# Patient Record
Sex: Female | Born: 1960 | Race: Black or African American | Hispanic: No | Marital: Single | Smoking: Current every day smoker
Health system: Southern US, Community
[De-identification: ages and names within clinical notes are randomized; demographics above are authoritative.]

## PROBLEM LIST (undated history)

## (undated) ENCOUNTER — Emergency Department (HOSPITAL_BASED_OUTPATIENT_CLINIC_OR_DEPARTMENT_OTHER)

## (undated) DIAGNOSIS — D571 Sickle-cell disease without crisis: Secondary | ICD-10-CM

## (undated) DIAGNOSIS — J449 Chronic obstructive pulmonary disease, unspecified: Secondary | ICD-10-CM

## (undated) DIAGNOSIS — I6529 Occlusion and stenosis of unspecified carotid artery: Secondary | ICD-10-CM

## (undated) DIAGNOSIS — E785 Hyperlipidemia, unspecified: Secondary | ICD-10-CM

## (undated) DIAGNOSIS — I10 Essential (primary) hypertension: Secondary | ICD-10-CM

## (undated) DIAGNOSIS — D573 Sickle-cell trait: Secondary | ICD-10-CM

## (undated) DIAGNOSIS — J302 Other seasonal allergic rhinitis: Secondary | ICD-10-CM

## (undated) DIAGNOSIS — T7840XA Allergy, unspecified, initial encounter: Secondary | ICD-10-CM

## (undated) DIAGNOSIS — I639 Cerebral infarction, unspecified: Secondary | ICD-10-CM

## (undated) DIAGNOSIS — M1990 Unspecified osteoarthritis, unspecified site: Secondary | ICD-10-CM

## (undated) HISTORY — DX: Unspecified osteoarthritis, unspecified site: M19.90

## (undated) HISTORY — DX: Allergy, unspecified, initial encounter: T78.40XA

## (undated) HISTORY — DX: Occlusion and stenosis of unspecified carotid artery: I65.29

## (undated) HISTORY — PX: ABDOMINAL HYSTERECTOMY: SHX81

## (undated) HISTORY — DX: Hyperlipidemia, unspecified: E78.5

## (undated) HISTORY — DX: Cerebral infarction, unspecified: I63.9

## (undated) HISTORY — DX: Sickle-cell disease without crisis: D57.1

## (undated) HISTORY — DX: Other seasonal allergic rhinitis: J30.2

## (undated) HISTORY — DX: Sickle-cell trait: D57.3

## (undated) HISTORY — DX: Essential (primary) hypertension: I10

---

## 1997-08-17 ENCOUNTER — Encounter: Admission: RE | Admit: 1997-08-17 | Discharge: 1997-08-17 | Payer: Self-pay | Admitting: Obstetrics

## 1998-02-01 ENCOUNTER — Encounter: Admission: RE | Admit: 1998-02-01 | Discharge: 1998-02-01 | Payer: Self-pay | Admitting: Obstetrics

## 1998-10-11 ENCOUNTER — Encounter: Admission: RE | Admit: 1998-10-11 | Discharge: 1998-10-11 | Payer: Self-pay | Admitting: Obstetrics

## 1998-12-19 ENCOUNTER — Emergency Department (HOSPITAL_COMMUNITY): Admission: EM | Admit: 1998-12-19 | Discharge: 1998-12-19 | Payer: Self-pay | Admitting: Emergency Medicine

## 1999-05-02 ENCOUNTER — Encounter: Admission: RE | Admit: 1999-05-02 | Discharge: 1999-05-02 | Payer: Self-pay | Admitting: Obstetrics

## 1999-05-19 ENCOUNTER — Inpatient Hospital Stay (HOSPITAL_COMMUNITY): Admission: AD | Admit: 1999-05-19 | Discharge: 1999-05-19 | Payer: Self-pay | Admitting: Obstetrics & Gynecology

## 1999-05-23 ENCOUNTER — Encounter: Admission: RE | Admit: 1999-05-23 | Discharge: 1999-05-23 | Payer: Self-pay | Admitting: Obstetrics

## 1999-05-30 ENCOUNTER — Ambulatory Visit (HOSPITAL_COMMUNITY): Admission: RE | Admit: 1999-05-30 | Discharge: 1999-05-30 | Payer: Self-pay | Admitting: Obstetrics & Gynecology

## 2000-01-01 ENCOUNTER — Emergency Department (HOSPITAL_COMMUNITY): Admission: EM | Admit: 2000-01-01 | Discharge: 2000-01-01 | Payer: Self-pay | Admitting: Emergency Medicine

## 2000-01-03 ENCOUNTER — Emergency Department (HOSPITAL_COMMUNITY): Admission: EM | Admit: 2000-01-03 | Discharge: 2000-01-03 | Payer: Self-pay | Admitting: Emergency Medicine

## 2000-06-30 ENCOUNTER — Encounter: Admission: RE | Admit: 2000-06-30 | Discharge: 2000-08-10 | Payer: Self-pay | Admitting: Family Medicine

## 2000-10-27 ENCOUNTER — Emergency Department (HOSPITAL_COMMUNITY): Admission: EM | Admit: 2000-10-27 | Discharge: 2000-10-27 | Payer: Self-pay | Admitting: Emergency Medicine

## 2000-10-27 ENCOUNTER — Encounter: Payer: Self-pay | Admitting: Emergency Medicine

## 2001-08-12 ENCOUNTER — Ambulatory Visit (HOSPITAL_COMMUNITY): Admission: RE | Admit: 2001-08-12 | Discharge: 2001-08-12 | Payer: Self-pay | Admitting: *Deleted

## 2001-08-12 ENCOUNTER — Encounter: Payer: Self-pay | Admitting: Obstetrics

## 2002-05-23 ENCOUNTER — Emergency Department (HOSPITAL_COMMUNITY): Admission: EM | Admit: 2002-05-23 | Discharge: 2002-05-23 | Payer: Self-pay | Admitting: Emergency Medicine

## 2002-05-23 ENCOUNTER — Encounter: Payer: Self-pay | Admitting: Emergency Medicine

## 2002-11-30 ENCOUNTER — Encounter: Admission: RE | Admit: 2002-11-30 | Discharge: 2002-11-30 | Payer: Self-pay | Admitting: Internal Medicine

## 2002-12-06 ENCOUNTER — Other Ambulatory Visit: Admission: RE | Admit: 2002-12-06 | Discharge: 2002-12-06 | Payer: Self-pay | Admitting: Obstetrics and Gynecology

## 2002-12-06 ENCOUNTER — Encounter (INDEPENDENT_AMBULATORY_CARE_PROVIDER_SITE_OTHER): Payer: Self-pay

## 2002-12-06 ENCOUNTER — Encounter: Admission: RE | Admit: 2002-12-06 | Discharge: 2002-12-06 | Payer: Self-pay | Admitting: Obstetrics and Gynecology

## 2002-12-10 ENCOUNTER — Ambulatory Visit (HOSPITAL_COMMUNITY): Admission: RE | Admit: 2002-12-10 | Discharge: 2002-12-10 | Payer: Self-pay | Admitting: Internal Medicine

## 2002-12-10 ENCOUNTER — Encounter: Payer: Self-pay | Admitting: Internal Medicine

## 2002-12-13 ENCOUNTER — Encounter: Payer: Self-pay | Admitting: Internal Medicine

## 2002-12-13 ENCOUNTER — Ambulatory Visit (HOSPITAL_COMMUNITY): Admission: RE | Admit: 2002-12-13 | Discharge: 2002-12-13 | Payer: Self-pay | Admitting: Obstetrics and Gynecology

## 2003-01-04 ENCOUNTER — Encounter: Admission: RE | Admit: 2003-01-04 | Discharge: 2003-01-04 | Payer: Self-pay | Admitting: Internal Medicine

## 2003-02-01 ENCOUNTER — Encounter: Payer: Self-pay | Admitting: Emergency Medicine

## 2003-02-01 ENCOUNTER — Inpatient Hospital Stay (HOSPITAL_COMMUNITY): Admission: EM | Admit: 2003-02-01 | Discharge: 2003-02-02 | Payer: Self-pay | Admitting: Internal Medicine

## 2003-08-02 ENCOUNTER — Emergency Department (HOSPITAL_COMMUNITY): Admission: EM | Admit: 2003-08-02 | Discharge: 2003-08-02 | Payer: Self-pay | Admitting: Emergency Medicine

## 2005-01-06 ENCOUNTER — Emergency Department (HOSPITAL_COMMUNITY): Admission: EM | Admit: 2005-01-06 | Discharge: 2005-01-06 | Payer: Self-pay | Admitting: *Deleted

## 2005-10-02 ENCOUNTER — Emergency Department (HOSPITAL_COMMUNITY): Admission: EM | Admit: 2005-10-02 | Discharge: 2005-10-03 | Payer: Self-pay | Admitting: Emergency Medicine

## 2006-01-07 ENCOUNTER — Ambulatory Visit: Payer: Self-pay | Admitting: Hospitalist

## 2006-02-18 ENCOUNTER — Ambulatory Visit (HOSPITAL_COMMUNITY): Admission: RE | Admit: 2006-02-18 | Discharge: 2006-02-18 | Payer: Self-pay | Admitting: Obstetrics and Gynecology

## 2006-05-20 ENCOUNTER — Emergency Department (HOSPITAL_COMMUNITY): Admission: EM | Admit: 2006-05-20 | Discharge: 2006-05-20 | Payer: Self-pay | Admitting: *Deleted

## 2006-07-09 ENCOUNTER — Emergency Department (HOSPITAL_COMMUNITY): Admission: EM | Admit: 2006-07-09 | Discharge: 2006-07-10 | Payer: Self-pay | Admitting: Emergency Medicine

## 2006-08-06 ENCOUNTER — Ambulatory Visit: Payer: Self-pay | Admitting: Internal Medicine

## 2006-08-06 ENCOUNTER — Encounter (INDEPENDENT_AMBULATORY_CARE_PROVIDER_SITE_OTHER): Payer: Self-pay | Admitting: Unknown Physician Specialty

## 2006-08-06 DIAGNOSIS — F329 Major depressive disorder, single episode, unspecified: Secondary | ICD-10-CM | POA: Insufficient documentation

## 2006-08-06 DIAGNOSIS — J449 Chronic obstructive pulmonary disease, unspecified: Secondary | ICD-10-CM | POA: Insufficient documentation

## 2006-10-02 ENCOUNTER — Emergency Department (HOSPITAL_COMMUNITY): Admission: EM | Admit: 2006-10-02 | Discharge: 2006-10-02 | Payer: Self-pay | Admitting: Emergency Medicine

## 2006-11-29 ENCOUNTER — Emergency Department (HOSPITAL_COMMUNITY): Admission: EM | Admit: 2006-11-29 | Discharge: 2006-11-29 | Payer: Self-pay | Admitting: Family Medicine

## 2006-12-26 ENCOUNTER — Emergency Department (HOSPITAL_COMMUNITY): Admission: EM | Admit: 2006-12-26 | Discharge: 2006-12-26 | Payer: Self-pay | Admitting: Emergency Medicine

## 2007-03-26 ENCOUNTER — Emergency Department (HOSPITAL_COMMUNITY): Admission: EM | Admit: 2007-03-26 | Discharge: 2007-03-27 | Payer: Self-pay | Admitting: Emergency Medicine

## 2007-04-22 ENCOUNTER — Encounter: Payer: Self-pay | Admitting: Internal Medicine

## 2007-04-22 ENCOUNTER — Ambulatory Visit: Payer: Self-pay | Admitting: Internal Medicine

## 2007-04-22 DIAGNOSIS — F17200 Nicotine dependence, unspecified, uncomplicated: Secondary | ICD-10-CM

## 2007-04-22 DIAGNOSIS — IMO0002 Reserved for concepts with insufficient information to code with codable children: Secondary | ICD-10-CM | POA: Insufficient documentation

## 2007-05-02 DIAGNOSIS — E785 Hyperlipidemia, unspecified: Secondary | ICD-10-CM | POA: Insufficient documentation

## 2007-05-02 LAB — CONVERTED CEMR LAB
BUN: 13 mg/dL (ref 6–23)
CO2: 23 meq/L (ref 19–32)
Chloride: 106 meq/L (ref 96–112)
Glucose, Bld: 93 mg/dL (ref 70–99)
LDL Cholesterol: 178 mg/dL — ABNORMAL HIGH (ref 0–99)
Potassium: 4.3 meq/L (ref 3.5–5.3)
VLDL: 42 mg/dL — ABNORMAL HIGH (ref 0–40)

## 2007-05-04 ENCOUNTER — Encounter: Payer: Self-pay | Admitting: Internal Medicine

## 2007-05-04 ENCOUNTER — Ambulatory Visit (HOSPITAL_COMMUNITY): Admission: RE | Admit: 2007-05-04 | Discharge: 2007-05-04 | Payer: Self-pay | Admitting: Internal Medicine

## 2007-06-03 ENCOUNTER — Ambulatory Visit: Payer: Self-pay | Admitting: Infectious Disease

## 2007-06-24 ENCOUNTER — Emergency Department (HOSPITAL_COMMUNITY): Admission: EM | Admit: 2007-06-24 | Discharge: 2007-06-24 | Payer: Self-pay | Admitting: Family Medicine

## 2007-08-06 ENCOUNTER — Ambulatory Visit: Payer: Self-pay | Admitting: *Deleted

## 2007-08-06 DIAGNOSIS — M6530 Trigger finger, unspecified finger: Secondary | ICD-10-CM | POA: Insufficient documentation

## 2007-08-06 DIAGNOSIS — M25559 Pain in unspecified hip: Secondary | ICD-10-CM | POA: Insufficient documentation

## 2010-01-14 ENCOUNTER — Ambulatory Visit: Payer: Self-pay | Admitting: Internal Medicine

## 2010-01-14 DIAGNOSIS — N898 Other specified noninflammatory disorders of vagina: Secondary | ICD-10-CM | POA: Insufficient documentation

## 2010-01-21 DIAGNOSIS — E871 Hypo-osmolality and hyponatremia: Secondary | ICD-10-CM | POA: Insufficient documentation

## 2010-01-21 LAB — CONVERTED CEMR LAB
ALT: 10 units/L (ref 0–35)
AST: 13 units/L (ref 0–37)
Albumin: 4.3 g/dL (ref 3.5–5.2)
Calcium: 9.5 mg/dL (ref 8.4–10.5)
Chloride: 93 meq/L — ABNORMAL LOW (ref 96–112)
HDL: 42 mg/dL (ref >39)
MCHC: 33.9 g/dL (ref 30.0–36.0)
Platelets: 305 10*3/uL (ref 150–400)
Potassium: 3.7 meq/L (ref 3.5–5.3)
RDW: 14.4 % (ref 11.5–15.5)
Sodium: 127 meq/L — ABNORMAL LOW (ref 135–145)
Triglycerides: 159 mg/dL — ABNORMAL HIGH (ref <150)

## 2010-02-20 ENCOUNTER — Ambulatory Visit (HOSPITAL_COMMUNITY)
Admission: RE | Admit: 2010-02-20 | Discharge: 2010-02-20 | Payer: Self-pay | Source: Home / Self Care | Attending: Internal Medicine | Admitting: Internal Medicine

## 2010-03-02 ENCOUNTER — Telehealth: Payer: Self-pay | Admitting: Internal Medicine

## 2010-03-04 ENCOUNTER — Ambulatory Visit
Admission: RE | Admit: 2010-03-04 | Discharge: 2010-03-04 | Payer: Self-pay | Source: Home / Self Care | Attending: Internal Medicine | Admitting: Internal Medicine

## 2010-03-04 LAB — CONVERTED CEMR LAB
CO2: 23 meq/L (ref 19–32)
Calcium: 9.6 mg/dL (ref 8.4–10.5)
Chloride: 108 meq/L (ref 96–112)
Creatinine, Ser: 0.73 mg/dL (ref 0.40–1.20)
Glucose, Bld: 77 mg/dL (ref 70–99)
Sodium: 140 meq/L (ref 135–145)
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
## 2010-03-26 NOTE — Letter (Signed)
Summary: MAMMOGRAM SCREENING  MAMMOGRAM SCREENING   Imported By: Margie Billet 05/22/2009 10:58:00  _____________________________________________________________________  External Attachment:    Type:   Image     Comment:   External Document  Appended Document: MAMMOGRAM SCREENING    Clinical Lists Changes  Observations: Added new observation of MAMMOGRAM: No specific mammographic evidence of malignancy.  Assessment: BIRADS 1.  (05/13/2002 17:38)       Mammogram  Procedure date:  05/13/2002  Findings:      No specific mammographic evidence of malignancy.  Assessment: BIRADS 1.    Mammogram  Procedure date:  05/13/2002  Findings:      No specific mammographic evidence of malignancy.  Assessment: BIRADS 1.

## 2010-03-28 NOTE — Progress Notes (Signed)
  Phone Note Outgoing Call   Summary of Call: Call her about her normal mammogram. Also asked her to go to [facility] for BMET because she has hyponatremia in 12/2009. She said she will go to lab next Monday and no any c/o. She also run out of her statin and wants to refill. Initial call taken by: Jackson Latino MD,  March 02, 2010 6:35 PM    Prescriptions: ZOCOR 20 MG  TABS (SIMVASTATIN) Take 1 tablet by mouth once a day  #30 x 11   Entered and Authorized by:   Jackson Latino MD   Signed by:   Jackson Latino MD on 03/02/2010   Method used:   Electronically to        [facility] (retail)       [address]       [place], [place]  [address]       Ph: [phone]       Fax: [phone]   RxID:   [id number]

## 2010-03-28 NOTE — Miscellaneous (Signed)
Summary: Orders Update  Clinical Lists Changes  Orders: Added new Test order of T-Basic Metabolic Panel [phone]) - Signed     Process Orders Check Orders Results:     [facility]: ABN not required for this insurance Order queued for requisitioning for [facility]: March 04, 2010 11:30 AM Tests Sent for requisitioning (March 04, 2010 10:13 PM):     03/04/2010: [facility] -- T-Basic Metabolic Panel [phone] (signed)

## 2010-03-29 ENCOUNTER — Telehealth: Payer: Self-pay | Admitting: *Deleted

## 2010-03-29 NOTE — Telephone Encounter (Signed)
Pt calls and states she thinks she is having a reaction to her cholesterol medicine zocor, she states for 2 days that she has had muscle aches and a fine rash over her body. She is advised to go to urg care for eval asap. She states she is stopping the med and she will go to [place] [facility].

## 2010-04-01 NOTE — Telephone Encounter (Signed)
Hi [name],   I agreed with the plan for her to go to urgent care and stop zocor. Please make her an appointment sooner in [facility] for further evaluation.   Thanks

## 2010-04-26 ENCOUNTER — Encounter: Payer: Self-pay | Admitting: Internal Medicine

## 2010-05-02 ENCOUNTER — Ambulatory Visit (INDEPENDENT_AMBULATORY_CARE_PROVIDER_SITE_OTHER): Payer: Self-pay

## 2010-05-02 ENCOUNTER — Inpatient Hospital Stay (INDEPENDENT_AMBULATORY_CARE_PROVIDER_SITE_OTHER)
Admission: RE | Admit: 2010-05-02 | Discharge: 2010-05-02 | Disposition: A | Discharge status: Home / Self Care | Payer: Self-pay | Source: Ambulatory Visit | Attending: Family Medicine | Admitting: Family Medicine

## 2010-05-02 DIAGNOSIS — IMO0002 Reserved for concepts with insufficient information to code with codable children: Secondary | ICD-10-CM

## 2010-05-08 ENCOUNTER — Ambulatory Visit (INDEPENDENT_AMBULATORY_CARE_PROVIDER_SITE_OTHER): Payer: Self-pay | Admitting: Internal Medicine

## 2010-05-08 ENCOUNTER — Encounter: Payer: Self-pay | Admitting: Internal Medicine

## 2010-05-08 VITALS — BP 127/80 | HR 65 | Temp 97.3°F | Ht 65.0 in | Wt 183.6 lb

## 2010-05-08 DIAGNOSIS — E785 Hyperlipidemia, unspecified: Secondary | ICD-10-CM

## 2010-05-08 DIAGNOSIS — J449 Chronic obstructive pulmonary disease, unspecified: Secondary | ICD-10-CM

## 2010-05-08 DIAGNOSIS — Z0000 Encounter for general adult medical examination without abnormal findings: Secondary | ICD-10-CM | POA: Insufficient documentation

## 2010-05-08 MED ORDER — ASPIRIN EC 81 MG PO TBEC: 81.0000 mg | DELAYED_RELEASE_TABLET | Freq: Every day | ORAL | Status: DC

## 2010-05-08 MED ORDER — PRAVASTATIN SODIUM 20 MG PO TABS: 20.0000 mg | ORAL_TABLET | Freq: Every evening | ORAL | Status: DC

## 2010-05-08 NOTE — Progress Notes (Signed)
  Subjective:    Patient ID: [name], female    DOB: 03/09/1960, 50 y.o.   MRN: [id number]  HPI  Pt is a 50 yo AAF with PMH of HLD and COPD who came here for regular f/u. She has HLD with LDL 221 in 12/2009 and started zocor and she developed rashes in her four extremities after taking 4 weeks, also had muscle tightness, so she stopped it and the rash resolved in 2 weeks. Now she has no symptoms, including weakness, muscle pain or abdominal pain. She also wants to get screening colonoscopy and will have Pap smear next month at [facility] hospital.    Review of Systems Per HPI.  Current Outpatient Prescriptions  Medication Sig Dispense Refill  . albuterol (VENTOLIN HFA) 108 (90 BASE) MCG/ACT inhaler Inhale into the lungs every 4 (four) hours as needed.        . simvastatin (ZOCOR) 20 MG tablet Take 20 mg by mouth daily.          Allergies Metronidazole  No past medical history on file.  Past Surgical History  Procedure Date  . Abdominal hysterectomy        Objective:   Physical Exam General: Vital signs reviewed and noted. Well-developed,well-nourished,in no acute distress; alert,appropriate and cooperative throughout examination. Head: normocephalic, atraumatic. Neck: No deformities, masses, or tenderness noted. Lungs: Normal respiratory effort. Clear to auscultation BL without crackles or wheezes.  Heart: RRR. S1 and S2 normal without gallop, murmur, or rubs.  Abdomen: BS normoactive. Soft, Nondistended, non-tender.  No masses or organomegaly. Extremities: No pretibial edema.         Assessment & Plan:

## 2010-05-08 NOTE — Assessment & Plan Note (Addendum)
Her Pap smear is due and will do it in [facility] hospital soon. Will will have GI referral for screening colonoscopy. Start ASA for prevention.

## 2010-05-08 NOTE — Assessment & Plan Note (Signed)
She has side effects to zocor and will try low dose pravastatin and also advised her to exercise and weight loss as well as healthy diet. She is interested to do it. Will recheck FLP in 3-4 months if tolerates pravastatin.

## 2010-05-08 NOTE — Assessment & Plan Note (Signed)
Has no SOB and just use it as needed.

## 2010-05-08 NOTE — Patient Instructions (Signed)
Please take all your medications as instructed in your instructions.   Please call the Clinic for appointment if you have rashes or muscle pain to pravastatin.

## 2010-06-05 ENCOUNTER — Encounter: Payer: Self-pay | Admitting: Obstetrics and Gynecology

## 2010-06-05 DIAGNOSIS — Z01419 Encounter for gynecological examination (general) (routine) without abnormal findings: Secondary | ICD-10-CM

## 2010-06-05 DIAGNOSIS — IMO0002 Reserved for concepts with insufficient information to code with codable children: Secondary | ICD-10-CM

## 2010-06-06 NOTE — Progress Notes (Signed)
[name], [name] NO.:  [id number]  MEDICAL RECORD NO.:  [id number]           PATIENT TYPE:  A  LOCATION:  [facility]                   FACILITY:  [facility]  PHYSICIAN:  Trystian Crisanto Christy Gentles, CNM       DATE OF BIRTH:  1960-12-17  DATE OF SERVICE:  06/05/2010                                 CLINIC NOTE  REASON FOR VISIT:  Annual GYN exam.  HISTORY:  This is the initial visit at our clinic for 50 year old, G4, P1-0-3-1, who is post hysterectomy done in 1998 for fibroids and has a chief concern today of dyspareunia with some scant bright red blood after intercourse with condoms used.  She has not been sexually active for the matter of years until she has a recent new partner and has had a few instances of bleeding after intercourse.  She never needs to wear a pad or has persistent bleeding.  She has no pain or itching.  No abnormal or malodorous discharge.  She does say that she has vaginal dryness and has been using [place] with a condoms for that reason.  ALLERGIES:  FLAGYL.  CURRENT MEDICATIONS:  None except occasional Aleve and she does take a fish oil capsule every day.  She has calcium, but does not take it.  HEALTH CARE MAINTENANCE:  Tetanus and immunizations up-to-date. Mammogram was normal February 2012.  She has not had a colonoscopy yet, but her PMD which is [facility] has recommended one and will be scheduling once.  She is not getting regular dental care, but does not have concerns and does wash and brush teeth daily. She had a hysterectomy and left oophorectomy in 1998.  She states that was for fibroids and there is no cancer.  She had one term SVD and 3 SABs.  She denies any history of STIs and is unaware of having ever had ovarian cysts.  Her PMH is significant for hypercholesterolemia and she denies any history of hypertension.  SURGERY:  Hysterectomy as above.  SOCIAL HISTORY:  She has been unemployed for 2 years and is  stressed about that.  She lives alone.  She is a smoker and has 15-pack years and is somewhat motivated to quit.  She denies alcohol, any drug use, any history of IV drug use or other drugs and no history of abuse.  She has had one partner in the last year.  FAMILY HISTORY:  Positive for mother with diabetes, coronary vascular disease and hypertension as well as blood clots in legs.  REVIEW OF SYSTEMS:  Positive for some hot flashes, some palpitations, muscle and joint pain and as described above the dyspareunia.  PHYSICAL EXAMINATION:  VITAL SIGNS:  Temp 99, pulse 74, BP 137/93, recheck 148/82, weight 180, height 5 feet 5 inches. GENERAL:  WN, WD, pleasant, talkative AAF and NAD. HEAD:  Normocephalic.  Good dentition. NECK:  No thyromegaly.  Coronary RRR without murmur. LUNGS:  CTA bilateral. ABDOMEN:  Protuberant, soft, nontender and no masses.  Liver edge seems to be palpable at costal margins. PELVIC:  Normal external genitalia.  No lesions.  No bleeding noted. Mucosa does appear mildly atrophic  with pale pink coloration and very smooth without rugation's.  No lesions seen.  There is a minimal amount of inflammation at the cuff, but no granulation or lesion is noted. There is no bleeding with the speculum exam.  There is scant amount of physiologic white discharge and wet prep is sent.  Bimanual, no palpable lesions.  Unable to palpate right ovary due to body habitus, but there is no obvious masses or tenderness on exam.  IMPRESSION AND PLAN: 1. Obesity.  I discussed weight loss and diet and exercise. 2. Smoker.  I discussed smoking cessation and she is being referred to     [employer] at [facility] on smoking cessation     which includes nutrition and says she is interested in attending.     She is advised to start taking her calcium and she will be     following up with her primary physician about colonoscopy and other     routine health care maintenance  such as her cholesterol.          ______________________________ Caren Griffins, CNM    DP/MEDQ  D:  06/05/2010  T:  06/06/2010  Job:  [id number]

## 2010-07-12 NOTE — Group Therapy Note (Signed)
   [name], [name] NO.:  [id number]   MEDICAL RECORD NO.:  [id number]                   PATIENT TYPE:  OUT   LOCATION:  [facility]                           FACILITY:  [facility]   PHYSICIAN:  Argentina Donovan, MD                     DATE OF BIRTH:  04/02/1960   DATE OF SERVICE:  12/06/2002                                    CLINIC NOTE   HISTORY OF PRESENT ILLNESS:  The patient is a 50 year old black female  gravida 3, para 1-0-2-1 who had a hysterectomy and left salpingo-  oophorectomy in 1998 and has not had an examination since that time.  She is  in good health.  Is on no medication.  Has a normal blood pressure 106/68  and has no complaints.   PHYSICAL EXAMINATION:  BREASTS:  Symmetrical.  No masses.  NECK:  Thyroid is symmetrical with no masses.  PELVIC:  External genitalia is normal.  BUS within normal limits.  The  vagina is clean and well rugated.  The cervix is absent.  Pap smear is  taken.  Bimanual pelvic:  The ovary could not be palpated because of habitus  of patient.  SKIN:  The patient also had an irritating skin tag on the right lateral  chest wall which was injected underneath with a 1% Xylocaine and removed  with small bleeding point coagulated with silver nitrate and the tag sent  for pathologic diagnosis.   ASSESSMENT/PLAN:  The patient has been counseled to get a mammogram which is  scheduled in one week.  She does not need a Pap smear any longer as she did  not have hysterectomy because of cancer and she has been encouraged to get  her annual examinations to check on the one remaining ovary.                                               Argentina Donovan, MD    PR/MEDQ  D:  12/06/2002  T:  12/06/2002  Job:  [id number]

## 2010-07-12 NOTE — Discharge Summary (Signed)
[name], [name] NO.:  [id number]   MEDICAL RECORD NO.:  [id number]                   PATIENT TYPE:  INP   LOCATION:  5705                                 FACILITY:  [facility]   PHYSICIAN:  Blanch Media, M.D.             DATE OF BIRTH:  Feb 11, 1961   DATE OF ADMISSION:  02/01/2003  DATE OF DISCHARGE:  02/02/2003                                 DISCHARGE SUMMARY   PRIMARY CARE PHYSICIAN:  Laurell Roof, M.D., the [facility].   DISCHARGE DIAGNOSIS:  Acute pyelonephritis.   DISCHARGE MEDICATIONS:  Cipro 500 mg one tablet p.o. b.i.d. x 7 days.   CONDITION AT DISCHARGE:  Stable.   BRIEF ADMITTING HISTORY:  This is a 50 year old African American female with  no significant past medical history, who reports sharp left lower quadrant  and left back pain starting suddenly at 4 a.m. on the day of admission,  waking her up from sleep.  She was transported to the [facility] and had a CT scan of the abdomen done there.  Because her primary  care physician is at [facility], she was transferred to [facility] and  admitted to the floor.  The patient had been afebrile, but reported emesis  in the morning.  No prior nausea or vomiting.  No fevers or chills.  She  denied dysuria, but reported pressure on her bladder for three days.  No  hematuria.  No melena.  No hematochezia.  She reported unprotected  intercourse four nights before admission.   ALLERGIES:  1. FLAGYL.  2. EGGS.   PAST MEDICAL HISTORY:  1. Low back pain.  2. Reflux.  3. Constipation.  4. History of multiple UTIs as a child.  5. Positive for sickle cell trait.  6. Status post hysterectomy in 1998.   MEDICATIONS:  Aleve p.r.n.   SOCIAL HISTORY:  Current smoker of half of a pack per day for 28 years.  Occasional alcohol use.  No substance abuse.  The patient is single and  works as a Scientist, physiological at [employer].   FAMILY HISTORY:  Her mother had  diabetes, hypertension, and a history of  clots.  Her father died in an MCV at a young age.  She has two healthy  sisters and a 66 year old daughter who is also positive for sickle cell  trait.   REVIEW OF SYSTEMS:  As per HPI.   PHYSICAL EXAMINATION:  VITAL SIGNS:  On admission, temperature 97.5 degrees,  pulse 65, blood pressure 91/37, respirations 20, O2 saturation 92% on room  air.  GENERAL APPEARANCE:  No apparent distress.  HEENT:  The moist mucous membranes are moist.  The throat was clear.  PULMONARY:  The lungs are clear to auscultation bilaterally.  Good  respiratory effort.  HEART:  Regular rate and rhythm.  No murmurs, rubs, or gallops.  Pulses were  2+ and equal in the  upper and lower extremities.  ABDOMEN:  The abdomen was soft.  Decreased bowel sounds.  Mildly tender to  palpation in the left lower quadrant.  No flank pain.  No masses.  No  hepatosplenomegaly.  Mild suprapubic pain.  EXTREMITIES:  No cyanosis, clubbing, or edema.  Nontender.  GENITOURINARY:  The patient had mild discharge and mild left adnexal  tenderness.  SKIN:  Clear.  BACK:  The patient had left-sided lower back pain.  No lymphadenopathy.   ADMISSION LABORATORY DATA:  Sodium 140, potassium 3.7, chloride 110,  bicarbonate 25, BUN 7, creatinine 0.8, glucose 112.  White blood count 17.3,  hemoglobin 13.5, platelets 326.  ANC was 15.9 and MCV 86.  The UA was  cloudy, positive for blood, positive for nitrites, positive for leukocytes,  and positive for protein.  There were 21-50 white blood cells, 21-50 red  blood cells, many bacteria, and clumped white blood cells.  CT of the  abdomen showed no signs of diverticulitis, multiple diverticula, no renal  calculi, and no signs of hydronephrosis.   HOSPITAL COURSE:  The patient was admitted with suspected pyelonephritis and  started on p.o. ciprofloxacin after the patient's nausea and vomiting were  controlled with some Phenergan.  The patient did well.   She remained  afebrile overnight and was discharged in the morning with followup with her  primary care doctor.   DISCHARGE LABORATORY DATA:  Sodium 136, potassium 3.3, chloride 109,  bicarbonate 25, BUN 7, creatinine 0.8, glucose 98, calcium 8.4.  White blood  cell count 8.2, hemoglobin 11.7, hematocrit 34.8, platelet count 257.  Chlamydia probe was negative.  HIV antibody was nonreactive.      Lonia Blood, M.D.                         Blanch Media, M.D.    LG/MEDQ  D:  02/03/2003  T:  02/04/2003  Job:  [id number]

## 2010-07-16 ENCOUNTER — Ambulatory Visit (AMBULATORY_SURGERY_CENTER): Payer: Self-pay | Admitting: *Deleted

## 2010-07-16 VITALS — Ht 65.0 in | Wt 179.0 lb

## 2010-07-16 DIAGNOSIS — Z1211 Encounter for screening for malignant neoplasm of colon: Secondary | ICD-10-CM

## 2010-07-16 MED ORDER — PEG-KCL-NACL-NASULF-NA ASC-C 100 G PO SOLR: ORAL | Status: DC

## 2010-07-31 ENCOUNTER — Encounter: Payer: Self-pay | Admitting: Gastroenterology

## 2010-07-31 ENCOUNTER — Ambulatory Visit (AMBULATORY_SURGERY_CENTER): Payer: Self-pay | Admitting: Gastroenterology

## 2010-07-31 VITALS — BP 114/72 | HR 70 | Temp 97.9°F | Resp 16 | Ht 66.0 in | Wt 179.0 lb

## 2010-07-31 DIAGNOSIS — D126 Benign neoplasm of colon, unspecified: Secondary | ICD-10-CM

## 2010-07-31 DIAGNOSIS — Z1211 Encounter for screening for malignant neoplasm of colon: Secondary | ICD-10-CM

## 2010-07-31 DIAGNOSIS — K5730 Diverticulosis of large intestine without perforation or abscess without bleeding: Secondary | ICD-10-CM

## 2010-07-31 MED ORDER — SODIUM CHLORIDE 0.9 % IV SOLN: 500.0000 mL | INTRAVENOUS | Status: DC

## 2010-07-31 NOTE — Patient Instructions (Signed)
DISCHARGE INSTRUCTIONS REVIEWED WITH CAREPARTNER. SEE BLUE & GREEN SHEETS. INFORMATION ON POLYPS ,DIVERTICULOSIS, AND HIGH FIBER DIET GIVEN.

## 2010-08-01 ENCOUNTER — Telehealth: Payer: Self-pay | Admitting: *Deleted

## 2010-08-01 NOTE — Telephone Encounter (Signed)

## 2010-08-21 ENCOUNTER — Ambulatory Visit (INDEPENDENT_AMBULATORY_CARE_PROVIDER_SITE_OTHER): Payer: Self-pay | Admitting: Internal Medicine

## 2010-08-21 ENCOUNTER — Other Ambulatory Visit: Payer: Self-pay | Admitting: Internal Medicine

## 2010-08-21 ENCOUNTER — Ambulatory Visit (HOSPITAL_COMMUNITY)
Admission: RE | Admit: 2010-08-21 | Discharge: 2010-08-21 | Disposition: A | Discharge status: Home / Self Care | Payer: Self-pay | Source: Ambulatory Visit | Attending: Internal Medicine | Admitting: Internal Medicine

## 2010-08-21 ENCOUNTER — Encounter: Payer: Self-pay | Admitting: Internal Medicine

## 2010-08-21 DIAGNOSIS — Z0000 Encounter for general adult medical examination without abnormal findings: Secondary | ICD-10-CM

## 2010-08-21 DIAGNOSIS — E871 Hypo-osmolality and hyponatremia: Secondary | ICD-10-CM

## 2010-08-21 DIAGNOSIS — M25551 Pain in right hip: Secondary | ICD-10-CM

## 2010-08-21 DIAGNOSIS — M25559 Pain in unspecified hip: Secondary | ICD-10-CM

## 2010-08-21 DIAGNOSIS — E785 Hyperlipidemia, unspecified: Secondary | ICD-10-CM

## 2010-08-21 LAB — BASIC METABOLIC PANEL
Chloride: 106 mEq/L (ref 96–112)
Creat: 0.73 mg/dL (ref 0.50–1.10)
Potassium: 4.3 mEq/L (ref 3.5–5.3)

## 2010-08-21 LAB — LIPID PANEL
HDL: 39 mg/dL — ABNORMAL LOW (ref >39)
LDL Cholesterol: 216 mg/dL — ABNORMAL HIGH (ref 0–99)
Triglycerides: 209 mg/dL — ABNORMAL HIGH (ref <150)
VLDL: 42 mg/dL — ABNORMAL HIGH (ref 0–40)

## 2010-08-21 NOTE — Assessment & Plan Note (Signed)
Patient has history of hyperlipidemia. Last lipid panel in 12/2009. Patient was started on Zocor but she experienced significant allergy to that. She was asked to start pravastatin which did not. She is currently taking fish-oil tablet. She noted she would like to try with exercise. She is doing 2-3 times a week 30 minutes session of exercise. Will check lipid panel today for possibly change in management

## 2010-08-21 NOTE — Assessment & Plan Note (Signed)
Unclear etiology at this point. Patient has been evaluated in the past for bilateral hip pain. At that time normal lab work or imaging studies were performed. Conservative therapy was initiated. I will obtain today a right hip x-ray and refer the patient to sports medicine for further evaluation and management. No indication for pain medication at this point and patient is also reluctant to use long-term pain medication.

## 2010-08-21 NOTE — Assessment & Plan Note (Signed)
#  1 Patient's last mammogram was in 2010. This showed stable benign left breast nodule. No evidence of malignancy. Recommendation was to a mammogram in one year. We'll repeat mammogram today. #2 Pap smear: Patient had hysterectomy done. Nevertheless she was asked Pap smear done at the last office visit. She reports that she has been seen at the [facility]. She may have Pap smear. But the red ear and the chart after the patient left she did not receive a Pap smear. At the next office visit this should be reevaluated.

## 2010-08-21 NOTE — Progress Notes (Signed)
  Subjective:    Patient ID: [name], female    DOB: 05-29-1960, 50 y.o.   MRN: [id number]  HPI This is a 50 year old female with past medical history significant for hyperlipidemia, COPD presented to the clinic with right hip pain. The right hip pain started off couple of months ago it is at this point worsening. If it is an aching pain. In the morning she feels stiffness. Whenever she is sitting or standing for long time the pain is worse. After walking and doing exercise the pain improves but does not disappear. Occasionally she has to stop doing her exercise due to the pain. She reports that she had hip pain in the past never experienced it this bad. She denies any fevers or chills, trauma, insect bites. Denies any weakness, numbness or tingling. She noted that she would like to be checked for diabetes since she has strong history of diabetes in her family.   Review of Systems  Constitutional: Negative for fever, chills and fatigue.  Respiratory: Negative for chest tightness, shortness of breath and wheezing.   Cardiovascular: Negative for chest pain and palpitations.  Gastrointestinal: Negative for abdominal pain and abdominal distention.  Genitourinary: Negative for difficulty urinating.  Musculoskeletal: Positive for back pain, arthralgias and gait problem.  Neurological: Negative for dizziness, weakness and numbness.       Objective:   Physical Exam  Constitutional: She is oriented to person, place, and time. She appears well-developed.  HENT:  Head: Normocephalic.  Neck: Neck supple.  Cardiovascular: Normal rate and regular rhythm.   Pulmonary/Chest: Effort normal and breath sounds normal. No respiratory distress.  Abdominal: Soft. Bowel sounds are normal. She exhibits no distension.  Musculoskeletal:       Right hip: She exhibits decreased range of motion, tenderness, bony tenderness and swelling.       Legs: Neurological: She is alert and oriented to person, place,  and time. She has normal strength and normal reflexes. No cranial nerve deficit or sensory deficit. Gait normal.  Skin: No rash noted.          Assessment & Plan:

## 2010-08-22 ENCOUNTER — Telehealth: Payer: Self-pay | Admitting: Internal Medicine

## 2010-08-22 DIAGNOSIS — E785 Hyperlipidemia, unspecified: Secondary | ICD-10-CM

## 2010-08-22 MED ORDER — PRAVASTATIN SODIUM 20 MG PO TABS: 20.0000 mg | ORAL_TABLET | Freq: Every day | ORAL | Status: DC

## 2010-08-22 NOTE — Telephone Encounter (Signed)
Elevated Lipid noted. Will start pravastatin. Evaluate at the next office visit LFT and possible side effect since patient is allergic to Zocor.

## 2010-08-22 NOTE — Assessment & Plan Note (Signed)
Elevated Lipid noted. Will start pravastatin. Evaluate at the next office visit LFT and possible side effect since patient is allergic to Zocor.   Lab Results  Component Value Date   CHOL 297* 08/21/2010   CHOL 295* 01/14/2010   CHOL 254* 04/22/2007   Lab Results  Component Value Date   HDL 39* 08/21/2010   HDL 42 01/14/2010   HDL 34* 04/22/2007   Lab Results  Component Value Date   LDLCALC 216* 08/21/2010   LDLCALC 221* 01/14/2010   LDLCALC 178* 04/22/2007   Lab Results  Component Value Date   TRIG 209* 08/21/2010   TRIG 159* 01/14/2010   TRIG 210* 04/22/2007   Lab Results  Component Value Date   CHOLHDL 7.6 08/21/2010   CHOLHDL 7.0 Ratio 01/14/2010   CHOLHDL 7.5 Ratio 04/22/2007   No results found for this basename: LDLDIRECT

## 2010-09-03 ENCOUNTER — Ambulatory Visit: Payer: Self-pay | Admitting: Family Medicine

## 2010-09-04 ENCOUNTER — Telehealth: Payer: Self-pay | Admitting: *Deleted

## 2010-09-04 NOTE — Telephone Encounter (Signed)
Pt informed and will restart med.   Appointment made for next week in case sore throat is not resolved.  Pt will cancel if better.

## 2010-09-04 NOTE — Telephone Encounter (Signed)
LDL was 216 last month. She most definitely needs to resume the statin bc I think the benefits out weigh the possible risk. We have seen a lot of viral URI's recently so there is a "bug" going around. I suspect she got a virus rather than a side effect to statin.

## 2010-09-04 NOTE — Telephone Encounter (Signed)
Pt called stating she stared taking pravastatin  Last Monday 7/2, on Tuesday she developed a sore throat and flu like symptoms. She stopped pravastatin for a few days but still having a sore throat. Flu like symptom have gone away but she still has the sore throat.   She has been taking OTC pain med every day so is not sure if She would have a fever.  Should she stop pravastatin, she has taken Zocor in past but had side effects - 05/08/10. Please advise.

## 2010-09-06 ENCOUNTER — Encounter: Payer: Self-pay | Admitting: Family Medicine

## 2010-09-06 ENCOUNTER — Ambulatory Visit (INDEPENDENT_AMBULATORY_CARE_PROVIDER_SITE_OTHER): Payer: Self-pay | Admitting: Family Medicine

## 2010-09-06 VITALS — BP 130/84 | Ht 65.0 in | Wt 179.0 lb

## 2010-09-06 DIAGNOSIS — M25559 Pain in unspecified hip: Secondary | ICD-10-CM

## 2010-09-06 NOTE — Progress Notes (Signed)
  Subjective:    Patient ID: [name], female    DOB: 31-Oct-1960, 50 y.o.   MRN: [id number]  HPI  Right hip pain off and on for several years that has gotten significantly worse in the last 2 months. She has done a lot of extra walking in the last 2 months for exercise program. She is a Physicist, medical ill for college.  Pain is in the lateral hip and seems to radiate down the leg to right below her knee. It is worse after a lot of walking; after sitting for a while it he  is fairly stiff. Pain seems her from falling asleep easily at night. It is somewhat better in the morning.  PERTINENT  PMH / PSH: No prior hip injury or hip surgery. She has had some hip x-rays.  Review of Systems Denies fever or unusual weight change.    Objective:   Physical Exam    GENERAL: Well-developed female no acute distress HIP: Some decrease in internal rotation bilaterally with minimal pain at the far range of motion. External rotation is full. Hip flexor strength bilaterally is normal. Distally she is neurovascularly intact. She has normal muscle bulk and tone in the quadriceps. Negative straight leg raise bilaterally. Negative Trendelenburg. Some tenderness to palpation over the right greater trochanteric bursa and this reproduces some of her pain.  X-rays: Full views of the right hip plus pelvis x-ray were reviewed. She has some early arthritic changes with some osteophytes and sclerosis. There may be the beginning of formation of some acetabular cysts. The hip joint space appears to be maintained.  INJECTION: Patient was given informed consent, signed copy in the chart. Appropriate time out was taken. Area prepped and draped in usual sterile fashion. 1cc of kenalog plus  4 cc of lidocaine was injected into the right greater trochanteric bursa using a(n) lateral approach. The patient tolerated the procedure well. There were no complications. Post procedure instructions were given.     Assessment &  Plan:  Hip pain. I think this is multifactorial. She has some early arthritic change with some loss of internal range of motion. I suspect this is been going on for long while he think the more recent pain is related to greater trochanteric bursitis which we injected today. I will put her on a hip strengthening program and see her back in 3 weeks.

## 2010-09-08 ENCOUNTER — Emergency Department (HOSPITAL_COMMUNITY): Payer: Self-pay

## 2010-09-08 ENCOUNTER — Encounter: Payer: Self-pay | Admitting: Internal Medicine

## 2010-09-08 ENCOUNTER — Inpatient Hospital Stay (HOSPITAL_COMMUNITY)
Admission: EM | Admit: 2010-09-08 | Discharge: 2010-09-12 | DRG: 066 | Disposition: A | Discharge status: Home / Self Care | Payer: Self-pay | Attending: Neurology | Admitting: Neurology

## 2010-09-08 DIAGNOSIS — Y92009 Unspecified place in unspecified non-institutional (private) residence as the place of occurrence of the external cause: Secondary | ICD-10-CM

## 2010-09-08 DIAGNOSIS — I609 Nontraumatic subarachnoid hemorrhage, unspecified: Principal | ICD-10-CM | POA: Diagnosis present

## 2010-09-08 DIAGNOSIS — E876 Hypokalemia: Secondary | ICD-10-CM | POA: Diagnosis present

## 2010-09-08 DIAGNOSIS — K5900 Constipation, unspecified: Secondary | ICD-10-CM | POA: Diagnosis present

## 2010-09-08 DIAGNOSIS — J4489 Other specified chronic obstructive pulmonary disease: Secondary | ICD-10-CM | POA: Diagnosis present

## 2010-09-08 DIAGNOSIS — Z833 Family history of diabetes mellitus: Secondary | ICD-10-CM

## 2010-09-08 DIAGNOSIS — Z8249 Family history of ischemic heart disease and other diseases of the circulatory system: Secondary | ICD-10-CM

## 2010-09-08 DIAGNOSIS — I700 Atherosclerosis of aorta: Secondary | ICD-10-CM | POA: Diagnosis present

## 2010-09-08 DIAGNOSIS — D72829 Elevated white blood cell count, unspecified: Secondary | ICD-10-CM | POA: Diagnosis present

## 2010-09-08 DIAGNOSIS — T380X5A Adverse effect of glucocorticoids and synthetic analogues, initial encounter: Secondary | ICD-10-CM | POA: Diagnosis present

## 2010-09-08 DIAGNOSIS — R109 Unspecified abdominal pain: Secondary | ICD-10-CM | POA: Diagnosis present

## 2010-09-08 DIAGNOSIS — D573 Sickle-cell trait: Secondary | ICD-10-CM | POA: Diagnosis present

## 2010-09-08 DIAGNOSIS — I10 Essential (primary) hypertension: Secondary | ICD-10-CM | POA: Diagnosis present

## 2010-09-08 DIAGNOSIS — J449 Chronic obstructive pulmonary disease, unspecified: Secondary | ICD-10-CM | POA: Diagnosis present

## 2010-09-08 DIAGNOSIS — F17200 Nicotine dependence, unspecified, uncomplicated: Secondary | ICD-10-CM | POA: Diagnosis present

## 2010-09-08 DIAGNOSIS — I498 Other specified cardiac arrhythmias: Secondary | ICD-10-CM | POA: Diagnosis present

## 2010-09-08 DIAGNOSIS — E7800 Pure hypercholesterolemia, unspecified: Secondary | ICD-10-CM | POA: Diagnosis present

## 2010-09-08 DIAGNOSIS — K219 Gastro-esophageal reflux disease without esophagitis: Secondary | ICD-10-CM | POA: Diagnosis present

## 2010-09-08 DIAGNOSIS — F1210 Cannabis abuse, uncomplicated: Secondary | ICD-10-CM | POA: Diagnosis present

## 2010-09-08 DIAGNOSIS — E669 Obesity, unspecified: Secondary | ICD-10-CM | POA: Diagnosis present

## 2010-09-08 LAB — URINALYSIS, ROUTINE W REFLEX MICROSCOPIC
Glucose, UA: NEGATIVE mg/dL
Ketones, ur: NEGATIVE mg/dL
Protein, ur: 100 mg/dL — AB

## 2010-09-08 LAB — CARDIAC PANEL(CRET KIN+CKTOT+MB+TROPI): Troponin I: <0.3 ng/mL (ref <0.30)

## 2010-09-08 LAB — DIFFERENTIAL
Eosinophils Relative: 0 % (ref 0–5)
Lymphs Abs: 4.7 10*3/uL — ABNORMAL HIGH (ref 0.7–4.0)
Monocytes Relative: 4 % (ref 3–12)
Neutro Abs: 15.1 10*3/uL — ABNORMAL HIGH (ref 1.7–7.7)

## 2010-09-08 LAB — COMPREHENSIVE METABOLIC PANEL
ALT: 25 U/L (ref 0–35)
AST: 22 U/L (ref 0–37)
CO2: 21 mEq/L (ref 19–32)
Chloride: 104 mEq/L (ref 96–112)
GFR calc non Af Amer: >60 mL/min (ref >60)
Sodium: 141 mEq/L (ref 135–145)
Total Bilirubin: 0.2 mg/dL — ABNORMAL LOW (ref 0.3–1.2)

## 2010-09-08 LAB — POCT I-STAT, CHEM 8
Chloride: 109 mEq/L (ref 96–112)
Creatinine, Ser: 0.6 mg/dL (ref 0.50–1.10)
Glucose, Bld: 146 mg/dL — ABNORMAL HIGH (ref 70–99)
Hemoglobin: 16.7 g/dL — ABNORMAL HIGH (ref 12.0–15.0)
Potassium: 3.3 mEq/L — ABNORMAL LOW (ref 3.5–5.1)

## 2010-09-08 LAB — URINE MICROSCOPIC-ADD ON

## 2010-09-08 LAB — RAPID URINE DRUG SCREEN, HOSP PERFORMED: Benzodiazepines: NOT DETECTED

## 2010-09-08 LAB — TYPE AND SCREEN
ABO/RH(D): O POS
Antibody Screen: NEGATIVE

## 2010-09-08 LAB — CBC
HCT: 43.6 % (ref 36.0–46.0)
Hemoglobin: 15.9 g/dL — ABNORMAL HIGH (ref 12.0–15.0)
MCH: 30.6 pg (ref 26.0–34.0)
MCHC: 36.5 g/dL — ABNORMAL HIGH (ref 30.0–36.0)

## 2010-09-08 LAB — PROTIME-INR: Prothrombin Time: 12.7 seconds (ref 11.6–15.2)

## 2010-09-08 LAB — LACTIC ACID, PLASMA: Lactic Acid, Venous: 4.3 mmol/L — ABNORMAL HIGH (ref 0.5–2.2)

## 2010-09-08 LAB — ABO/RH: ABO/RH(D): O POS

## 2010-09-08 LAB — ETHANOL: Alcohol, Ethyl (B): <11 mg/dL (ref 0–11)

## 2010-09-08 LAB — APTT: aPTT: 26 seconds (ref 24–37)

## 2010-09-08 MED ORDER — GADOBENATE DIMEGLUMINE 529 MG/ML IV SOLN
20.0000 mL | Freq: Once | INTRAVENOUS | Status: AC | PRN
  Administered 2010-09-08: 20 mL via INTRAVENOUS

## 2010-09-08 MED ORDER — IOHEXOL 350 MG/ML SOLN
100.0000 mL | Freq: Once | INTRAVENOUS | Status: AC | PRN
  Administered 2010-09-08: 100 mL via INTRAVENOUS

## 2010-09-08 NOTE — H&P (Signed)
Medicine Consult Note Date: 09/08/2010  Patient name:  [name]  Medical record number:  [id number] Date of birth:  [birth year]  Age: 50 y.o. Gender:  female PCP:    Lorretta Harp, MD, MD  Medical Service:   Internal Medicine Teaching Service   Attending physician:  Dr. Aundria Rud First Contact:   Dr. Leatrice Jewels Pager:  [phone]  Pager:Second Contact:       Dr. Denton Meek Pager: [phone].      After Hours:    First Contact   Pager: [phone]      Second Contact  Pager: [phone]   Chief Complaint: Headache and stomach pain "since early this morning."  History of Present Illness: Patient is a 50 y.o. female who presents to [facility] for evaluation of an acute onset of headache, nausea, vomiting and abdominal pain of 6 hours duration.  Patient developed symptoms while taking a walk with her family member when developed an acute right upper quadrant abdominal pain, nausea, vomitting, diaphoresis and flushed skin with a gradual onset of a HA> She denies any blurry vision, weakness, speech deficit; CP, SOB, wheezing or pleuritic pain. She denies diarrhea and reports  A chronic constipation with a use of OTC laxatives. She had last BM the day prior to admission on 09/07/10 which was normal in caliber and color and consistency. She denies any fever or chills or dizziness; no recent sick c ontacts or travel or eating out. Patient denies any similar symptoms in the past.  Current Outpatient Medications: Fish Oil one tablet PO daily Pravastatin 20 mg PO qhs     Allergies: Intolerance to ASA _" gives her upset stomach and cuases palpitation." Past Medical History: Past Medical History  Diagnosis Date  . Sicke cell trait COPD with no PFT's on file GERD HLD Hx of chronic hypokalemia Hx of hysterectomy for uterine leiomyomas History of UTI's in childhood      Past Surgical History: Past Surgical History  Procedure Date  . Abdominal hysterectomy     Family History: Family History  Problem Relation Age  of Onset  . Hyperlipidemia Mother   . Hypertension Mother   . Diabetes Mother     Social History: History   Social History  . Marital Status: Single    Spouse Name: N/A    Number of Children: N/A  . Years of Education: N/A   Occupational History  . Not on file.   Social History Main Topics  . Smoking status: Current Everyday Smoker -- 0.5 packs/dayx 28 years  . Smokeless tobacco: Never Used  . Alcohol Use: denies  . Drug Use: denies  . Sexually Active: Not on file    Social History Narrative   Financial assistance approved for 100% discount at [facility] and has [facility] card per [place]1/19/2012    Review of Systems: Pertinent items are noted in HPI.  Vital Signs: T: 96.1 rectal P: 72 BP: 209/100 164/84 RR: 18 O2 sat: 97% RA   Physical Exam: General: Vital signs reviewed and noted. Well-developed, well-nourished, in no acute distress; alert, appropriate and cooperative throughout examination.  Head: Normocephalic, atraumatic.  Eyes: PERRL, EOMI, No signs of anemia or jaundince.  Nose: Mucous membranes moist, not inflammed, nonerythematous.  Throat: Oropharynx nonerythematous, no exudate appreciated.   Neck: No deformities, masses, or tenderness noted.Supple, No carotid Bruits, no JVD.  Lungs:  Normal respiratory effort. Clear to auscultation BL without crackles or wheezes.  Heart: RRR. S1 and S2 normal without gallop, murmur, or rubs.  Abdomen:  BS normoactive. Soft, Nondistended,slight RUQ TTP; Muprhy sign engative.  No masses or organomegaly.  Extremities: No pretibial edema.  Neurologic: A&O X3, CN II - XII are grossly intact. Motor strength is 5/5 in the all 4 extremities, Sensations intact to light touch, Cerebellar signs negative.  Skin: No visible rashes, scars.   Lab results: CBC:    Component Value Date/Time   WBC 20.6* 09/08/2010 1025   HGB 16.7* 09/08/2010 1048   HCT 49.0* 09/08/2010 1048   PLT 400 09/08/2010 1025   MCV 84.0 09/08/2010 1025   NEUTROABS  15.1* 09/08/2010 1025   LYMPHSABS 4.7* 09/08/2010 1025   MONOABS 0.8 09/08/2010 1025   EOSABS 0.0 09/08/2010 1025   BASOSABS 0.0 09/08/2010 1025      Comprehensive Metabolic Panel:    Component Value Date/Time   NA 142 09/08/2010 1048   K 3.3* 09/08/2010 1048   CL 109 09/08/2010 1048   CO2 21 09/08/2010 1025   BUN 11 09/08/2010 1048   CREATININE 0.60 09/08/2010 1048   CREATININE 0.73 08/21/2010 0954   GLUCOSE 146* 09/08/2010 1048   CALCIUM 9.8 09/08/2010 1025   AST 22 09/08/2010 1025   ALT 25 09/08/2010 1025   ALKPHOS 102 09/08/2010 1025   BILITOT 0.2* 09/08/2010 1025   PROT 8.2 09/08/2010 1025   ALBUMIN 3.9 09/08/2010 1025     Lab Results  Component Value Date   CKTOTAL 53 09/08/2010   TROPONINI <0.30 09/08/2010   Lactic acid: 4.3 Lipase 20    Imaging results:   MR brain with and w/o CM:  IMPRESSION:   Small amount of increased signal within the sulci at the vertex on   the right, consistent with the presence of a small amount of   subarachnoid hemorrhage in this location shown by CT.  There is no   other sign of intracranial hemorrhage by MRI.  The underlying brain   appears normal.    Few old small vessel insults within the cerebellum.  No acute   Infarct  CT of abdomen and pelvis with CM: IMPRESSION:   Negative for abdominal aortic dissection or aneurysm.    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  Concern   for stenosis at the origin of the left common iliac artery.    Question a trace amount of pelvic fluid.  CTA of chest: IMPRESSION:   Atherosclerosis of the aorta but no aneurysm or dissection.  Focal   calcification noted at the origin of the left main coronary artery.   This was not evaluated in detail.    Pronounced pulmonary emphysematous changes without acute pulmonary   pathology evident.   Assessment & Plan:  1. Acute Headache with nausea and vomiting with CT and MR  of brain positive for small subarachnoid bleed over right parietal lobe. Unclear etiology given no history of head trauma or falls. Patient does states having severe episode of nausea and emesis with wretching  That could have precipitated the event. Patient is also hypertensive in ED but has no known diagnosis of HTN and/or treatment. Hypertensive crisis could certainly, be a factor precipitating a subarachnoid bleed. Patient is stable and being managed by a Neurology team. 2. Acute abdominal pain in a setting of nausea, emesis, elevated WBC and lactic acidosis. Differential diagnoses include ischemic colitis which was ruled out with a negative CT scan of abdomen and pelvis (specificity of 95%). Atypical presentation of ACS was also considered. However,  CE x1 negative; EKG without any ST wave elevations or T wave inversions. CTA neg for aortic dissection and PE. Patient's LFT's and lipase are unremarkable ->therefore gallstone disease, pancreatitis are unlikely. Patient is currently pain free after one dose of Dilaudid given in ED. 3. Leukocytosis. Likely steri-induced de margination given recent history of the left hip steroid injection by Dr. Lloyd Huger at [facility]. However, given acute abdominal pain and lactic acidosis; infectious causes must be excluded despite lack of fever. UA done in ED is negative. Therefore UTI is unlikely. Will order Blood cultures x 2; and repeat WBC to evaluate for a trend. 4. Hypokalemia, likely due to emesis-induced volume contraction. It was repleted in ED. 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 Will discuss with an attending Dr Aundria Rud whether the patient need vascular work up/consult. 6. UDS positive for THCA. Counseling on PSA initiated. 7. Hx of COPD in a setting of ongoing tobacco abuse. Patient is stable. Counseled on smoking cessation. 8. DVT PPX -  SCD's.      (PGY1):          ____________________________________    Date/ Time:     ____________________________________     Deatra Robinson M.D. (PGY2):  ____________________________________    Date/ Time:     ____________________________________     I have seen and examined the patient. I reviewed the resident/fellow note and agree with the findings and plan of care as documented. My additions and revisions are included.   Signature:  ____________________________________________     Internal Medicine Teaching Service Attending    Date:    ____________________________________________

## 2010-09-09 ENCOUNTER — Other Ambulatory Visit (HOSPITAL_COMMUNITY): Payer: Self-pay

## 2010-09-09 ENCOUNTER — Inpatient Hospital Stay (HOSPITAL_COMMUNITY): Payer: Self-pay

## 2010-09-09 ENCOUNTER — Ambulatory Visit: Payer: Self-pay | Admitting: Internal Medicine

## 2010-09-09 LAB — LACTIC ACID, PLASMA: Lactic Acid, Venous: 1.7 mmol/L (ref 0.5–2.2)

## 2010-09-09 LAB — BASIC METABOLIC PANEL
GFR calc Af Amer: >60 mL/min (ref >60)
GFR calc non Af Amer: >60 mL/min (ref >60)
Potassium: 4.3 mEq/L (ref 3.5–5.1)
Sodium: 138 mEq/L (ref 135–145)

## 2010-09-09 LAB — DIFFERENTIAL
Basophils Absolute: 0 10*3/uL (ref 0.0–0.1)
Basophils Relative: 0 % (ref 0–1)
Eosinophils Relative: 0 % (ref 0–5)
Monocytes Absolute: 0.9 10*3/uL (ref 0.1–1.0)
Neutro Abs: 14.7 10*3/uL — ABNORMAL HIGH (ref 1.7–7.7)

## 2010-09-09 LAB — CBC
MCHC: 35.9 g/dL (ref 30.0–36.0)
RDW: 14.1 % (ref 11.5–15.5)
WBC: 19.2 10*3/uL — ABNORMAL HIGH (ref 4.0–10.5)

## 2010-09-09 LAB — HIV ANTIBODY (ROUTINE TESTING W REFLEX): HIV: NONREACTIVE

## 2010-09-09 LAB — CARDIAC PANEL(CRET KIN+CKTOT+MB+TROPI)
Relative Index: INVALID (ref 0.0–2.5)
Total CK: 46 U/L (ref 7–177)
Troponin I: <0.3 ng/mL (ref <0.30)

## 2010-09-09 LAB — SEDIMENTATION RATE: Sed Rate: 3 mm/hr (ref 0–22)

## 2010-09-09 NOTE — H&P (Signed)
NAMEHANIYA, FERN NO.:  [id number]  MEDICAL RECORD NO.:  [id number]  LOCATION:  3105                         FACILITY:  [facility]  PHYSICIAN:  Levie Heritage, MD       DATE OF BIRTH:  October 24, 1960  DATE OF ADMISSION:  09/08/2010 DATE OF DISCHARGE:                             HISTORY & PHYSICAL   REASON FOR CONSULTATION:  Headache.  CHIEF COMPLAINT:  Severe headache.  HISTORY:  A 50 year old African American woman who woke up this morning and went out for walking when she felt having severe headache and nauseous as well.  She was brought to the emergency department with two complaints, one was headache and the other was severe abdominal pain. She denies any neurological deficit at all.  She has vomited once in the emergency, and the morphine took care of her headache, and now she is at ease for her headache.  The ER Team is still working on evaluation for the abdominal pain and increased WBC counts.  PAST MEDICAL HISTORY:  She is known to have hypercholesterolemia, but no other health issues.  ALLERGIES:  She is allergic to FLAGYL.  PAST SURGICAL HISTORY:  Hysterectomy.  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  Denies alcohol or any other illicit drug abuse.  FAMILY HISTORY:  Mother with diabetes, coronary artery disease, and hypertension and DVTs.  No other health issues in the family.  REVIEW OF CLINICAL DATA:  I have reviewed her CT head performed today that does show very tiny right parietal subarachnoid blood.  Her CT angio of the abdomen has been read as thrombus for the inferior abdominal aorta and concern for stenosis at the region of the left common iliac artery.  LABORATORY DATA:  CBC is 20,000.  Urinalysis unremarkable.  CK-MB is normal.  CMP, there is mild increase in glucose and low in potassium. Hemoglobin is high at 16.7.  REVIEW OF SYSTEMS:  Denies any chest pain,  denies any shortness of breath, denies any sputum production, denies any recent fevers, denies any recent rashes.  No issues with the burning urination.  She does complain of having abdominal pain, but denies any other factor suspicion of ileus.  Denies any visual changes, no problem with the motor deficit or sensory issues.  Rest of 10-organ review of system unremarkable.  PHYSICAL EXAMINATION:  GENERAL:  Comfortably lying down in bed. VITAL SIGNS:  Range between systolic 172 to 201 mmHg and diastolic is 92 mmHg, pulse 74 per minute regular. NEUROLOGIC:  Awake, oriented x3 in no acute distress.  Reactive pupils to light, moves eyes to all direction.  No field cut noted.  Symmetrical face sensation and strength testing, midline tongue without atrophy or fasciculation.  Palate elevates symmetrically bilaterally. MUSCULOSKELETAL:  Motor examination reveals strength 5/5 all over.  The sensory examination reveals light touch sensation intact all over.  IMPRESSION:  A 50 year old woman with severe headache and nauseous feeling with a dot-like hyperdensity on the CT head is concerning for hypertension-related tiny intracranial hemorrhage.  Her other issue is abdominal pain with the setting of increased WBCs and CT angio findings  of the abdominal pelvis, suggesting possible proximal left common iliac artery stenosis and possibly abdominal aortic ulcer.  PLAN: 1. I have discussed the patient detail with the ER team and I have     advised them to call for the appropriate medicine or surgical     consult for patient's increased WBC counts and CT angio findings. 2. For her intracranial hemorrhage, currently the main issue is that     to control the blood pressure and keep it between 140-160 mm     systolic ranges. 3. There is no heparin or antiplatelets products for this patient. 4. We will use the SCDs for the DVT prophylaxis. 5. Preferred use for hypertension will be nicardipine. 6. Please  get the MRI and MRA of the brain in order to evaluate for     any other possible etiologies of her hemorrhage, however, so far it     looks hyperdense region.    ______________________________ Levie Heritage, MD     WS/MEDQ  D:  09/08/2010  T:  09/08/2010  Job:  [id number]  Electronically Signed by Levie Heritage MD on 09/09/2010 11:10:48 AM

## 2010-09-10 ENCOUNTER — Encounter: Payer: Self-pay | Admitting: Internal Medicine

## 2010-09-10 ENCOUNTER — Inpatient Hospital Stay (HOSPITAL_COMMUNITY): Payer: Self-pay

## 2010-09-10 DIAGNOSIS — N19 Unspecified kidney failure: Secondary | ICD-10-CM

## 2010-09-10 LAB — DIFFERENTIAL
Basophils Absolute: 0 10*3/uL (ref 0.0–0.1)
Basophils Relative: 0 % (ref 0–1)
Eosinophils Absolute: 0 10*3/uL (ref 0.0–0.7)
Lymphocytes Relative: 20 % (ref 12–46)
Lymphs Abs: 3.8 10*3/uL (ref 0.7–4.0)
Monocytes Absolute: 1.1 10*3/uL — ABNORMAL HIGH (ref 0.1–1.0)
Monocytes Relative: 6 % (ref 3–12)
Neutro Abs: 11.5 10*3/uL — ABNORMAL HIGH (ref 1.7–7.7)
Neutrophils Relative %: 71 % (ref 43–77)
Neutrophils Relative %: 73 % (ref 43–77)

## 2010-09-10 LAB — HEMOGLOBIN A1C
Hgb A1c MFr Bld: 6 % — ABNORMAL HIGH (ref <5.7)
Mean Plasma Glucose: 126 mg/dL — ABNORMAL HIGH (ref <117)

## 2010-09-10 LAB — CBC
Hemoglobin: 15.8 g/dL — ABNORMAL HIGH (ref 12.0–15.0)
Hemoglobin: 16 g/dL — ABNORMAL HIGH (ref 12.0–15.0)
MCH: 30.3 pg (ref 26.0–34.0)
Platelets: 424 10*3/uL — ABNORMAL HIGH (ref 150–400)
RBC: 5.22 MIL/uL — ABNORMAL HIGH (ref 3.87–5.11)
RBC: 5.28 MIL/uL — ABNORMAL HIGH (ref 3.87–5.11)

## 2010-09-10 LAB — C4 COMPLEMENT: Complement C4, Body Fluid: 34 mg/dL (ref 10–40)

## 2010-09-10 LAB — C3 COMPLEMENT: C3 Complement: 170 mg/dL (ref 90–180)

## 2010-09-10 LAB — LIPID PANEL
Cholesterol: 293 mg/dL — ABNORMAL HIGH (ref 0–200)
VLDL: 33 mg/dL (ref 0–40)

## 2010-09-10 MED ORDER — IOHEXOL 300 MG/ML  SOLN
150.0000 mL | Freq: Once | INTRAMUSCULAR | Status: AC | PRN
  Administered 2010-09-10: 72 mL via INTRAVENOUS

## 2010-09-10 NOTE — Progress Notes (Signed)
[name] was admitted on 09/08/2010 with acute onset of RUQ abdominal pain, nausea, vomiting, and headache. She was found to have a subarachnoid hemorrhage of right frontoparietal lobe on CT and had SBP >200. Patient was started on nicardipine drip and BPs improved. The next day, the patient had another episode of headache and SBP 215. Patient was given labetalol 20 mg IV x 1 and nicardipine drip restarted. After discontinuing nicardipine 4 hours later, BPs remained 130-150 / 50-80. Teaching service was consulted for BP management. Pt was started on HCTZ 25 mg PO daily. Pt also has hyperlipidemia. Pt previously on pravastatin 20 mg po daily. Patient discharged on pravastatin 40 mg po daily. Pt has documented allergy to simvastatin (rash). If deemed appropriate, can get simvastatin from [facility] for $6. May benefit from Lipitor, would need to apply for Medication Assistance Program. 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

## 2010-09-11 LAB — COMPLEMENT, TOTAL: Compl, Total (CH50): >60 U/mL — ABNORMAL HIGH (ref 31–60)

## 2010-09-13 NOTE — Consult Note (Signed)
  [name], [name] NO.:  [id number]  MEDICAL RECORD NO.:  [id number]  LOCATION:  3105                         FACILITY:  [facility]  PHYSICIAN:  Donalee Citrin, M.D.        DATE OF BIRTH:  [birth year]  DATE OF CONSULTATION: DATE OF DISCHARGE:                                CONSULTATION   PREOPERATIVE DIAGNOSIS:  Headache, hypertension and sulcal subarachnoid hemorrhage right parietal.  HISTORY OF PRESENT ILLNESS:  The patient is a 50-year female who presents to the emergency department with acute onset abdominal pain and headache and noted to be very hypertensive.  The patient states immediately prior to this onset of headache, abdominal pain she was walking and a little bit of running, the family and the patient reported lot of a stress going on in her life taking care of her husband and her father also.  Currently, the patient is having significant amount of abdominal pain, she still has a little bit of headache.  Her blood pressures have been better controlled.  PAST MEDICAL HISTORY:  Fairly remarkable for COPD, hypercholesterolemia, hypokalemia, and diabetes.  PAST SURGICAL HISTORY:  Hysterectomy.  SOCIAL HISTORY:  She smokes about a pack a day cigarettes.  ALLERGIES:  She has allergies to FLAGYL and EGGS.  PHYSICAL EXAMINATION:  GENERAL:  She is a very pleasant awake and alert 50 year old female in no acute stress. HEENT:  Within normal limits. NEUROLOGIC:  Pupils are equal, reactive.  Extraocular movements intact. Cranial nerves otherwise intact.  Strength is 5/5 in her upper and lower extremities.  No evidence of pronator drift.  Her CT scan shows very small amount of right parietal subarachnoid hemorrhage.  I think this is far more consistent with a hypertensive spike.  I would recommend strict blood pressure management.  She is being seen and evaluated by neurologist who will help with this as well as continued workup as needed and as they feel  indicated.  I recommend the emergency department to continue working up for abdominal pain and I see no neurosurgical intervention needed here will be available as needed on consult.          ______________________________ Donalee Citrin, M.D.    GC/MEDQ  D:  09/08/2010  T:  09/08/2010  Job:  [id number]  Electronically Signed by Donalee Citrin M.D. on 09/13/2010 07:25:58 AM

## 2010-09-14 LAB — CULTURE, BLOOD (ROUTINE X 2): Culture  Setup Time: 201207152358

## 2010-09-20 ENCOUNTER — Ambulatory Visit: Payer: Self-pay | Admitting: Licensed Clinical Social Worker

## 2010-09-20 ENCOUNTER — Ambulatory Visit (INDEPENDENT_AMBULATORY_CARE_PROVIDER_SITE_OTHER): Payer: Self-pay | Admitting: Internal Medicine

## 2010-09-20 ENCOUNTER — Encounter: Payer: Self-pay | Admitting: Internal Medicine

## 2010-09-20 ENCOUNTER — Other Ambulatory Visit: Payer: Self-pay | Admitting: Licensed Clinical Social Worker

## 2010-09-20 VITALS — BP 120/76 | HR 77 | Temp 97.3°F | Ht 65.0 in | Wt 172.5 lb

## 2010-09-20 DIAGNOSIS — I6509 Occlusion and stenosis of unspecified vertebral artery: Secondary | ICD-10-CM | POA: Insufficient documentation

## 2010-09-20 DIAGNOSIS — Z720 Tobacco use: Secondary | ICD-10-CM

## 2010-09-20 DIAGNOSIS — I609 Nontraumatic subarachnoid hemorrhage, unspecified: Secondary | ICD-10-CM

## 2010-09-20 DIAGNOSIS — E785 Hyperlipidemia, unspecified: Secondary | ICD-10-CM

## 2010-09-20 DIAGNOSIS — I10 Essential (primary) hypertension: Secondary | ICD-10-CM

## 2010-09-20 NOTE — Progress Notes (Signed)
I saw patient and discussed her care with resident Dr. Niu.  I agree with the clinical findings and plans as outlined in his note. 

## 2010-09-20 NOTE — Assessment & Plan Note (Addendum)
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 She is free of symptoms. She would be benefited from antiplatelet agent if she did not have SAH. Currently, patient is on Pravachol for controlling her hyperlipidemia. She was encouraged to quit smoking and lives with a healthier life style. CMP was ordered today.

## 2010-09-20 NOTE — Patient Instructions (Signed)
1. You are doing great. Continue to take all your medications as prescribed. Continue exercising. Pls let us know if you have any questions or concerns. 2. I would like to see you again in one month. But, please come to hospital if you develop similar symptoms as last time.

## 2010-09-20 NOTE — Assessment & Plan Note (Signed)
Her HTN is well controlled. Today her Bp is 120/76 mmHg. Will continue her current regimen and follow-up. CMP was ordered.

## 2010-09-20 NOTE — Progress Notes (Signed)
  Subjective:    Patient ID: [name], female    DOB: May 10, 1960, 50 y.o.   MRN: [id number]  HPI  Patient is 50yo female with PMH of hyperlipidemia and tobacco abuse presents for a follow-up visit. Patient was recently hospitalized because of sudden onset severe headache and abdominal pain. 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 During her stay in hospital, she was treated mainly by controlling her blood pressure with Nicardipine and IV Labetalol. Today patient does not have any symptoms and feels normal.  Review of Systems  ROS: General: no fevers, chills, changes in weight, changes in appetite Skin: no rash HEENT: no blurry vision, hearing changes, sore throat Pulm: no dyspnea, coughing, wheezing CV: no chest pain, palpitations, shortness of breath Abd: no abdominal pain, nausea/vomiting, diarrhea/constipation GU: no dysuria, hematuria, polyuria Ext: no arthralgias, myalgias Neuro: no weakness, numbness, or tingling     Objective:   Physical Exam  General: alert, well-developed, and cooperative to examination.  Head: normocephalic and atraumatic.  Eyes: vision grossly intact, pupils equal, pupils round, pupils reactive to light, no injection and anicteric.  Mouth: pharynx pink and moist, no erythema, and no exudates.  Neck: supple, full ROM, no thyromegaly, no JVD, and no carotid bruits.  Lungs: normal respiratory effort, no accessory muscle use, normal breath sounds, no crackles, and no wheezes. Heart: normal rate, regular rhythm, no murmur, no gallop, and no rub.  Abdomen: soft, non-tender, normal bowel sounds, no distention, no  guarding, no rebound tenderness, no hepatomegaly, and no splenomegaly.  Msk: no joint swelling, no joint warmth, and no redness over joints.  Pulses: 2+ DP/PT pulses bilaterally Extremities: No cyanosis, clubbing, edema Neurologic: alert & oriented X3, cranial nerves II-XII intact, strength normal in all extremities, sensation intact to light touch, and gait normal.  Skin: turgor normal and no rashes.        Assessment & Plan:   No problem-specific assessment & plan notes found for this encounter.

## 2010-09-20 NOTE — Assessment & Plan Note (Signed)
Patient is on Pravachol. CMP was ordered to check her liver function. Will follow up.

## 2010-09-20 NOTE — Assessment & Plan Note (Addendum)
Patient is free of symptoms. Blood pressure is well controlled by HCTD.  Her Bp is 120/76 mmHg today. CMP is ordered. Patient is still smoking half patch a day of cigarette. I counseled her about the risk of smoking and the benefit of quitting smoking. She agreed to have a talk with Dorothe Pea for making a plan of quitting her smoking. Patient is encouraged to continue her exercise and stay away from fatty food. will follow up in one month.

## 2010-09-20 NOTE — Discharge Summary (Signed)
[name] NO.:  [id number]  MEDICAL RECORD NO.:  [id number]  LOCATION:  5525                         FACILITY:  [facility]  PHYSICIAN:  Pramod P. Pearlean Brownie, MD    DATE OF BIRTH:  [birth year]  DATE OF ADMISSION:  09/08/2010 DATE OF DISCHARGE:  09/12/2010                              DISCHARGE SUMMARY   PRIMARY CARE PHYSICIAN:  Listed as [place].  DIAGNOSES AT THE TIME OF DISCHARGE: 1. Small right frontal intraparenchymal hemorrhage, unknown etiology. 2. Hypercholesterolemia. 3. Hypertension. 4. Cigarette smoker. 5. Sickle cell trait. 6. Gastroesophageal reflux disease. 7. Chronic obstructive pulmonary disease with no PFTs on file. 8. History of chronic hypokalemia. 9. History of hysterectomy for uterine fibroids. 10.History of urinary tract infections in childhood. 11.Abdominal hysterectomy. 12.Leukocytosis, likely steroid-induced demargination given recent     history of right hip steroid injection. 13.Urine drug screen positive for THC. 14.Headache with nausea and vomiting, etiology unclear. 15.Abdominal pain, is resolved etiology unclear.  MEDICINES AT TIME OF DISCHARGE: 1. Hydrochlorothiazide 25 mg a day. 2. Fish oil 1000 mg a day. 3. Pravastatin 40 mg bedtime. 4. Tylenol 2 tablets q.4 hours p.r.n.  STUDIES PERFORMED: 1. CT of the brain on admission shows subarachnoid hemorrhage     overlying the right parietal lobe. 2. CT angio of the chest, atherosclerosis of the aorta, but no     aneurysm or dissection, focal calcification noted at origin of the     left main carotid artery. 3. CT angio of the abdomen is negative for AAA or aneurysms.     Atherosclerotic disease involving the infrarenal abdominal aorta     with neural thrombus and probably small penetrating ulcers.     Concern for stenosis at the origin of the left common iliac artery.     Question of trace amount of pelvic fluid.  Chronic colonic     diverticulosis.  No evidence for acute  colonic inflammation. 4. MRI of the brain shows small amount of increased signal within the     sulci at the vertex on the right, consistent with the presence of     small amount of subarachnoid hemorrhage in this location shown by     CT.  There is no other sign of intracranial hemorrhage by MRI.  No     other sign of intracranial hemorrhage by MRI.  The underlying brain     appears normal.  Two small old vessel insults within the     cerebellum.  No acute infarct. 5. CT of the brain at 24 hours shows unchanged appearance of known     small focus of subarachnoid hemorrhage within the right frontal     parietal lobe. 6. Cerebral angiogram shows no evidence of stenosis, dissections,     occlusions, or aneurysms noted.  No intracranial evidence of AV     shunting to suggest dural AV fistula or AV malformation.  Venous     outflow within normal limits.  50-60% stenosis just distal to the     origin of the left vertebral artery.  Mild FMD like changes in mid     1/3rd of both internal carotid arteries. 7. Renovascular duplex exam  shows abdominal aorta is ectatic in the     mid to distal area with atherosclerotic changes, but no evidence of     aneurysm.  Bilaterally, the renal veins are patent.  Celiac axis     and superior mesenteric arteries are patent with no evidence of     stenosis.  No evidence of renal artery stenosis bilaterally.     Bilaterally, the kidneys were within normal limits, for length no     evidence of other abnormality. 8. EKG shows sinus bradycardia with no significant change since last     tracing.  LABORATORY STUDIES:  CH50 greater than 60.  CBC with hemoglobin 16.0, hematocrit 43.7, white blood cells 16.1, platelets 424, hemoglobin A1c 6.0.  ANA negative.  Lipid profile with cholesterol 298, triglycerides 163, HDL 58, LDL 202, C4 34, C3 170.  Sed rate 3.  Blood culture negative.  Chemistry with glucose 109, otherwise normal.  HIV nonreactive.  Lactic acid 1.7.   MRSA screening negative.  Aspirin less than 2, alcohol less than 11.  Urine drug screen negative.  Urinalysis was 0-2 red blood cells, lipase 20, lactic acid 4.3, troponin less than 0.30.  Coagulation studies on arrival are normal.  HISTORY OF PRESENT ILLNESS:  [name] is a 50 year old right- handed African American female who woke the morning of admission and went out walking when she developed a severe headache and nausea.  She was brought to the emergency room with two complaints, one was headache and the other was severe abdominal pain.  She denies any focal neurologic deficits.  She has vomited once in the emergency room. Morphine resolved her headache.  The emergency physician worked on abdominal pain and increased white blood cell count.  CT of the brain showed a dot like hyperdensity related to tiny intracranial hemorrhage. The patient was not a tPA candidate secondary due to hemorrhage, was admitted with the Neuro ICU for further evaluation.  HOSPITAL COURSE:  The patient was admitted to the Neuro ICU to evaluate hemorrhage.  Right frontal hemorrhage which was small, remained stable. She had some mild hypertension and was started on hydrochlorothiazide for blood pressure in the 170s/90s.  Since starting the medicine, her blood pressures have dropped to the 130s/80s and have remained stable. The patient was taking ibuprofen at home prior to admission.  Aspirin, antiplatelets, and NSAIDs have been stopped and patient has been educated on this.  It is unclear of the etiology of her hemorrhage. Recommend followup in 2 months with Dr. Pearlean Brownie for an MRI to rule out malignancy or other etiology for hemorrhage.  Related to abdominal pain, no clear etiology was found.  Workup was negative.  Symptoms of pain resolved.  Internal Medicine Teaching Service was consulted for blood pressure control and other medical management.  The patient did have hypertensive crisis while in  the ICU with blood pressure going up to 180 while going to the bathroom.  Decision was made to start hydrochlorothiazide as above.  The patient does have vasculopathy per CTs performed and has aortic disease.  Plans are for outpatient clinic cardiovascular workup to look at multiple risk factors.  The patient also with leukocytosis, felt to be secondary to a recent steroid injection she received.  CONDITION AT THE TIME OF DISCHARGE:  The patient alert and oriented x3. Speech clear.  Normal language.  No aphasia.  Face symmetric.  No drift in upper extremities.  No focal weakness in arms or legs.  Gait steady.  DISCHARGE PLAN: 1. Discharge home with family. 2. Increase pravastatin to 40 mg a day. 3. New hydrochlorothiazide. 4. Followup with Dr. Pearlean Brownie in his office in 2 months.  MRI at that     time to look for source of hemorrhage. 5. Followup with [facility].     Annie Main, N.P.   ______________________________ Sunny Schlein. Pearlean Brownie, MD    SB/MEDQ  D:  09/12/2010  T:  09/13/2010  Job:  [id number]  cc:   [facility] Dr. Clyde Lundborg  Electronically Signed by Annie Main N.P. on 09/16/2010 03:31:53 PM Electronically Signed by Delia Heady MD on 09/20/2010 11:16:02 AM

## 2010-09-21 LAB — COMPLETE METABOLIC PANEL WITH GFR
ALT: 21 U/L (ref 0–35)
AST: 20 U/L (ref 0–37)
CO2: 28 mEq/L (ref 19–32)
Calcium: 10.2 mg/dL (ref 8.4–10.5)
Chloride: 99 mEq/L (ref 96–112)
Creat: 0.82 mg/dL (ref 0.50–1.10)
GFR, Est African American: >60 mL/min (ref >60)
Sodium: 139 mEq/L (ref 135–145)
Total Bilirubin: 0.4 mg/dL (ref 0.3–1.2)
Total Protein: 7.2 g/dL (ref 6.0–8.3)

## 2010-09-25 NOTE — Progress Notes (Signed)
Soc. Work.  20 minutes.  Met with patient to assist her with smoking cessation counseling.  She is a longtime smoker who reported to me that she was likely smoking a little more than a half pack per day.  Her reasons for quitting are health related with a recent hospitalization that identified high blood pressure, subarachnoid hemorrhage, and vertebral artery stenosis.   The patient lives on her own and is currently a Consulting civil engineer at [employer]. She does not have any smokers to contend with in the house and does not have anyone she can quit with.  Smoking cessation handout was given to her and we went over the various tips to use to prepare for quitting.   After explaining the benefits and 2x success rate of medication vs cold [place] the patient was interested in purchasing the patches over the counter and I've explained in detail how to go about doing that, what to purchase and how to use the patches.   She is also amenable to using gum suckers and straws as substitutes for her cigarettes.   The patient is going back to school in August and will not be able to register for the group sessions at [facility].   Since the Quitline is not funded right now I will call her to see how she is doing.  She was planning to use an August 1st quit date.

## 2010-09-26 ENCOUNTER — Telehealth: Payer: Self-pay | Admitting: Licensed Clinical Social Worker

## 2010-09-26 NOTE — Telephone Encounter (Signed)
Telephone support.  [name] said she is down to 1 to 2 cigarettes.  She cannot afford the patches. She is using gum and lifesavers to quit.  Other barriers include mom sister niece and nephews who come to the house and smoke.  She is working on signs to make her apt smokefree.  I've encouraged her to quit smoking prior to her 8/28 appmt with Dr. Clyde Lundborg.    Plan B would be to explore getting her Chantix thru the health department.

## 2010-09-27 ENCOUNTER — Ambulatory Visit: Payer: Self-pay | Admitting: Family Medicine

## 2010-09-30 NOTE — Consult Note (Signed)
NAMEVERNIA, TEEM              ACCOUNT NO.:  [id number]  MEDICAL RECORD NO.:  [id number]  LOCATION:  3105                         FACILITY:  [facility]  PHYSICIAN:  C. Ulyess Mort, M.D.DATE OF BIRTH:  26-May-1960  DATE OF CONSULTATION: DATE OF DISCHARGE:                                CONSULTATION   REQUESTING PHYSICIAN:  Levie Heritage, MD  REASON FOR CONSULTATION:  The patient is admitted with subarachnoid hemorrhage, acute abdominal pain in the setting of leukocytosis and lactic acidosis.  HISTORY OF PRESENT ILLNESS:  [name] is a very pleasant 50 year old African American woman who was admitted through the emergency department with a chief complaint of gradula onset of a headache with nausea and vomiting  that started early morning on a day of admission.Patient denies any precipitating events. The patient states that she developed subsequent acute abdominal pain that was generalized and without any diarrhea.  The patient states that she is constipated, but this is a chronic problem.  The patient took over-the- counter laxative yesterday with a normal bowel movement without any change in color, any mucus, or bright red blood per rectum.  The patient states that she was feeling diaphoretic and flushed.  She denies any recent sick contacts.  Denies any illicit drug use.  Denies any weakness, any chest pain, shortness of breath, or dizziness.  The patient was evaluated in the emergency department by the Stroke Team. The CT scan of the head demonstrates a small subarachnoid stroke overlying the right parietal lobe, which was confirmed with an MR of the brain with and without contrast media. However, the patient has a white blood cell count of 20,000 with the lactic acid level of 4.3.  At the time of examination, the patient continued to complain of generalized mild right upper quadrant abdominal pain with some nausea and mild headache.Patient was started on a Nicardipine  drip and Dilaudid PRN for pain.  ALLERGIES:  The patient is allergic to METRONIDAZOLE and EGGS, reaction is unknown.  HOME MEDICATIONS: 1. Pravastatin 20 mg 1 p.o. nightly. 2. Fish oil over-the-counter 1 tablet daily.  PAST MEDICAL HISTORY:  Significant for: 1. COPD in the setting of ongoing smoking with a 28 pack year history. 2. Hypercholesterolemia. 3. History of chronic hypokalemia. 4. History of GERD. 5. Chronic constipation. No previous endoscopy studies. 6. History of low back pain. 7. History of multiple UTIs in childhood. 8. History of positive sickle cell trait. 9. History of hysterectomy in 1998 for uterine fibroids.  SOCIAL HISTORY:  The patient is single.  She has a grown daughter.  She smokes a pack per day for 28 years.  She denies any alcohol or substance abuse.  The patient is currently unemployed for 2 years, which apparently adds to her stress.  The patient has an associate degree in criminal justice and she used to work as a Scientist, physiological at [employer].  FAMILY MEDICAL HISTORY:  Mother with diabetes, hypertension, and history of DVTs.  Also, she states that the patient's mother had an MI at age of 57 years old.  The patient's father died in a motor vehicle accident at age of 51 years old.  She has 2 healthy sisters.  REVIEW OF SYSTEMS:  As per HPI.  PHYSICAL EXAMINATION:  VITAL SIGNS:  At the time of admission with blood pressure of 209/100 and 164/84 while examining the patient, pulse rate of 72, respiratory rate of 18, temperature of 96.1 rectally, and saturating 97% on room air. GENERAL APPEARANCE:  The patient is alert and oriented x3.  She does have a tired appearance with some slurred speech, most likely secondary to administration of Dilaudid in the emergency department. HEENT:  Head is atraumatic.  EOMs are intact bilaterally.  PERRL bilaterally.  No icterus or scleral injection bilaterally. NOSE:  No septal deviation. OROPHARYNX:  Somewhat dry  buccal mucosa with mild erythema in the posterior pharynx.  No asphyxiation.  No exudates.  Uvula is midline. NECK:  Supple.  No lymphadenopathy.  No JVD.  No masses appreciated. LUNGS:  Thorax is equal on expansion bilaterally.  Clear to auscultation bilaterally. CARDIOVASCULAR:  Regular rate and rhythm.  No murmurs or rubs.  Pulses are 2+/4 in upper and lower extremities bilaterally. ABDOMEN:  Somewhat obese.  Bowel sounds positive.  Abdomen is soft.  It is mildly tender to palpation in the right upper quadrant, but no Murphy sign.  No guarding.  No rebound.  No hepatosplenomegaly. EXTREMITIES:  No cyanosis, clubbing, or edema bilaterally.  No calf tenderness bilaterally. GU:  No CVA tenderness bilaterally. SKIN:  No rashes. NEUROLOGIC:  Nonfocal.  LABORATORY DATA:  At the time of admission, white blood cell count of 20.6 with neutrophil count of 73, lymphocytes 23 monocytes 4, eosinophils 0, basophils 0 with ANC of 15.1, hemoglobin of 15.9, MCV of 84.0, and platelet count of 400.  Smear, review of morphology is unremarkable.  Urinalysis with specific gravity of 1.017, pH of 6.0, trace of hemoglobin, negative for bilirubin or ketones, total protein of 100, negative for nitrites and leuk esterase.  Urine micro showed 0-2 rbc's and mucus.  Total CK is 53, CK-MB 1.9, troponin of less than 0.3.  Sodium of 141, potassium of 3.3, chloride 104, carbon dioxide 21, BUN of 12, creatinine of 0.69 with glucose of 140, calcium of 9.8, total protein of 8.2, BUN 3.9, AST of 22, ALT of 25, alk phos of 102, total bilirubin of 0.02, GFR more than 60.  CT of the head without contrast is positive for focal area of subarachnoid hemorrhage overlying the right parietal lobe.  Lactic acid is 4.3.  CTA of the chest is negative for aneurysms or PE. calcified coronary arteries noted. CT of abdomen and pelvis negative for acute process; right common illiac artery stenosis.  EKG demonstrates sinus  rhythm at 67 beats per minute with normal axis at 60 degrees with multiple atrial premature complexes, borderline short PR intervals at 112 milliseconds.  There is an isolated Q-wave in lead III. There is an early transition of R-wave in lead V2, otherwise EKG is without any ST elevations or T-wave inversions.  ASSESSMENT/PLAN: 1. This is a 50 year old African American woman admitted with headache     and nausea with the CT scan and MR of the brain positive for a     small subarachnoid hemorrhage over the right parietal lobe.      Cause is most likely due to a hypertensive crisis. The     patient is currently stable.  Management per Primary Neurology     Team. 2. Acute abdominal pain in the setting of lactic acidosis and     leukocytosis.  Initially, Ischemic colitis was on the top of our  differential     diagnosis, however, the CT scan of abdomen and pelvis returned     negative, which does have a 95% sensitivity.  Atypical presentation     for an ACS was also considered.  However, the first set of cardiac     enzymes was negative.  We will continue to cycle cardiac enzymes 2     more times 6 hours apart.  We will repeat EKG in the morning.     At this time, the cause of acute abdominal pain is still unclear.     However, the patient reports its resolution. 3. Leukocytosis with white blood cell count of 20,000, most likely iatrogenic in nature, i.e.     steroid-induced demargination given the patient's recent history of     left hip steroid injection by Dr. Lloyd Huger at [facility]  However, also infectious etiologies need to be     excluded, therefore blood cultures were obtained x2 and we will     repeat a CBC with a differential in the morning. 4. Urine drug screen positive for tetrahydrocannabinol.  The patient     counselled on illicit drug use cessation. 5. History of chronic obstructive pulmonary disease with an ongoing     smoking history.  Again, the  patient recieved counseling. 6. Vascluopathy per CT scan findings. Will order fasting lipid panel; will need to continue statin regimen.    Once the patient is discharged, the patient will need a further cardio-vascular work up.  Thank you for this interesting consult.  Please feel free to contact for any additional information.     Brittany Robinson, MD   ______________________________ C. Ulyess Mort, M.D.    NK/MEDQ  D:  09/08/2010  T:  09/09/2010  Job:  [id number]  Electronically Signed by [name]  on 09/10/2010 12:20:33 PM Electronically Signed by Eliezer Lofts M.D. on 09/30/2010 11:19:55 AM

## 2010-10-15 ENCOUNTER — Encounter: Payer: Self-pay | Admitting: Internal Medicine

## 2010-10-15 ENCOUNTER — Ambulatory Visit (INDEPENDENT_AMBULATORY_CARE_PROVIDER_SITE_OTHER): Payer: Self-pay | Admitting: Internal Medicine

## 2010-10-15 DIAGNOSIS — F17200 Nicotine dependence, unspecified, uncomplicated: Secondary | ICD-10-CM

## 2010-10-15 DIAGNOSIS — I609 Nontraumatic subarachnoid hemorrhage, unspecified: Secondary | ICD-10-CM

## 2010-10-15 DIAGNOSIS — E785 Hyperlipidemia, unspecified: Secondary | ICD-10-CM

## 2010-10-15 DIAGNOSIS — J309 Allergic rhinitis, unspecified: Secondary | ICD-10-CM

## 2010-10-15 DIAGNOSIS — I10 Essential (primary) hypertension: Secondary | ICD-10-CM

## 2010-10-15 MED ORDER — PRAVASTATIN SODIUM 20 MG PO TABS: 80.0000 mg | ORAL_TABLET | Freq: Every day | ORAL | Status: DC

## 2010-10-15 MED ORDER — LORATADINE 10 MG PO TABS: 10.0000 mg | ORAL_TABLET | Freq: Every day | ORAL | Status: DC | PRN

## 2010-10-15 MED ORDER — ATORVASTATIN CALCIUM 10 MG PO TABS: 10.0000 mg | ORAL_TABLET | Freq: Every day | ORAL | Status: DC

## 2010-10-15 NOTE — Assessment & Plan Note (Signed)
Patient is free of symptoms. Blood pressure is well controlled by HCTD. Her Bp is 100/76 mmHg today. No new issue. Will follow up.

## 2010-10-15 NOTE — Assessment & Plan Note (Addendum)
Patient has been on 40 mg of Pravachol daily. But her lipid panel results showed that her CH, TG, HDL and LDL were 293, 163, 58 and 161, respectively. I talked to her about the importance of controlling her CH level. I explained that I would like to put her on a stronger medication such as atorvastatin. But she has financial difficulty to afford for this new medication. Finally, I gave her two prescriptions, one is 80 mg of pravachol and another is atorvastatin. She would like to think for a while before deciding which one she can buy. I told her that if the atorvastatin is too expensive, she should at least take a double dose of pravachol (80 mg daily) from now.

## 2010-10-15 NOTE — Assessment & Plan Note (Signed)
Patient symptoms are most likely caused by allergic sinusitis. Her symptoms are mild, no fever, no purulent nasal discharge and chest pain. Claritin is prescribed and will follow up.

## 2010-10-15 NOTE — Assessment & Plan Note (Signed)
She was encouraged to quit smoking and lives with a healthier life style. Dr. Aundria Rud spent about 20 minuts with her to explain the risk and benefit about quitting smoking. Patient also talked with Freada Bergeron over phone about this issue recently. She seems to have realized the importance of quitting smoking and agreed to do her best to cut down her smoking.  will follow up.

## 2010-10-15 NOTE — Progress Notes (Signed)
  Subjective:    Patient ID: [name], female    DOB: 08/05/1960, 50 y.o.   MRN: [id number]  HPI Patient is 50yo female with PMH of SAH, stenosis of vertebral and iliac arteries, HTN, hyperlipidemia and tobacco abuse, who presents with running nose and headache. 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 Since yesterday, she started having similar symptoms as before, including running nose, sore throat, mild dry cough and mild headache in occipital area. She denies fever, chills, SOB and chest pain. Patient was last since on September 10, 2010. I adjusted her blood pressure medications, and ordered CMP and lipid panel for her. The test results showed that her CMP was normal and that LDL was 202.    Review of Systems General: no fevers, chills, changes in weight, changes in appetite Skin: no rash HEENT: no blurry vision, hearing changes. Running nose, sore throat and headache Pulm: dry cough, no dyspnea, , wheezing CV: no chest pain, palpitations, shortness of breath Abd: no abdominal pain, nausea/vomiting, diarrhea/constipation GU: no dysuria, hematuria, polyuria Ext: no arthralgias, myalgias Neuro: no weakness, numbness, or tingling     Objective:   Physical Exam General: alert, well-developed, and cooperative to examination.  Head: normocephalic and atraumatic. No tenderness over maxillary and frontal sinuses. Eyes: vision grossly intact, pupils equal, pupils round, pupils reactive to light, no injection and anicteric.  Mouth: pharynx pink and moist, no erythema, and no exudates.  Neck: supple, full ROM, no thyromegaly, no JVD, and no carotid bruits.  Lungs: normal respiratory effort, no accessory muscle use, normal breath sounds, no crackles, and no wheezes. Heart: normal rate, regular rhythm, no murmur, no gallop, and no rub.  Abdomen: soft, non-tender, normal bowel sounds, no distention, no  guarding, no rebound tenderness, no hepatomegaly, and no splenomegaly.  Msk: no joint swelling, no joint warmth, and no redness over joints.  Pulses: 2+ DP/PT pulses bilaterally Extremities: No cyanosis, clubbing, edema Neurologic: alert & oriented X3, cranial nerves II-XII intact, strength normal in all extremities, sensation intact to light touch, and gait normal.  Skin: turgor normal and no rashes.         Assessment & Plan:

## 2010-10-15 NOTE — Patient Instructions (Signed)
I give you two prescriptions, one is 80 mg of pravachol and another is atorvastatin. I would like you to get atorvastatin. But if it is too expensive for you, please take 80mg   pravachol from now.

## 2010-10-15 NOTE — Assessment & Plan Note (Signed)
Her HTN is well controlled. Today her Bp is 100/70 mmHg. Her last CMP was normal. Will continue her current regimen and follow-up.

## 2010-10-22 ENCOUNTER — Encounter: Payer: Self-pay | Admitting: Internal Medicine

## 2010-10-31 ENCOUNTER — Encounter: Payer: Self-pay | Admitting: Obstetrics and Gynecology

## 2010-10-31 ENCOUNTER — Ambulatory Visit (INDEPENDENT_AMBULATORY_CARE_PROVIDER_SITE_OTHER): Payer: Self-pay | Admitting: Obstetrics and Gynecology

## 2010-10-31 VITALS — BP 117/76 | HR 89 | Temp 98.6°F | Ht 65.0 in | Wt 170.5 lb

## 2010-10-31 DIAGNOSIS — Z01419 Encounter for gynecological examination (general) (routine) without abnormal findings: Secondary | ICD-10-CM

## 2010-10-31 NOTE — Patient Instructions (Signed)
It was great to see you today! You do not have a cervix so you do not need a pap smear. The rest of your exam is normal.  Keep on seeing your primary doctor and keep up with your mammograms like you have been. Come back to see [place] as needed.

## 2010-10-31 NOTE — Progress Notes (Signed)
Subjective: Currently sexually active with one female partner.  Has been with the same partner for the past three years.  No problems with discharge/bleeding.  No abdominal pain.  No history of STI's.  Does not remember any history of abnormal pap smear.  Has had a mammogram this year as well as a colonoscopy.  Hysterectomy in 1998 for fibroids.  Uncertain whether this included cervix or not.  Objective:  Filed Vitals:   10/31/10 1417  BP: 117/76  Pulse: 89  Temp: 98.6 F (37 C)   Gen: NAD, cooperative with exam. Pelvic: normal external genitalia, normal vaginal mucosa, no abnormal discharge.  Normal adnexa with no masses.  Cervix and uterus are surgically absent.  Well healed lower abdominal scar.  Assessment/Plan: Normal well woman exam.  No need for pap smears as cervix is surgically absent.  Continue standard preventative care per PCP.  F/U PRN.

## 2010-11-13 ENCOUNTER — Telehealth: Payer: Self-pay | Admitting: *Deleted

## 2010-11-13 NOTE — Telephone Encounter (Signed)
Call from pt asking when she is scheduled for her next MRI.  York Spaniel that she had been hospitalized for a stroke and was to have a repeat MRI.  Pt said that she was has been back to the Clinics and just wanted to get the MRI appointment. Call to Radiology no MRI has been scheduled.

## 2010-11-14 LAB — POCT CARDIAC MARKERS
CKMB, poc: 1.3
Myoglobin, poc: 65.7
Operator id: 1192
Troponin i, poc: <0.05

## 2010-11-14 LAB — COMPREHENSIVE METABOLIC PANEL
CO2: 28
Calcium: 9.5
Creatinine, Ser: 0.85
GFR calc non Af Amer: >60
Glucose, Bld: 118 — ABNORMAL HIGH
Sodium: 139
Total Protein: 6.9

## 2010-11-14 LAB — COMPREHENSIVE METABOLIC PANEL WITH GFR
ALT: 15
AST: 20
Albumin: 3.7
Alkaline Phosphatase: 78
BUN: 8
Chloride: 105
GFR calc Af Amer: >60
Potassium: 3.5
Total Bilirubin: 0.8

## 2010-11-14 LAB — CBC
HCT: 37
Hemoglobin: 12.9
MCHC: 34.8
MCV: 84
Platelets: 324
RBC: 4.4
RDW: 13.8
WBC: 9

## 2010-11-14 LAB — DIFFERENTIAL
Basophils Absolute: 0.1
Basophils Relative: 1
Eosinophils Absolute: 0.2
Eosinophils Relative: 3
Lymphocytes Relative: 36
Lymphs Abs: 3.2
Monocytes Absolute: 0.3
Monocytes Relative: 4
Neutro Abs: 5.1
Neutrophils Relative %: 57

## 2010-11-19 LAB — POCT RAPID STREP A: Streptococcus, Group A Screen (Direct): NEGATIVE

## 2010-12-12 ENCOUNTER — Other Ambulatory Visit: Payer: Self-pay | Admitting: *Deleted

## 2010-12-12 MED ORDER — HYDROCHLOROTHIAZIDE 25 MG PO TABS: 25.0000 mg | ORAL_TABLET | Freq: Every day | ORAL | Status: DC

## 2010-12-12 MED ORDER — PRAVASTATIN SODIUM 20 MG PO TABS: 40.0000 mg | ORAL_TABLET | Freq: Every day | ORAL | Status: DC

## 2011-02-04 ENCOUNTER — Encounter: Payer: Self-pay | Admitting: Internal Medicine

## 2011-02-04 ENCOUNTER — Ambulatory Visit (INDEPENDENT_AMBULATORY_CARE_PROVIDER_SITE_OTHER): Payer: Self-pay | Admitting: Internal Medicine

## 2011-02-04 VITALS — BP 100/70 | HR 70 | Temp 97.1°F | Ht 65.0 in | Wt 182.0 lb

## 2011-02-04 DIAGNOSIS — E785 Hyperlipidemia, unspecified: Secondary | ICD-10-CM

## 2011-02-04 MED ORDER — PRAVASTATIN SODIUM 20 MG PO TABS: 40.0000 mg | ORAL_TABLET | Freq: Every day | ORAL | Status: DC

## 2011-02-04 NOTE — Progress Notes (Signed)
Subjective:   Patient ID: [name] female   DOB: 11-Jun-1960 50 y.o.   MRN: [id number]   HPI: [name] is a 50 y.o.  She has PMH of SAH, stenosis of vertebral and iliac arteries, HTN, hyperlipidemia and tobacco abuse, who presents for a follow-up visit and for employment physical examination. She dose not have any complaints. Denies fever, chills, fatigue, headaches,  cough, chest pain, SOB,  abdominal pain,diarrhea, constipation, dysuria, urgency, frequency, hematuria, joint pain or leg swelling. She reports that since Oct, she has not taking her Pravachol due to financial difficulty. She would like to get this medication refilled today.   Past Medical History  Diagnosis Date  . Allergy   . Hypertension   . Hyperlipidemia   . Arthritis   . Stroke 07/16/201  . Sickle cell anemia     trait   Current Outpatient Prescriptions  Medication Sig Dispense Refill  . acetaminophen (TYLENOL) 500 MG tablet Take 1,000 mg by mouth every 6 (six) hours as needed.        . fish oil-omega-3 fatty acids 1000 MG capsule Take 1 g by mouth daily.        . hydrochlorothiazide (HYDRODIURIL) 25 MG tablet Take 1 tablet (25 mg total) by mouth daily.  30 tablet  5  . pravastatin (PRAVACHOL) 20 MG tablet Take 2 tablets (40 mg total) by mouth daily.  60 tablet  5  . DISCONTD: pravastatin (PRAVACHOL) 20 MG tablet Take 2 tablets (40 mg total) by mouth daily.  60 tablet  5  . atorvastatin (LIPITOR) 10 MG tablet Take 1 tablet (10 mg total) by mouth daily.  30 tablet  3  . loratadine (CLARITIN) 10 MG tablet Take 1 tablet (10 mg total) by mouth daily as needed for allergies.  30 tablet  0  . DISCONTD: pravastatin (PRAVACHOL) 20 MG tablet Take 1 tablet (20 mg total) by mouth daily.  30 tablet  3  . DISCONTD: pravastatin (PRAVACHOL) 20 MG tablet Take 4 tablets (80 mg total) by mouth daily.  30 tablet  3   Family History  Problem Relation Age of Onset  . Hyperlipidemia Mother   . Hypertension Mother   .  Diabetes Mother   . Hypertension Maternal Grandfather   . Diabetes Maternal Grandfather   . Heart disease Paternal Grandmother     died from mycardial infarcation   History   Social History  . Marital Status: Single    Spouse Name: N/A    Number of Children: N/A  . Years of Education: N/A   Social History Main Topics  . Smoking status: Current Everyday Smoker -- 0.5 packs/day  . Smokeless tobacco: Never Used  . Alcohol Use: Yes  . Drug Use: No  . Sexually Active: Yes    Birth Control/ Protection: Surgical, Condom   Other Topics Concern  . None   Social History Narrative   Financial assistance approved for 100% discount at [facility] and has [facility] card per [place]1/19/2012   Review of Systems: Constitutional: Denies fever, chills, diaphoresis, appetite change and fatigue.  HEENT: Denies photophobia, eye pain, redness, hearing loss, ear pain, congestion, sore throat, rhinorrhea, sneezing, mouth sores, trouble swallowing, neck pain, neck stiffness and tinnitus.   Respiratory: Denies SOB, DOE, cough, chest tightness,  and wheezing.   Cardiovascular: Denies chest pain, palpitations and leg swelling.  Gastrointestinal: Denies nausea, vomiting, abdominal pain, diarrhea, constipation, blood in stool and abdominal distention.  Genitourinary: Denies dysuria, urgency, frequency, hematuria, flank pain and  difficulty urinating.  Musculoskeletal: Denies myalgias, back pain, joint swelling, arthralgias and gait problem.  Skin: Denies pallor, rash and wound.  Neurological: Denies dizziness, seizures, syncope, weakness, light-headedness, numbness and headaches.  Hematological: Denies adenopathy. Easy bruising, personal or family bleeding history  Psychiatric/Behavioral: Denies suicidal ideation, mood changes, confusion, nervousness, sleep disturbance and agitation  Objective:  Physical Exam: Filed Vitals:   02/04/11 1341  BP: 100/70  Pulse: 70  Temp: 97.1 F (36.2 C)  TempSrc: Oral    Height: 5\' 5"  (1.651 m)  Weight: 82.555 kg (182 lb)  SpO2: 100%    Vitals: T:  97.1     HR:       BP: 100/60      RR:        O2 saturation:  96%  General: not in acute distress. HEENT: PERRL, EOMI, no scleral icterus. No exudate from ear or nose. Grossly normal hearing and vision. Cardiac: S1/S2, RRR, No murmurs, gallops or rubs Pulm: Good air movement bilaterally, Clear to auscultation bilaterally, No rales, wheezing, rhonchi or rubs. Abd: Soft,  nondistended, nontender, no rebound pain, no organomegaly, BS present Ext: No rashes or edema, 2+DP/PT pulse bilaterally Neuro: alert and oriented X3, cranial nerves II-XII grossly intact, muscle strength 5/5 in all extremeties,  sensation to light touch intact.     Assessment & Plan:   Hypertension - Lorretta Harp, MD   Her HTN is well controlled. Today her Bp is 100/60 mmHg.  Will continue her current regimen and follow-up.  Subarachnoid hemorrhage - Lorretta Harp, MD  Patient is free of symptoms. Blood pressure is well controlled by HCTD. Her Bp is 100/60 mmHg today. No new issue. Will follow up.  Delena Serve, MD  Patient has been on 40 mg of Pravachol daily. But her lipid panel results showed that her CH, TG, HDL and LDL were 293, 163, 58 and 161, respectively on July, 2012. Due to financial difficulty, she has not taking Pravachol since Oct, 2012. Patient is to restart this medication from now. Her AST/ALT was normal on July, 2012. Will follow up in 3 month.  TOBACCO ABUSE - Lorretta Harp, MD  She was encouraged to quit smoking and lives with a healthier life style. She told me that she cut down her smoking to 3 cigarettes/day now. She would like to consider to quit.   Physical for employment: will get TB test toady.

## 2011-02-04 NOTE — Assessment & Plan Note (Signed)
A 

## 2011-02-04 NOTE — Patient Instructions (Addendum)
Please come back at Thursday (48 hours to 72 hours) for checking your TB test result. Please take the medications as prescribed. I prescribed pravastatin (Pravachol) today.

## 2011-02-04 NOTE — Progress Notes (Signed)
Ms. Govan history and physical examination were reviewed with Dr. Clyde Lundborg and the assessment and plan were formulated together.  I agree with his documentation.

## 2011-02-06 LAB — TB SKIN TEST: TB Skin Test: NEGATIVE mm

## 2011-02-25 DIAGNOSIS — I639 Cerebral infarction, unspecified: Secondary | ICD-10-CM

## 2011-02-25 HISTORY — DX: Cerebral infarction, unspecified: I63.9

## 2011-04-29 ENCOUNTER — Encounter: Payer: Self-pay | Admitting: Internal Medicine

## 2011-06-30 ENCOUNTER — Other Ambulatory Visit: Payer: Self-pay | Admitting: Internal Medicine

## 2011-06-30 DIAGNOSIS — I10 Essential (primary) hypertension: Secondary | ICD-10-CM

## 2011-07-10 ENCOUNTER — Ambulatory Visit (INDEPENDENT_AMBULATORY_CARE_PROVIDER_SITE_OTHER): Payer: Self-pay | Admitting: Internal Medicine

## 2011-07-10 ENCOUNTER — Encounter: Payer: Self-pay | Admitting: Internal Medicine

## 2011-07-10 VITALS — BP 118/68 | HR 73 | Temp 98.3°F | Resp 20 | Ht 64.5 in | Wt 187.1 lb

## 2011-07-10 DIAGNOSIS — E785 Hyperlipidemia, unspecified: Secondary | ICD-10-CM

## 2011-07-10 DIAGNOSIS — I10 Essential (primary) hypertension: Secondary | ICD-10-CM

## 2011-07-10 MED ORDER — PRAVASTATIN SODIUM 40 MG PO TABS: 80.0000 mg | ORAL_TABLET | Freq: Every evening | ORAL | Status: DC

## 2011-07-10 NOTE — Patient Instructions (Signed)
Schedule a followup appointment with your primary care provider in one to 3 months to recheck her blood pressure. You may stop taking your HCTZ. Try to check her blood pressure when you are out at [employer] and right the number down. If you notice that the top number of your blood pressure is staying above 140, call the clinic to schedule an appointment. Your Pravachol has been increased to 80 mg a day. Take two 40 mg tablets daily. Your prescription was electronically submitted to your [facility] pharmacy. I will contact you if any of your blood work is abnormal. Contact the clinic with any concerns, questions or side effects to your medications at [phone]

## 2011-07-11 LAB — COMPREHENSIVE METABOLIC PANEL
Albumin: 4.1 g/dL (ref 3.5–5.2)
Alkaline Phosphatase: 77 U/L (ref 39–117)
BUN: 9 mg/dL (ref 6–23)
Creat: 0.67 mg/dL (ref 0.50–1.10)
Glucose, Bld: 95 mg/dL (ref 70–99)
Potassium: 3.8 mEq/L (ref 3.5–5.3)

## 2011-07-11 LAB — LIPID PANEL
HDL: 37 mg/dL — ABNORMAL LOW (ref >39)
LDL Cholesterol: 204 mg/dL — ABNORMAL HIGH (ref 0–99)
Total CHOL/HDL Ratio: 7.8 Ratio
Triglycerides: 229 mg/dL — ABNORMAL HIGH (ref <150)

## 2011-07-11 NOTE — Assessment & Plan Note (Addendum)
Patient has a history of hypertension however review of the EMR reveals only one instance of elevated blood pressure with a systolic of 141 in 2009. Patient states she recalls that day and was very agitated and had smoked cigarettes prior to her office visit.  She has not taken HCTZ for the past 2 weeks and her blood pressure today is well within acceptable limits with a systolic of 118. I do not believe the patient needs to continue any antihypertensives at this time.  She states she would prefer not to take a medicine that she does not need it. We'll plan to have her stop taking her HCTZ and followup in one to 2 months for blood pressure check and repeat lipid profile with liver function tests.  She states she will check her blood pressure when she is at [employer]. I have asked her to contact the clinic if she has multiple systolics greater than 140.

## 2011-07-11 NOTE — Assessment & Plan Note (Signed)
LDL above goal at her last office visit. Unfortunately she was unable to afford Lipitor. We'll repeat a lipid profile today, increase her Pravachol to 80 mg daily, and check liver function tests.  Will plan to repeat lipid profile and LFTs at her next office visit; anticipate it will be more than 6 weeks before she returns to the clinic.

## 2011-07-11 NOTE — Progress Notes (Signed)
Patient ID: [name], female   DOB: [birth year], 51 y.o.   MRN: [id number]  Subjective:     HPI:  Patient is a 51 year old female here today for medication refill. She states she has been out of her HCTZ for 2 weeks and out of her cholesterol medication for the past 2 months.  She denies experiencing any adverse effect with her medications.  She reports an episode of increased swelling in her feet that occurred over the weekend. She believes this may be the result of wearing 6 inch heels.  She notes the swelling has improved.  She denies chest pain, shortness of breath, dyspnea on exertion, dizziness, syncope, or other associated symptom.  She has no concerns or complaints today.   Review of Systems Constitutional: Negative for fever, chills, diaphoresis, activity change, appetite change, fatigue and unexpected weight change.  HENT: Negative for hearing loss, congestion and neck stiffness.   Eyes: Negative for photophobia, pain and visual disturbance.  Respiratory: Negative for cough, chest tightness, shortness of breath and wheezing.   Cardiovascular: Negative for chest pain and palpitations.  Gastrointestinal: Negative for abdominal pain, blood in stool and anal bleeding.  Genitourinary: Negative for dysuria, hematuria and difficulty urinating.  Musculoskeletal: Negative for joint swelling.  Neurological: Negative for dizziness, syncope, speech difficulty, weakness, numbness and headaches. ]     Objective:   Physical Exam VItal signs reviewed and stable. GEN: No apparent distress.  Alert and oriented x 3.  Pleasant, conversant, and cooperative to exam. HEENT: head is autraumatic and normocephalic.  Neck is supple without palpable masses or lymphadenopathy.  Vision intact.  Sclerae anicteric.  Conjunctivae without pallor or injection.   RESP:  Lungs are clear to ascultation bilaterally with good air movement.  No wheezes, ronchi, or rubs. CARDIOVASCULAR: regular rate, normal rhythm.   Clear S1, S2, no murmurs, gallops, or rubs. EXT: warm and dry.  Peripheral pulses equal, intact, and +2 globally.  No clubbing or cyanosis.  No edema in bilateral lower extremities. SKIN: warm and dry with normal turgor.  No rashes or abnormal lesions observed. NEURO: No focal deficit.     Assessment/Plan:

## 2011-08-04 ENCOUNTER — Ambulatory Visit (INDEPENDENT_AMBULATORY_CARE_PROVIDER_SITE_OTHER): Payer: Self-pay | Admitting: Internal Medicine

## 2011-08-04 ENCOUNTER — Encounter: Payer: Self-pay | Admitting: Internal Medicine

## 2011-08-04 VITALS — BP 142/81 | HR 88 | Temp 97.0°F | Ht 64.0 in | Wt 186.7 lb

## 2011-08-04 DIAGNOSIS — R519 Headache, unspecified: Secondary | ICD-10-CM

## 2011-08-04 DIAGNOSIS — R51 Headache: Secondary | ICD-10-CM

## 2011-08-04 MED ORDER — GABAPENTIN 300 MG PO CAPS: 300.0000 mg | ORAL_CAPSULE | Freq: Three times a day (TID) | ORAL | Status: DC

## 2011-08-04 NOTE — Assessment & Plan Note (Addendum)
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 Her pain is sharp and paroxysmal with baseline soreness and fits the classic distribution. While she does have some left arm tingling, there is no evidence of sinus infection or neurologic deficit that would be consistent with repeat stroke. Her TM erythema is likely secondary to using matchsticks to scratch the inside of her ears. We will empirically treat for occipital neuralgia. She will use gabapentin 300 mg 3 times a day with a 3 day taper. If her symptoms are not resolved in 2 weeks, she is to call and we will refer her to pain therapy for an occipital nerve injection which is the definitive therapy for occipital neuralgia. If she's not improved in one month we will have her taper off gabapentin and call us for an appointment to be seen again by her PCP. I also asked her to discontinue using matches to scratch the inside her ears.

## 2011-08-04 NOTE — Patient Instructions (Signed)
You likely have occipital neuralgia. We will place her on a medicine that treats nerve pain. Please begin taking this medication slowly as it can make you drowsy. Please follow this course to build up to taking one gabapentin tablet 3 times daily (Day 1: 300 mg, Day 2: 300 mg twice daily, Day 3: 300 mg 3 times/day). Please call us if you're not better in 2 weeks. At that time, we will refer you to pain medicine for a possible injection of numbing medicine into this area. If you're not better in one month please follow a similar taper to stop taking gabapentin and call to have an appointment made. Please stop taking in your ears with match sticks.  Occipital Neuralgia Neuralgias are attacks of sharp stabbing pain. They may be intermittent (comes and goes) or constant in nature. They may be brief attacks that last seconds to minutes and may come back for days to weeks. The neuralgias can occur as a result of a herpes zoster (shingles), chickenpox infection, or even following a herpes simplex infection (cold sore). TYPES OF NEURALGIA  When these pains are located in the back of the head and neck they are called occipital neuralgias.   When the pain is located between ribs it is called intercostal neuralgia.   When the pain is located in the face it is called trigeminal neuralgia. This is the most common neuralgia. It causes sharp, shock like pain on one side of your face.  The attacks of pain may come from injury or inflammation (irritation) to a nerve. Often the cause is unknown. The episodes of pain may be caused by light touch, movement, or even eating and sneezing. Usually these neuralgias occur after age 19. The neuralgias following shingles and trigeminal neuralgia are the most common. Although painful, these episodes do not threaten life and tend to lessen as we grow older. TREATMENT  There are many medications that may be helpful in the treatment of this disorder. Sometimes several medications  may have to be tried before the right combination can be found for you. Some of these medications are:  Only take over-the-counter or prescription medications for pain, discomfort, or fever as directed by your caregiver.   Narcotic medications may be used to control the pain.   Antidepressants and medications used in epilepsy (seizure disorders) may be useful.  LET YOUR CAREGIVER KNOW ABOUT:  If you do not obtain relief from medications.   Problems that are getting worse rather than better.   Troubling side effects that you think are coming from the medication.  Do not be discouraged if you do not obtain instant relief from the medications or help given you. Your caregiver can help you get through these episodes of pain with some persistence (continued trying) on your part also. Document Released: 02/04/2001 Document Revised: 01/30/2011 Document Reviewed: 02/10/2005 [facility] Patient Information 2012 [place], [place].

## 2011-08-04 NOTE — Progress Notes (Signed)
Subjective:     Patient ID: [name], female   DOB: 11-10-1960, 51 y.o.   MRN: [id number]  Headache  This is a new problem. Episode onset: 4 days ago. No inciting factors. The problem occurs constantly (constant pain with short bursts of exacerbation). The problem has been gradually worsening. The pain is located in the left unilateral, temporal and occipital region. The pain does not radiate. The pain quality is not similar to prior headaches. The quality of the pain is described as aching and sharp. The pain is severe. Associated symptoms include ear pain, muscle aches, neck pain and scalp tenderness. Pertinent negatives include no abnormal behavior, blurred vision, coughing, dizziness, drainage, eye pain, eye redness, eye watering, fever, hearing loss, nausea, numbness, rhinorrhea, sinus pressure, sore throat, swollen glands, tingling, visual change, vomiting or weakness. Associated symptoms comments: No sinus pressure. No swollen glands. No pain with eating hot or cold foods.  No tooth pain or pain with opening mouth wide or chewing.. The symptoms are aggravated by activity (palpation of head and scalp and arm raise). She has tried NSAIDs (Tried ibuprofen and massage) for the symptoms. The treatment provided no relief. Her past medical history is significant for hypertension. There is no history of obesity. (History of SAH 1 year ago)     Review of Systems  Constitutional: Negative for fever, chills, diaphoresis and fatigue.  HENT: Positive for ear pain and neck pain. Negative for hearing loss, sore throat, rhinorrhea and sinus pressure.   Eyes: Negative for blurred vision, pain, discharge, redness and visual disturbance.  Respiratory: Negative for cough and shortness of breath.   Cardiovascular: Negative for chest pain.  Gastrointestinal: Negative for nausea and vomiting.  Musculoskeletal: Positive for myalgias.  Neurological: Positive for headaches. Negative for dizziness, tingling,  tremors, facial asymmetry, speech difficulty, weakness, light-headedness and numbness.  Hematological: Negative for adenopathy.  Psychiatric/Behavioral: Negative for confusion.       Objective:   Physical Exam  Constitutional: She is oriented to person, place, and time. She appears well-developed and well-nourished. No distress.  HENT:  Head: Normocephalic and atraumatic. No trismus in the jaw.  Right Ear: Hearing, tympanic membrane, external ear and ear canal normal. No lacerations. No drainage, swelling or tenderness. No foreign bodies. No mastoid tenderness. Tympanic membrane is not injected, not scarred, not perforated, not erythematous, not retracted and not bulging. No middle ear effusion. No hemotympanum. No decreased hearing is noted.  Left Ear: Hearing and ear canal normal. No lacerations. There is tenderness. No drainage or swelling. No foreign bodies. There is mastoid tenderness. Tympanic membrane is erythematous. Tympanic membrane is not scarred, not perforated, not retracted and not bulging.  No middle ear effusion. No hemotympanum. No decreased hearing is noted.  Ears:  Nose: Nose normal.  Mouth/Throat: Uvula is midline, oropharynx is clear and moist and mucous membranes are normal. No oral lesions. Dental caries present. No dental abscesses. No oropharyngeal exudate, posterior oropharyngeal edema, posterior oropharyngeal erythema or tonsillar abscesses.       Erythema present on the superior left tympanic membrane. No erythema of auricular canal. No pain with manipulation of the pinna bilaterally. No pain on palpation of the frontal or maxillary sinuses. No sinus pain with Valsalva or placing head between legs. There is pain on palpation of the temporal and occipital scalp on the left side.  Eyes: Conjunctivae are normal. Right eye exhibits no discharge. Left eye exhibits no discharge.  Neck: Trachea normal, normal range of motion and phonation normal. Neck  supple. Muscular  tenderness present. No rigidity. No tracheal deviation, no edema, no erythema and normal range of motion present.  Cardiovascular: Normal rate, regular rhythm and normal heart sounds.   Pulmonary/Chest: Effort normal and breath sounds normal.  Musculoskeletal:       Pain is increased with raising arms above head both passively and actively. There is muscular tenderness in the left lateral neck musculature. No warmth erythema or effusions present.  Neurological: She is alert and oriented to person, place, and time. She has normal strength. She displays no tremor. No cranial nerve deficit or sensory deficit. She exhibits abnormal muscle tone. Coordination normal.  Skin: She is not diaphoretic.       Assessment:     Case discussed with Dr. Rogelia Boga. Please see problem oriented charting for assessment and plan by problem (best viewed under encounters tab).

## 2012-03-04 ENCOUNTER — Encounter: Payer: Self-pay | Admitting: Internal Medicine

## 2012-03-16 ENCOUNTER — Encounter: Payer: Self-pay | Admitting: Internal Medicine

## 2012-03-16 ENCOUNTER — Ambulatory Visit (INDEPENDENT_AMBULATORY_CARE_PROVIDER_SITE_OTHER): Payer: Self-pay | Admitting: Internal Medicine

## 2012-03-16 VITALS — BP 140/70 | Temp 97.5°F | Ht 64.0 in | Wt 182.0 lb

## 2012-03-16 DIAGNOSIS — E785 Hyperlipidemia, unspecified: Secondary | ICD-10-CM

## 2012-03-16 DIAGNOSIS — F17200 Nicotine dependence, unspecified, uncomplicated: Secondary | ICD-10-CM

## 2012-03-16 DIAGNOSIS — I609 Nontraumatic subarachnoid hemorrhage, unspecified: Secondary | ICD-10-CM

## 2012-03-16 DIAGNOSIS — Z0000 Encounter for general adult medical examination without abnormal findings: Secondary | ICD-10-CM

## 2012-03-16 NOTE — Assessment & Plan Note (Addendum)
Patient did not have new symptoms. Blood pressure is 140/70 mmHg without medications today. According to previous discharge summary by Dr. Pearlean Brownie on 09/08/2010, patient was supposed to get MRI at 2 month later for looking for the source of bleeding. I could not find MRI done.  Will discuss with pt in her next visit.

## 2012-03-16 NOTE — Assessment & Plan Note (Signed)
  Assessment:  Progress toward smoking cessation:  smoking the same amount  Barriers to progress toward smoking cessation:  lack of motivation to quit  Comments:   Plan:  Instruction/counseling given:  I advised patient to stop smoking.  Educational resources provided:  Quitline[employer]) brochure  Self management tools provided:  smoking cessation plan (STAR Quit Plan)  Medications to assist with smoking cessation:  Nicotine Patch, but patient is not ready to use it. She would like to consider it seriously from now.  Patient agreed to the following self-care plans for smoking cessation:      Other:

## 2012-03-16 NOTE — Patient Instructions (Signed)
1. I will let you know if you need to re-start medication for high cholesterol. 2. If you have worsening of your symptoms or new symptoms arise, please call the clinic [phone]), or go to the ER immediately if symptoms are severe.

## 2012-03-16 NOTE — Assessment & Plan Note (Signed)
Patient's LDL was 204 at 07/10/11. She was supposed to take pravastatin 40 mg daily. But she has not taken this medication for several months. Will check her lipid profile to decide whether she needs to restart these medication.

## 2012-03-16 NOTE — Assessment & Plan Note (Signed)
-  Patient does not need pap smear, since had hystorectomy and cervix was removed on 1998. 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

## 2012-03-16 NOTE — Progress Notes (Addendum)
Patient ID: [name], female   DOB: 1960-10-21, 52 y.o.   MRN: [id number] Subjective:   Patient ID: [name] female   DOB: 1960/05/19 52 y.o.   MRN: [id number]  CC: follow up visit.  HPI:  [name] is a 52 y.o. lady with past medical history as outlined below, who presents for a followup visit.    Patient reports that she feels good without any new complaints. Patient reports that she has not been taking any medications in past several months. She was supposed to take pravastatin 40 mg daily for her hyperlipidemia, but the patient did not take this medication partially because of financial difficulty. She still smokes half pack a day. She is not ready to quit smoking yet. Patient reports that she is eating healthy foods including vegetables and fruits. She is considering to start any exercise.  Denies fever, chills, fatigue, headaches,  cough, chest pain, SOB,  abdominal pain,diarrhea, constipation, dysuria, urgency, frequency, hematuria, joint pain or leg swelling.    Past Medical History  Diagnosis Date  . Allergy   . Hypertension   . Hyperlipidemia   . Arthritis   . Stroke 07/16/201  . Sickle cell anemia     trait   Current Outpatient Prescriptions  Medication Sig Dispense Refill  . pravastatin (PRAVACHOL) 40 MG tablet Take 2 tablets (80 mg total) by mouth every evening.  60 tablet  6   Family History  Problem Relation Age of Onset  . Hyperlipidemia Mother   . Hypertension Mother   . Diabetes Mother   . Hypertension Maternal Grandfather   . Diabetes Maternal Grandfather   . Heart disease Paternal Grandmother     died from mycardial infarcation   History   Social History  . Marital Status: Single    Spouse Name: N/A    Number of Children: N/A  . Years of Education: N/A   Social History Main Topics  . Smoking status: Current Every Day Smoker -- 0.5 packs/day    Types: Cigarettes  . Smokeless tobacco: Never Used  . Alcohol Use: Yes  . Drug Use: No   . Sexually Active: Yes    Birth Control/ Protection: Surgical, Condom   Other Topics Concern  . None   Social History Narrative   Financial assistance approved for 100% discount at [facility] and has [facility] card per [place]1/19/2012    Review of Systems: General: no fevers, chills, no changes in body weight, no changes in appetite Skin: no rash HEENT: no blurry vision, hearing changes or sore throat Pulm: no dyspnea, coughing, wheezing CV: no chest pain, palpitations, shortness of breath Abd: no nausea/vomiting, abdominal pain, diarrhea/constipation GU: no dysuria, hematuria, polyuria Ext: no arthralgias, myalgias Neuro: no weakness, numbness, or tingling  Objective:  Physical Exam: Filed Vitals:   03/16/12 1515  BP: 140/70  Temp: 97.5 F (36.4 C)  TempSrc: Oral  Height: 5\' 4"  (1.626 m)  Weight: 182 lb (82.555 kg)  SpO2: 99%    Vitals: T:       HR:       BP:       RR:        O2 saturation:    General: Not in acute distress HEENT: PERRL, EOMI, no scleral icterus, No bruit or JVD Cardiac: S1/S2, RRR, No murmurs, gallops or rubs Pulm: Good air movement bilaterally, Clear to auscultation bilaterally, No rales, wheezing, rhonchi or rubs. Abd: Soft,  nondistended, nontender, no rebound pain, no organomegaly, BS present Ext: No rashes  or edema, 2+DP/PT pulse bilaterally Musculoskeletal: No joint deformities, erythema, or stiffness, ROM full and nontender Skin: no rashes. No skin bruise. Neuro: Alert and oriented X3, cranial nerves II-XII grossly intact, muscle strength 5/5 in all extremeties,  sensation to light touch intact. Brachial reflexes 1+ bilaterally. Knee reflexes 2+ bilaterally. Babinski's sign negative. Psych: patient is not psychotic, no suicidal or hemocidal ideation.    Assessment & Plan:   Addendum  Patient's LDL is 189. Her goal of LDL should be <100 given her hx of stroke. I sent a prescription of pravastatin 80 mg daily to her pharmacy and left a message  to her home phone today.   Lorretta Harp, MD PGY2, Internal Medicine Teaching Service Pager: [phone]

## 2012-03-17 ENCOUNTER — Other Ambulatory Visit: Payer: Self-pay | Admitting: Internal Medicine

## 2012-03-17 DIAGNOSIS — E785 Hyperlipidemia, unspecified: Secondary | ICD-10-CM

## 2012-03-17 LAB — LIPID PANEL
Total CHOL/HDL Ratio: 8.8 Ratio
Triglycerides: 300 mg/dL — ABNORMAL HIGH (ref <150)
VLDL: 60 mg/dL — ABNORMAL HIGH (ref 0–40)

## 2012-03-17 MED ORDER — PRAVASTATIN SODIUM 40 MG PO TABS: 80.0000 mg | ORAL_TABLET | Freq: Every evening | ORAL | Status: DC

## 2012-07-21 ENCOUNTER — Other Ambulatory Visit: Payer: Self-pay

## 2012-07-21 DIAGNOSIS — Z1231 Encounter for screening mammogram for malignant neoplasm of breast: Secondary | ICD-10-CM

## 2012-08-03 ENCOUNTER — Ambulatory Visit
Admission: RE | Admit: 2012-08-03 | Discharge: 2012-08-03 | Disposition: A | Discharge status: Home / Self Care | Payer: BC Managed Care – PPO | Source: Ambulatory Visit

## 2012-08-03 DIAGNOSIS — Z1231 Encounter for screening mammogram for malignant neoplasm of breast: Secondary | ICD-10-CM

## 2012-08-17 ENCOUNTER — Encounter: Payer: Self-pay | Admitting: Internal Medicine

## 2012-08-17 ENCOUNTER — Ambulatory Visit (INDEPENDENT_AMBULATORY_CARE_PROVIDER_SITE_OTHER): Payer: BC Managed Care – PPO | Admitting: Internal Medicine

## 2012-08-17 VITALS — BP 120/80 | HR 80 | Temp 98.0°F | Ht 64.0 in | Wt 183.2 lb

## 2012-08-17 DIAGNOSIS — Z23 Encounter for immunization: Secondary | ICD-10-CM

## 2012-08-17 DIAGNOSIS — F17200 Nicotine dependence, unspecified, uncomplicated: Secondary | ICD-10-CM

## 2012-08-17 DIAGNOSIS — E785 Hyperlipidemia, unspecified: Secondary | ICD-10-CM

## 2012-08-17 DIAGNOSIS — Z0000 Encounter for general adult medical examination without abnormal findings: Secondary | ICD-10-CM

## 2012-08-17 DIAGNOSIS — I609 Nontraumatic subarachnoid hemorrhage, unspecified: Secondary | ICD-10-CM

## 2012-08-17 MED ORDER — ATORVASTATIN CALCIUM 40 MG PO TABS: 40.0000 mg | ORAL_TABLET | Freq: Every day | ORAL | Status: AC

## 2012-08-17 NOTE — Assessment & Plan Note (Signed)
Given her history of stroke [facility]), her LDL goal should be less than 100. Currently she is not taking medications due to noncompliance. Patient would like to start taking lipitor from now. Her previous LDL was 189, will start her with Lipitor 40 mg daily.

## 2012-08-17 NOTE — Progress Notes (Signed)
Case discussed with Dr. Niu (at time of visit, soon after the resident saw the patient).  We reviewed the resident's history and exam and pertinent patient test results.  I agree with the assessment, diagnosis, and plan of care documented in the resident's note.   

## 2012-08-17 NOTE — Assessment & Plan Note (Signed)
  Assessment: Progress toward smoking cessation:  smoking the same amount Barriers to progress toward smoking cessation:  lack of motivation to quit Comments:   Plan: Instruction/counseling given:  I counseled patient on the dangers of tobacco use, advised patient to stop smoking, and reviewed strategies to maximize success. Educational resources provided:  QuitlineNC [employer], [employer]) brochure Self management tools provided:    Medications to assist with smoking cessation:  offered nicotin patch, refused Patient agreed to the following self-care plans for smoking cessation:    Other plans: spend 10 min to have done counseling on quitting smoking.

## 2012-08-17 NOTE — Assessment & Plan Note (Signed)
It is stable. No new issues. We'll followup,

## 2012-08-17 NOTE — Patient Instructions (Signed)
1. Please start taking Lipitor 4o mg daily from now.  2. Please take all medications as prescribed.  3. If you have worsening of your symptoms or new symptoms arise, please call the clinic ([phone]), or go to the ER immediately if symptoms are severe.

## 2012-08-17 NOTE — Assessment & Plan Note (Signed)
-  will give Tdp shot today.

## 2012-08-17 NOTE — Progress Notes (Signed)
Subjective:   Patient ID: [name] female   DOB: Nov 01, 1960 52 y.o.   MRN: [id number]  CC:  Follow up visit.  HPI:  [name] is a 52 y.o. lady with past medical history as outlined below, who presents for a followup visit today.  1. HLD: Patient's LDL was 189 on 03/16/12. Patient was given a prescription of pravastatin 40 mg daily. However patient did not take his medication due to financial difficulties. She says she is going to have a new job at [employer] soon. Therefore she will have insurance and will be able to afford for medications. She would like to start taking cholesterol medication from now. 2. Smoking: patient has been smoking for>30 years.  she said recently she has cut down a little bit. But she is still smoking half pack a day.  3. SAH: patient has a small S. HD on 09/08/10. There is no new episode of a headache. No weakness, numbness or decreased sensations in her extremities. Her blood pressure is well controlled. She's not on any blood pressure medications currently. Her blood pressure is normal today.  4. Vaccination: because of the new job requirement, patient needs to get tDap vaccination today.   ROS: denies fever, chills, fatigue, headaches, cough, chest pain, SOB,  abdominal pain,diarrhea, constipation, dysuria, urgency, frequency, hematuria.   Past Medical History  Diagnosis Date  . Allergy   . Hypertension   . Hyperlipidemia   . Arthritis   . Stroke 07/16/201  . Sickle cell anemia     trait   Current Outpatient Prescriptions  Medication Sig Dispense Refill  . atorvastatin (LIPITOR) 40 MG tablet Take 1 tablet (40 mg total) by mouth daily.  30 tablet  5   No current facility-administered medications for this visit.   Family History  Problem Relation Age of Onset  . Hyperlipidemia Mother   . Hypertension Mother   . Diabetes Mother   . Hypertension Maternal Grandfather   . Diabetes Maternal Grandfather   . Heart disease  Paternal Grandmother     died from mycardial infarcation   History   Social History  . Marital Status: Single    Spouse Name: N/A    Number of Children: N/A  . Years of Education: N/A   Social History Main Topics  . Smoking status: Current Every Day Smoker -- 0.50 packs/day    Types: Cigarettes  . Smokeless tobacco: Never Used  . Alcohol Use: Yes  . Drug Use: No  . Sexually Active: Yes    Birth Control/ Protection: Surgical, Condom   Other Topics Concern  . Not on file   Social History Narrative   Financial assistance approved for 100% discount at [facility] and has [facility] card per Rudell Cobb   03/14/2010          Review of Systems: Full 14-point review of systems otherwise negative. See HPI.   Objective:  Physical Exam: Filed Vitals:   08/17/12 1616  BP: 120/80  Pulse: 80  Temp: 98 F (36.7 C)  TempSrc: Oral  Height: 5\' 4"  (1.626 m)  Weight: 183 lb 3.2 oz (83.099 kg)  SpO2: 100%   General: Not in acute distress HEENT: PERRL, EOMI, no scleral icterus, No bruit or JVD Cardiac: S1/S2, RRR, No murmurs, gallops or rubs Pulm: Good air movement bilaterally, Clear to auscultation bilaterally, No rales, wheezing, rhonchi or rubs. Abd: Soft,  nondistended, nontender, no rebound pain, no organomegaly, BS present Ext: No rashes or edema, 2+DP/PT pulse  bilaterally Musculoskeletal: No joint deformities, erythema, or stiffness, ROM full and nontender Skin: no rashes. No skin bruise. Neuro: Alert and oriented X3, cranial nerves II-XII grossly intact, muscle strength 5/5 in all extremeties,  sensation to light touch intact. Brachial reflexes 1+ bilaterally. Knee reflexes 2+ bilaterally. Babinski's sign negative. Psych: patient is not psychotic, no suicidal or hemocidal ideation.   Assessment & Plan:

## 2013-02-10 ENCOUNTER — Other Ambulatory Visit: Payer: Self-pay | Admitting: Gastroenterology

## 2013-04-20 ENCOUNTER — Encounter: Payer: Self-pay | Admitting: *Deleted

## 2013-04-20 ENCOUNTER — Ambulatory Visit (INDEPENDENT_AMBULATORY_CARE_PROVIDER_SITE_OTHER): Payer: BC Managed Care – PPO | Admitting: Family Medicine

## 2013-04-20 VITALS — BP 106/62 | HR 94 | Temp 98.8°F | Resp 16 | Ht 64.5 in | Wt 185.0 lb

## 2013-04-20 DIAGNOSIS — R6889 Other general symptoms and signs: Secondary | ICD-10-CM

## 2013-04-20 DIAGNOSIS — R059 Cough, unspecified: Secondary | ICD-10-CM

## 2013-04-20 DIAGNOSIS — R52 Pain, unspecified: Secondary | ICD-10-CM

## 2013-04-20 DIAGNOSIS — R05 Cough: Secondary | ICD-10-CM

## 2013-04-20 DIAGNOSIS — F17200 Nicotine dependence, unspecified, uncomplicated: Secondary | ICD-10-CM

## 2013-04-20 DIAGNOSIS — R51 Headache: Secondary | ICD-10-CM

## 2013-04-20 LAB — POCT CBC
Granulocyte percent: 66.9 %G (ref 37–80)
HCT, POC: 46.4 % (ref 37.7–47.9)
HEMOGLOBIN: 14.8 g/dL (ref 12.2–16.2)
LYMPH, POC: 1.1 (ref 0.6–3.4)
MCH: 29 pg (ref 27–31.2)
MCHC: 31.9 g/dL (ref 31.8–35.4)
MCV: 91 fL (ref 80–97)
MID (CBC): 0.3 (ref 0–0.9)
MPV: 11.4 fL (ref 0–99.8)
PLATELET COUNT, POC: 229 10*3/uL (ref 142–424)
POC GRANULOCYTE: 2.9 (ref 2–6.9)
POC LYMPH PERCENT: 25.4 %L (ref 10–50)
POC MID %: 7.7 % (ref 0–12)
RBC: 5.1 M/uL (ref 4.04–5.48)
RDW, POC: 14.5 %
WBC: 4.3 10*3/uL — AB (ref 4.6–10.2)

## 2013-04-20 LAB — POCT INFLUENZA A/B
Influenza A, POC: NEGATIVE
Influenza B, POC: NEGATIVE

## 2013-04-20 MED ORDER — ONDANSETRON 4 MG PO TBDP: 4.0000 mg | ORAL_TABLET | Freq: Three times a day (TID) | ORAL | Status: DC | PRN

## 2013-04-20 NOTE — Progress Notes (Signed)
Subjective: Patient has been ill since night before last which started getting a little change in her voice. In the morning yesterday she was achy all over. He is felt lousy. She's had some nausea and vomiting. No diarrhea. She has had a little a nose and cough. Has been sneezing some. She has felt feverish and achy. She took ibuprofen. She did not measure her temperature before taking that. She works and goes to [employer].  Objective: Ill-appearing lady laying on the exam table. Her TMs are normal. Throat not erythematous. No exudate. Neck supple without nodes or tenderness. Chest clear. Heart regular with a grade 2/6 systolic murmur easily heard in the right and left upper sternal borders. Abdomen soft without mass or tenderness.  Assessment: Flulike illness with cough and aching and nausea Murmur  Plan: Results for orders placed in visit on 04/20/13  POCT INFLUENZA A/B      Result Value Ref Range   Influenza A, POC Negative     Influenza B, POC Negative    POCT CBC      Result Value Ref Range   WBC 4.3 (*) 4.6 - 10.2 K/uL   Lymph, poc 1.1  0.6 - 3.4   POC LYMPH PERCENT 25.4  10 - 50 %L   MID (cbc) 0.3  0 - 0.9   POC MID % 7.7  0 - 12 %M   POC Granulocyte 2.9  2 - 6.9   Granulocyte percent 66.9  37 - 80 %G   RBC 5.10  4.04 - 5.48 M/uL   Hemoglobin 14.8  12.2 - 16.2 g/dL   HCT, POC 46.4  37.7 - 47.9 %   MCV 91.0  80 - 97 fL   MCH, POC 29.0  27 - 31.2 pg   MCHC 31.9  31.8 - 35.4 g/dL   RDW, POC 14.5     Platelet Count, POC 229  142 - 424 K/uL   MPV 11.4  0 - 99.8 fL

## 2013-04-20 NOTE — Patient Instructions (Signed)
Drink plenty of fluids, clear liquids primarily, and get enough rest  Tylenol or ibuprofen for aching and pain  Zofran if needed for nausea  If coughing develops worse call back for a cough  Return as needed  Work on tobacco cessation as discussed

## 2013-06-06 ENCOUNTER — Emergency Department (HOSPITAL_COMMUNITY)
Admission: EM | Admit: 2013-06-06 | Discharge: 2013-06-06 | Disposition: A | Discharge status: Home / Self Care | Payer: BC Managed Care – PPO | Attending: Emergency Medicine | Admitting: Emergency Medicine

## 2013-06-06 ENCOUNTER — Emergency Department (HOSPITAL_COMMUNITY): Payer: BC Managed Care – PPO

## 2013-06-06 ENCOUNTER — Encounter (HOSPITAL_COMMUNITY): Payer: Self-pay | Admitting: Emergency Medicine

## 2013-06-06 DIAGNOSIS — Z79899 Other long term (current) drug therapy: Secondary | ICD-10-CM | POA: Insufficient documentation

## 2013-06-06 DIAGNOSIS — I10 Essential (primary) hypertension: Secondary | ICD-10-CM | POA: Insufficient documentation

## 2013-06-06 DIAGNOSIS — M25512 Pain in left shoulder: Secondary | ICD-10-CM

## 2013-06-06 DIAGNOSIS — F17200 Nicotine dependence, unspecified, uncomplicated: Secondary | ICD-10-CM | POA: Diagnosis not present

## 2013-06-06 DIAGNOSIS — Z8673 Personal history of transient ischemic attack (TIA), and cerebral infarction without residual deficits: Secondary | ICD-10-CM | POA: Diagnosis not present

## 2013-06-06 DIAGNOSIS — S59919A Unspecified injury of unspecified forearm, initial encounter: Secondary | ICD-10-CM

## 2013-06-06 DIAGNOSIS — S59909A Unspecified injury of unspecified elbow, initial encounter: Secondary | ICD-10-CM | POA: Diagnosis not present

## 2013-06-06 DIAGNOSIS — Z862 Personal history of diseases of the blood and blood-forming organs and certain disorders involving the immune mechanism: Secondary | ICD-10-CM | POA: Insufficient documentation

## 2013-06-06 DIAGNOSIS — S6990XA Unspecified injury of unspecified wrist, hand and finger(s), initial encounter: Secondary | ICD-10-CM | POA: Insufficient documentation

## 2013-06-06 DIAGNOSIS — S46909A Unspecified injury of unspecified muscle, fascia and tendon at shoulder and upper arm level, unspecified arm, initial encounter: Secondary | ICD-10-CM | POA: Diagnosis present

## 2013-06-06 DIAGNOSIS — E785 Hyperlipidemia, unspecified: Secondary | ICD-10-CM | POA: Insufficient documentation

## 2013-06-06 DIAGNOSIS — S4980XA Other specified injuries of shoulder and upper arm, unspecified arm, initial encounter: Secondary | ICD-10-CM | POA: Diagnosis present

## 2013-06-06 DIAGNOSIS — Z8739 Personal history of other diseases of the musculoskeletal system and connective tissue: Secondary | ICD-10-CM | POA: Insufficient documentation

## 2013-06-06 DIAGNOSIS — Y92410 Unspecified street and highway as the place of occurrence of the external cause: Secondary | ICD-10-CM | POA: Diagnosis not present

## 2013-06-06 DIAGNOSIS — M25522 Pain in left elbow: Secondary | ICD-10-CM

## 2013-06-06 DIAGNOSIS — Y9389 Activity, other specified: Secondary | ICD-10-CM | POA: Diagnosis not present
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
## 2013-06-06 NOTE — Discharge Instructions (Signed)
May take tylenol or motrin as needed for pain. Follow up with your primary care physician as needed. Return to the ED for new concerns.

## 2013-06-06 NOTE — ED Provider Notes (Signed)
Medical screening examination/treatment/procedure(s) were performed by non-physician practitioner and as supervising physician I was immediately available for consultation/collaboration.   EKG Interpretation None       Jasper Riling. Alvino Chapel, [place] 06/06/13 [phone]

## 2013-06-25 ENCOUNTER — Encounter: Payer: Self-pay | Admitting: Gastroenterology

## 2013-07-14 ENCOUNTER — Other Ambulatory Visit: Payer: Self-pay

## 2013-07-14 DIAGNOSIS — Z1231 Encounter for screening mammogram for malignant neoplasm of breast: Secondary | ICD-10-CM

## 2013-08-04 ENCOUNTER — Ambulatory Visit
Admission: RE | Admit: 2013-08-04 | Discharge: 2013-08-04 | Disposition: A | Discharge status: Home / Self Care | Payer: BC Managed Care – PPO | Source: Ambulatory Visit

## 2013-08-04 DIAGNOSIS — Z1231 Encounter for screening mammogram for malignant neoplasm of breast: Secondary | ICD-10-CM

## 2013-12-26 ENCOUNTER — Encounter (HOSPITAL_COMMUNITY): Payer: Self-pay | Admitting: Emergency Medicine

## 2014-06-08 ENCOUNTER — Encounter: Payer: Self-pay | Admitting: Gastroenterology

## 2014-06-21 ENCOUNTER — Ambulatory Visit (INDEPENDENT_AMBULATORY_CARE_PROVIDER_SITE_OTHER): Payer: BLUE CROSS/BLUE SHIELD | Admitting: Physician Assistant

## 2014-06-21 VITALS — BP 122/70 | HR 93 | Temp 98.3°F | Resp 18 | Ht 64.5 in | Wt 185.6 lb

## 2014-06-21 DIAGNOSIS — R6889 Other general symptoms and signs: Secondary | ICD-10-CM

## 2014-06-21 LAB — POCT INFLUENZA A/B
INFLUENZA A, POC: NEGATIVE
INFLUENZA B, POC: NEGATIVE

## 2014-06-21 MED ORDER — OSELTAMIVIR PHOSPHATE 75 MG PO CAPS
75.0000 mg | ORAL_CAPSULE | Freq: Two times a day (BID) | ORAL | Status: DC
Start: 2014-06-21 — End: 2019-05-12

## 2014-06-21 NOTE — Patient Instructions (Addendum)
We'll call you if your flu swab was positive.  I've sent tamiflu into the pharmacy, you can check to see the cost of this.  You look very congested behind your ears. Sudafed every 4-6 hours for congestion may help. Getting plenty of rest and drinking lots of fluids may help.  If allergies are playing a component, taking a daily antihistamine like claritin and doing daily flonase nasal spray is helpful.  I don't think you have any bacterial cause of your symptoms right now that would require an antibiotic.  Please let us know if you're not feeling better in 4-5 days.

## 2014-06-21 NOTE — Progress Notes (Signed)
   Subjective:    Patient ID: [name], female    DOB: [birth year], 54 y.o.   MRN: [id number]  Chief Complaint  Patient presents with  . Sore Throat    x 2 days  . Generalized Body Aches    x 2 days  . Headache    x 2 days   Prior to Admission medications   Not on File   Medications, allergies, past medical history, surgical history, family history, social history and problem list reviewed and updated.  HPI  54 yof presents with st, body aches, mild ha past couple days.   Sx started yest morning with mild st and no voice. Fatigue yeat and today. General body aches yest and today. Head/ear congested past couple days. Cough yest has improved today.   Denies abd pain, n/c, diarrhea.   No flu vaccine this year.   Review of Systems See HPI.     Objective:   Physical Exam  Constitutional:  BP 122/70 mmHg  Pulse 93  Temp(Src) 98.3 F (36.8 C) (Oral)  Resp 18  Ht 5' 4.5" (1.638 m)  Wt 185 lb 9.6 oz (84.188 kg)  BMI 31.38 kg/m2  SpO2 96%   HENT:  Right Ear: A middle ear effusion is present.  Left Ear: A middle ear effusion is present.  Nose: Mucosal edema and rhinorrhea present. Right sinus exhibits no maxillary sinus tenderness and no frontal sinus tenderness. Left sinus exhibits no maxillary sinus tenderness and no frontal sinus tenderness.  Mouth/Throat: Uvula is midline, oropharynx is clear and moist and mucous membranes are normal. No oropharyngeal exudate, posterior oropharyngeal edema, posterior oropharyngeal erythema or tonsillar abscesses.  Neck: No Brudzinski's sign noted.  Pulmonary/Chest: Effort normal and breath sounds normal. She has no decreased breath sounds. She has no wheezes. She has no rhonchi. She has no rales.  Lymphadenopathy:       Head (right side): Submandibular and tonsillar adenopathy present. No submental adenopathy present.       Head (left side): Submandibular and tonsillar adenopathy present. No submental adenopathy present.    She has  no cervical adenopathy.   Results for orders placed or performed in visit on 06/21/14  POCT Influenza A/B  Result Value Ref Range   Influenza A, POC Negative    Influenza B, POC Negative       Assessment & Plan:   54 yof presents with st, body aches, mild ha past couple days.   Flu-like symptoms - Plan: POCT Influenza A/B --flu swab neg --discussed tx options with pt and fact that tamiflu may shorten sx by one day, she would like to check with her pharmacy and if affordable proceed with tamiflu even with neg flu swab as she is in school and has several projects this week along with an event to plan --sudafed for congestion, claritin/flonase for allergies, rest, fluids --rtc if not better 4-5 days  Julieta Gutting, PA-C Physician Assistant-Certified [facility]  06/21/2014 11:08 AM

## 2014-07-13 ENCOUNTER — Other Ambulatory Visit: Payer: Self-pay

## 2014-07-13 DIAGNOSIS — Z1231 Encounter for screening mammogram for malignant neoplasm of breast: Secondary | ICD-10-CM

## 2014-08-02 ENCOUNTER — Encounter (INDEPENDENT_AMBULATORY_CARE_PROVIDER_SITE_OTHER): Payer: Self-pay

## 2014-08-02 ENCOUNTER — Ambulatory Visit
Admission: RE | Admit: 2014-08-02 | Discharge: 2014-08-02 | Disposition: A | Discharge status: Home / Self Care | Payer: BLUE CROSS/BLUE SHIELD | Source: Ambulatory Visit

## 2014-08-02 DIAGNOSIS — Z1231 Encounter for screening mammogram for malignant neoplasm of breast: Secondary | ICD-10-CM

## 2015-07-17 ENCOUNTER — Other Ambulatory Visit: Payer: Self-pay

## 2015-07-17 DIAGNOSIS — Z1231 Encounter for screening mammogram for malignant neoplasm of breast: Secondary | ICD-10-CM

## 2015-07-31 ENCOUNTER — Ambulatory Visit
Admission: RE | Admit: 2015-07-31 | Discharge: 2015-07-31 | Disposition: A | Discharge status: Home / Self Care | Payer: BLUE CROSS/BLUE SHIELD | Source: Ambulatory Visit

## 2015-07-31 DIAGNOSIS — Z1231 Encounter for screening mammogram for malignant neoplasm of breast: Secondary | ICD-10-CM

## 2016-07-16 ENCOUNTER — Other Ambulatory Visit: Payer: Self-pay | Admitting: Family Medicine

## 2016-07-16 DIAGNOSIS — Z1231 Encounter for screening mammogram for malignant neoplasm of breast: Secondary | ICD-10-CM

## 2016-08-06 ENCOUNTER — Ambulatory Visit
Admission: RE | Admit: 2016-08-06 | Discharge: 2016-08-06 | Disposition: A | Discharge status: Home / Self Care | Payer: BLUE CROSS/BLUE SHIELD | Source: Ambulatory Visit | Attending: Family Medicine | Admitting: Family Medicine

## 2016-08-06 DIAGNOSIS — Z1231 Encounter for screening mammogram for malignant neoplasm of breast: Secondary | ICD-10-CM

## 2017-11-23 ENCOUNTER — Encounter (HOSPITAL_COMMUNITY): Payer: Self-pay | Admitting: Emergency Medicine

## 2017-11-23 ENCOUNTER — Emergency Department (HOSPITAL_COMMUNITY)
Admission: EM | Admit: 2017-11-23 | Discharge: 2017-11-23 | Disposition: A | Discharge status: Home / Self Care | Payer: No Typology Code available for payment source | Attending: Emergency Medicine | Admitting: Emergency Medicine

## 2017-11-23 DIAGNOSIS — F17210 Nicotine dependence, cigarettes, uncomplicated: Secondary | ICD-10-CM | POA: Diagnosis not present

## 2017-11-23 DIAGNOSIS — R51 Headache: Secondary | ICD-10-CM | POA: Diagnosis not present

## 2017-11-23 DIAGNOSIS — S161XXA Strain of muscle, fascia and tendon at neck level, initial encounter: Secondary | ICD-10-CM | POA: Diagnosis not present

## 2017-11-23 DIAGNOSIS — D573 Sickle-cell trait: Secondary | ICD-10-CM | POA: Insufficient documentation

## 2017-11-23 DIAGNOSIS — S199XXA Unspecified injury of neck, initial encounter: Secondary | ICD-10-CM | POA: Diagnosis present

## 2017-11-23 DIAGNOSIS — Y998 Other external cause status: Secondary | ICD-10-CM | POA: Diagnosis not present

## 2017-11-23 DIAGNOSIS — Y939 Activity, unspecified: Secondary | ICD-10-CM | POA: Insufficient documentation

## 2017-11-23 DIAGNOSIS — I10 Essential (primary) hypertension: Secondary | ICD-10-CM | POA: Diagnosis not present

## 2017-11-23 DIAGNOSIS — R002 Palpitations: Secondary | ICD-10-CM

## 2017-11-23 DIAGNOSIS — Y92410 Unspecified street and highway as the place of occurrence of the external cause: Secondary | ICD-10-CM | POA: Insufficient documentation

## 2017-11-23 DIAGNOSIS — Z8673 Personal history of transient ischemic attack (TIA), and cerebral infarction without residual deficits: Secondary | ICD-10-CM | POA: Insufficient documentation

## 2017-11-23 DIAGNOSIS — E785 Hyperlipidemia, unspecified: Secondary | ICD-10-CM | POA: Diagnosis not present

## 2017-11-23 MED ORDER — IBUPROFEN 800 MG PO TABS: 800.0000 mg | ORAL_TABLET | Freq: Three times a day (TID) | ORAL | 0 refills | Status: DC

## 2017-11-23 MED ORDER — METHOCARBAMOL 500 MG PO TABS: 500.0000 mg | ORAL_TABLET | Freq: Two times a day (BID) | ORAL | 0 refills | Status: DC
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
## 2017-11-23 NOTE — Discharge Instructions (Addendum)
Return if any problems.  See your Physician for recheck in 1 week   °

## 2019-05-12 NOTE — Progress Notes (Signed)
SUBJECTIVE:   CHIEF COMPLAINT / HPI: Establish care  [name] is a 59 year old female presenting to establish care and discuss the following:  Has not seen a PCP in about 3 years. Her aunt is a patient of mine.   Reports mild dyspareunia with initial and complete insertion. Able to continue with activity. Recently became sexually active again after several years. Post-menopausal.   Past medical history:  Vertebral artery stenosis, 50-60% stenosis in 2012   Subarachnoid hemorrhage seen on MRI in 2012 without residual deficit  Tobacco use, 1/4-1/2 ppd for about 40 years, somewhat interested in cutting back   Hyperlipidemia: Last lipid panel in epic in 2014 showing LDL 189 with HDL 32  ?history of hypertension (occasionally elevated levels in the past), not on medications, denies any SOB, HA, dizziness, CP   Past surgical history: Hysterectomy in 1998 for fibroids, right ovary remaining, unsure if she has a cervix, believes she has a vaginal cuff only. However does report she has had some paps since her surgery.    Medications: None (previously had been on a statin medication, not currently taking)   Care team: Dr. 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  Does not drink alcohol, smokes cigarettes.  Health maintenance: Last colonoscopy in 2012 (states she was told around 10 years after this one), mammogram in 2018. Tdap in 2013.  OBJECTIVE:   BP (!) 180/71   Pulse 81   Ht 5' 4.5" (1.638 m)   Wt 189 lb 9.6 oz (86 kg)   SpO2 97%   BMI 32.04 kg/m   General: Alert, NAD HEENT: NCAT, MMM Cardiac: RRR, slight intermittent systolic murmur in the left 2nd intercostal space  Lungs: Clear bilaterally, no increased WOB  Abdomen: soft Msk: Moves all extremities spontaneously  Ext: Warm, dry, 2+ distal pulses, no edema   ASSESSMENT/PLAN:   Encounter to establish care with new  doctor Reviewed past medical, surgical, and social history.  Medications updated in epic. Health maintenance reviewed, will obtain HCV screening and instructed to follow up for mammogram. Encouraged working towards a well-balanced diet and staying physically active.  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 Will trial Replens daily lubrication, can consider vaginal estrogen in the future if needed.   Preventative health care --Unclear if cervix present. Will perform pelvic on follow up to assess, if so will do pap w/ HPV testing.  --Obtain screening A1c due to elevated BMI  HYPERLIPIDEMIA Obtain lipid panel. Should be on statin regardless due to history of CVA.   Elevated blood-pressure reading without diagnosis of hypertension SBP 180-200 during visit, asymptomatic. Smoked cigarette prior to coming into office and anxious for visit. Not on meds. Will get BP cuff at home and measure daily with completing log to bring in. Will obtain BMP today to assess renal function w/ unclear BP control in the past several years. Follow up in ~2 weeks or sooner if consistently >140/90.   Tobacco use Approximate 20 pack year history, would qualify for low dose CT lung cancer screening, pt would like to postpone for in the next several months. Discussed cessation, she is going to start decreasing slowly. Provided encouragement, let her know we have additional resources if needed.     F/u in 2-3 weeks for above or sooner if needed.   Patriciaann Clan, [place]

## 2019-05-13 ENCOUNTER — Other Ambulatory Visit: Payer: Self-pay

## 2019-05-13 ENCOUNTER — Ambulatory Visit (INDEPENDENT_AMBULATORY_CARE_PROVIDER_SITE_OTHER): Payer: 59 | Admitting: Family Medicine

## 2019-05-13 ENCOUNTER — Encounter: Payer: Self-pay | Admitting: Family Medicine

## 2019-05-13 VITALS — BP 180/71 | HR 81 | Ht 64.5 in | Wt 189.6 lb

## 2019-05-13 DIAGNOSIS — R030 Elevated blood-pressure reading, without diagnosis of hypertension: Secondary | ICD-10-CM | POA: Diagnosis not present

## 2019-05-13 DIAGNOSIS — I609 Nontraumatic subarachnoid hemorrhage, unspecified: Secondary | ICD-10-CM

## 2019-05-13 DIAGNOSIS — Z720 Tobacco use: Secondary | ICD-10-CM

## 2019-05-13 DIAGNOSIS — F17200 Nicotine dependence, unspecified, uncomplicated: Secondary | ICD-10-CM

## 2019-05-13 DIAGNOSIS — N941 Unspecified dyspareunia: Secondary | ICD-10-CM | POA: Diagnosis not present

## 2019-05-13 DIAGNOSIS — Z0000 Encounter for general adult medical examination without abnormal findings: Secondary | ICD-10-CM | POA: Diagnosis not present

## 2019-05-13 DIAGNOSIS — Z7689 Persons encountering health services in other specified circumstances: Secondary | ICD-10-CM

## 2019-05-13 LAB — POCT GLYCOSYLATED HEMOGLOBIN (HGB A1C): Hemoglobin A1C: 6.1 % — AB (ref 4.0–5.6)

## 2019-05-13 NOTE — Patient Instructions (Signed)
So excited you established care today! We are going to check some labs and I will let you know the results next week. Please try to walk when you can and backing off of salty foods.   Please get a blood cuff to take your blood pressure daily. Keep a log of this.   I would like you to follow up in the 2-3 weeks with your records, we will do a pelvic at this time.

## 2019-05-13 NOTE — Assessment & Plan Note (Addendum)
Reviewed past medical, surgical, and social history.  Medications updated in epic. Health maintenance reviewed, will obtain HCV screening and instructed to follow up for mammogram. Encouraged working towards a well-balanced diet and staying physically active.

## 2019-05-15 LAB — SPECIMEN STATUS

## 2019-05-16 ENCOUNTER — Encounter: Payer: Self-pay | Admitting: Family Medicine

## 2019-05-16 DIAGNOSIS — N941 Unspecified dyspareunia: Secondary | ICD-10-CM | POA: Insufficient documentation

## 2019-05-16 DIAGNOSIS — Z87891 Personal history of nicotine dependence: Secondary | ICD-10-CM | POA: Insufficient documentation

## 2019-05-16 DIAGNOSIS — R030 Elevated blood-pressure reading, without diagnosis of hypertension: Secondary | ICD-10-CM | POA: Insufficient documentation

## 2019-05-16 DIAGNOSIS — Z720 Tobacco use: Secondary | ICD-10-CM | POA: Insufficient documentation

## 2019-05-16 HISTORY — DX: Elevated blood-pressure reading, without diagnosis of hypertension: R03.0

## 2019-05-16 LAB — BASIC METABOLIC PANEL
BUN/Creatinine Ratio: 12 (ref 9–23)
BUN: 10 mg/dL (ref 6–24)
CO2: 27 mmol/L (ref 20–29)
Calcium: 10.3 mg/dL — ABNORMAL HIGH (ref 8.7–10.2)
Chloride: 105 mmol/L (ref 96–106)
Creatinine, Ser: 0.84 mg/dL (ref 0.57–1.00)
GFR calc Af Amer: 88 mL/min/{1.73_m2} (ref >59)
GFR calc non Af Amer: 76 mL/min/{1.73_m2} (ref >59)
Glucose: 81 mg/dL (ref 65–99)
Potassium: 4.3 mmol/L (ref 3.5–5.2)
Sodium: 143 mmol/L (ref 134–144)

## 2019-05-16 LAB — LIPID PANEL
Chol/HDL Ratio: 8.1 ratio — ABNORMAL HIGH (ref 0.0–4.4)
Cholesterol, Total: 308 mg/dL — ABNORMAL HIGH (ref 100–199)
HDL: 38 mg/dL — ABNORMAL LOW (ref >39)
LDL Chol Calc (NIH): 233 mg/dL — ABNORMAL HIGH (ref 0–99)
Triglycerides: 189 mg/dL — ABNORMAL HIGH (ref 0–149)
VLDL Cholesterol Cal: 37 mg/dL (ref 5–40)

## 2019-05-16 LAB — HCV RNA BY PCR, QN RFX GENO: HCV Quant Baseline: NOT DETECTED IU/mL

## 2019-05-16 NOTE — Assessment & Plan Note (Signed)
Suspect may be due to post-menopausal vaginal atrophy. Will trial Replens daily lubrication, can consider vaginal estrogen in the future if needed.

## 2019-05-16 NOTE — Assessment & Plan Note (Addendum)
--  Unclear if cervix present. Will perform pelvic on follow up to assess, if so will do pap w/ HPV testing.  --Obtain screening A1c due to elevated BMI

## 2019-05-16 NOTE — Assessment & Plan Note (Addendum)
Obtain lipid panel. Should be on statin regardless due to history of CVA.

## 2019-05-16 NOTE — Assessment & Plan Note (Addendum)
SBP 180-200 during visit, asymptomatic. Smoked cigarette prior to coming into office and anxious for visit. Not on meds. Will get BP cuff at home and measure daily with completing log to bring in. Will obtain BMP today to assess renal function w/ unclear BP control in the past several years. Follow up in ~2 weeks or sooner if consistently >140/90.

## 2019-05-16 NOTE — Assessment & Plan Note (Signed)
Approximate 20 pack year history, would qualify for low dose CT lung cancer screening, pt would like to postpone for in the next several months. Discussed cessation, she is going to start decreasing slowly. Provided encouragement, let her know we have additional resources if needed.

## 2019-05-18 ENCOUNTER — Other Ambulatory Visit: Payer: Self-pay | Admitting: Family Medicine

## 2019-05-18 DIAGNOSIS — E785 Hyperlipidemia, unspecified: Secondary | ICD-10-CM

## 2019-05-18 MED ORDER — ROSUVASTATIN CALCIUM 5 MG PO TABS: 5.0000 mg | ORAL_TABLET | Freq: Every day | ORAL | 0 refills | Status: DC

## 2019-05-18 NOTE — Progress Notes (Signed)
Ordered Crestor due to significant mixed hyperlipidemia and history of CVA.  See recent result note.  Will start at lowest dose and titrate up due to previous atypical side effects via chart review.  Has rash listed as side effect to simvastatin, will monitor with patient aware of this.  Patriciaann Clan, DO

## 2019-05-23 ENCOUNTER — Other Ambulatory Visit: Payer: Self-pay | Admitting: Family Medicine

## 2019-05-23 DIAGNOSIS — Z1231 Encounter for screening mammogram for malignant neoplasm of breast: Secondary | ICD-10-CM

## 2019-06-06 ENCOUNTER — Other Ambulatory Visit: Payer: Self-pay

## 2019-06-06 ENCOUNTER — Ambulatory Visit
Admission: RE | Admit: 2019-06-06 | Discharge: 2019-06-06 | Disposition: A | Discharge status: Home / Self Care | Payer: 59 | Source: Ambulatory Visit | Attending: Family Medicine | Admitting: Family Medicine

## 2019-06-06 ENCOUNTER — Ambulatory Visit (INDEPENDENT_AMBULATORY_CARE_PROVIDER_SITE_OTHER): Payer: 59 | Admitting: Family Medicine

## 2019-06-06 DIAGNOSIS — R030 Elevated blood-pressure reading, without diagnosis of hypertension: Secondary | ICD-10-CM

## 2019-06-06 DIAGNOSIS — Z0000 Encounter for general adult medical examination without abnormal findings: Secondary | ICD-10-CM

## 2019-06-06 DIAGNOSIS — Z720 Tobacco use: Secondary | ICD-10-CM | POA: Diagnosis not present

## 2019-06-06 DIAGNOSIS — Z1231 Encounter for screening mammogram for malignant neoplasm of breast: Secondary | ICD-10-CM

## 2019-06-06 NOTE — Progress Notes (Signed)
    SUBJECTIVE:   CHIEF COMPLAINT / HPI: Follow-up tobacco use/BP  [name] is a 59 year old female presenting discussed the following:  Elevated blood pressure: Noted to have significantly elevated BP, systolics ranging [id number] during last visit on 3/19.  Asymptomatic at that time.  She was instructed to get a blood pressure cuff and measure at home.  She has been checking her blood pressure when she gets to work every morning, states her systolic has not been over 0000000 and her diastolic has not been over 81.   Tobacco use: Down to 7 cigarettes daily on a consistent basis.  Has been working hard setting specific times that she will stop work for a cigarette and taking a limited amount of cigarettes with her at a time.   Hyperlipidemia: LDL 233 and has a history of hemorrhagic stroke, sent in prescription to start Crestor in 04/2019.  Today she reports that her pharmacy told her they did not have a prescription when she went to pick it up.  PERTINENT  PMH / PSH: History of subarachnoid hemorrhage, tobacco use, hyperlipidemia, elevated blood pressure, vertebral artery stenosis, previous hysterectomy  OBJECTIVE:   BP 126/80   Pulse 88   Ht 5' 4.5" (1.638 m)   Wt 189 lb 9.6 oz (86 kg)   SpO2 99%   BMI 32.04 kg/m   General: Alert, NAD HEENT: NCAT, MMM, oropharynx nonerythematous  Cardiac: RRR  Lungs: Clear bilaterally, no increased WOB  Abdomen: soft Msk: Moves all extremities spontaneously  Ext: Warm, dry, no edema    ASSESSMENT/PLAN:   HYPERLIPIDEMIA Secondary prevention, history of hemorrhagic CVA.  Sent in [place] in 04/2019, however patient had not started after going to wrong pharmacy.  Informed of Crestor location, will pick up and start tomorrow.  LDL 233 with goal <70, strongly suspect she will need a 2nd agent in the future.  Elevated blood-pressure reading without diagnosis of hypertension Appears to be resolved, brief hypertensive episode recently likely in the setting  of cigarette use/stress while establishing new medical care.  Normotensive throughout multiple ambulatory checks, 126/80 in the office today.  Will have her intermittently monitor BP at home, if remains persistently normal by follow-up will discontinue.   Tobacco use Down to 7 cigarettes consistently on a daily basis, congratulated patient on her efforts thus far.  Will continue to monitor, patient not interested in any additional cessation therapy at this time.  Preventative health care --Last colonoscopy within system in 2012, recommended 3-year follow-up at that time.  Patient reports she thinks she had a colonoscopy after this time, however is unsure.  Will obtain records from [place], however should show up in epic if completed.  Will likely refer back to GI for screening colonoscopy as I suspect she is overdue.   --We will also plan on pelvic exam on follow-up, unclear if cervix is present with history of hysterectomy.  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  Not currently on any medications or precipitate hypercalcemia, low concern for malignancy. Will repeat CMP today with PTH level.     Follow-up in approximately 2 months or sooner if needed  [name], [place]

## 2019-06-06 NOTE — Patient Instructions (Addendum)
Wonderful to see you today as always.  Keep checking your blood pressure intermittently at work and keeping a log of this.  Also make sure that you pick up the Crestor at [facility].   I am going to recheck your blood today due to having a slightly high calcium, I will let you know these results in the next several days.  I would like to see you back in about 2 months or sooner if needed especially if you notice your blood pressure is up, we will plan on doing a pelvic exam at that time.

## 2019-06-07 LAB — COMPREHENSIVE METABOLIC PANEL
ALT: 9 IU/L (ref 0–32)
AST: 12 IU/L (ref 0–40)
Albumin/Globulin Ratio: 1.5 (ref 1.2–2.2)
Albumin: 4.1 g/dL (ref 3.8–4.9)
Alkaline Phosphatase: 95 IU/L (ref 39–117)
BUN/Creatinine Ratio: 13 (ref 12–28)
BUN: 10 mg/dL (ref 8–27)
Bilirubin Total: 0.3 mg/dL (ref 0.0–1.2)
CO2: 23 mmol/L (ref 20–29)
Calcium: 9.9 mg/dL (ref 8.7–10.3)
Chloride: 103 mmol/L (ref 96–106)
Creatinine, Ser: 0.8 mg/dL (ref 0.57–1.00)
GFR calc Af Amer: 93 mL/min/{1.73_m2} (ref >59)
GFR calc non Af Amer: 80 mL/min/{1.73_m2} (ref >59)
Globulin, Total: 2.8 g/dL (ref 1.5–4.5)
Glucose: 82 mg/dL (ref 65–99)
Potassium: 3.7 mmol/L (ref 3.5–5.2)
Sodium: 142 mmol/L (ref 134–144)
Total Protein: 6.9 g/dL (ref 6.0–8.5)

## 2019-06-09 ENCOUNTER — Encounter: Payer: Self-pay | Admitting: Family Medicine

## 2019-06-09 HISTORY — DX: Hypercalcemia: E83.52

## 2019-06-09 LAB — INTACT PTH (INCLUDES CALCIUM)
Calcium, Serum: 9.7 mg/dL
PTH (Intact Assay): 39 pg/mL

## 2019-06-09 NOTE — Assessment & Plan Note (Addendum)
Appears to be resolved, brief hypertensive episode recently likely in the setting of cigarette use/stress while establishing new medical care.  Normotensive throughout multiple ambulatory checks, 126/80 in the office today.  Will have her intermittently monitor BP at home, if remains persistently normal by follow-up will discontinue.

## 2019-06-09 NOTE — Assessment & Plan Note (Addendum)
--  Last colonoscopy within system in 2012, recommended 3-year follow-up at that time.  Patient reports she thinks she had a colonoscopy after this time, however is unsure.  Will obtain records from [place], however should show up in epic if completed.  Will likely refer back to GI for screening colonoscopy as I suspect she is overdue.   --We will also plan on pelvic exam on follow-up, unclear if cervix is present with history of hysterectomy.  --Qualifies for low-dose CT lung screening, patient would like to postpone for now, will bring up on subsequent visits

## 2019-06-09 NOTE — Assessment & Plan Note (Signed)
Down to 7 cigarettes consistently on a daily basis, congratulated patient on her efforts thus far.  Will continue to monitor, patient not interested in any additional cessation therapy at this time.

## 2019-06-09 NOTE — Assessment & Plan Note (Signed)
Ca 10.3 in 04/2019.  Not currently on any medications or precipitate hypercalcemia, low concern for malignancy. Will repeat CMP today with PTH level.

## 2019-06-09 NOTE — Assessment & Plan Note (Addendum)
Secondary prevention, history of hemorrhagic CVA.  Sent in [place] in 04/2019, however patient had not started after going to wrong pharmacy.  Informed of Crestor location, will pick up and start tomorrow.  LDL 233 with goal <70, strongly suspect she will need a 2nd agent in the future.

## 2019-07-14 ENCOUNTER — Other Ambulatory Visit: Payer: Self-pay | Admitting: Family Medicine

## 2019-07-14 DIAGNOSIS — I609 Nontraumatic subarachnoid hemorrhage, unspecified: Secondary | ICD-10-CM

## 2019-07-14 DIAGNOSIS — E785 Hyperlipidemia, unspecified: Secondary | ICD-10-CM

## 2019-07-14 MED ORDER — ROSUVASTATIN CALCIUM 10 MG PO TABS: 10.0000 mg | ORAL_TABLET | Freq: Every day | ORAL | 1 refills | Status: DC

## 2020-04-06 ENCOUNTER — Ambulatory Visit (INDEPENDENT_AMBULATORY_CARE_PROVIDER_SITE_OTHER): Payer: 59 | Admitting: Family Medicine

## 2020-04-06 ENCOUNTER — Other Ambulatory Visit: Payer: Self-pay

## 2020-04-06 ENCOUNTER — Encounter: Payer: Self-pay | Admitting: Family Medicine

## 2020-04-06 VITALS — BP 176/88 | HR 87 | Wt 182.6 lb

## 2020-04-06 DIAGNOSIS — R011 Cardiac murmur, unspecified: Secondary | ICD-10-CM

## 2020-04-06 DIAGNOSIS — Z0000 Encounter for general adult medical examination without abnormal findings: Secondary | ICD-10-CM

## 2020-04-06 DIAGNOSIS — Z720 Tobacco use: Secondary | ICD-10-CM

## 2020-04-06 DIAGNOSIS — R030 Elevated blood-pressure reading, without diagnosis of hypertension: Secondary | ICD-10-CM

## 2020-04-06 DIAGNOSIS — I609 Nontraumatic subarachnoid hemorrhage, unspecified: Secondary | ICD-10-CM

## 2020-04-06 DIAGNOSIS — F4321 Adjustment disorder with depressed mood: Secondary | ICD-10-CM

## 2020-04-06 DIAGNOSIS — Z7689 Persons encountering health services in other specified circumstances: Secondary | ICD-10-CM

## 2020-04-06 MED ORDER — ROSUVASTATIN CALCIUM 10 MG PO TABS: 10.0000 mg | ORAL_TABLET | Freq: Every day | ORAL | 1 refills | Status: DC

## 2020-04-06 NOTE — Progress Notes (Signed)
SUBJECTIVE:   CHIEF COMPLAINT / HPI: Follow up/check in   [name] is a 60 year old female presenting for general follow-up.  She has not been seen since 05/2019.  Grief reaction: Her aunt, who was also a close patient of mine, passed away at the end of 05-28-19.  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  She continues to grieve her loss, she was very close to her aunt.  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  She fortunately is doing some independent work as well but is sad to leave this job.  Confides in her family and "vents" often.  Hyperlipidemia: Previous prescribed Crestor for secondary prevention.  She tolerated this medication well however never got around to filling it and has not taken for several months.  Recent COVID-19: Had illness at the end of 01/2020.  Did not require hospitalization, after she reports continued fatigue and general dyspnea on exertion.  This has improved since her illness.  Elevated blood pressure: Previously elevated intermittently in early 28-May-2019, however WNL on several visits as well.  Not on any medications.  She has a BP cuff at home and last checked in December and states systolic was always around 120-130.  Still smoking, smoked a cigarette prior to this appointment.  40 years smoking, 1/2-1 PPD during this time.  [place] Office Visit from 04/06/2020 in [place]  Thoughts that you would be better off dead, or of hurting yourself in some way Not at all  PHQ-9 Total Score 2      PERTINENT  PMH / PSH: Previous subarachnoid hemorrhage in 28-May-2010, tobacco use, hyperlipidemia, elevated blood pressure without history of hypertension  OBJECTIVE:   BP (!) 176/88   Pulse 87   Wt 182 lb 9.6 oz (82.8 kg)   SpO2 98%   BMI 30.86 kg/m   General: Alert, NAD HEENT: NCAT, MMM Cardiac: RRR, Grade 2-3  systolic murmur best heard in left intercostal space  Lungs: Clear bilaterally, no increased WOB, no wheezing or rhonchi   Msk: Moves all extremities spontaneously  Ext: Warm, dry, 2+ distal pulses   ASSESSMENT/PLAN:   Elevated blood-pressure reading without diagnosis of hypertension SBP 170-180 during visit and repeated twice, smoked a cigarette prior to appointment and with increased dressers/anxiety currently.  She had a similar situation May 28, 2019 when establishing care with several ambulatory checks afterwards with systolic 035D.  Asymptomatic.  Check BMP.  BP cuff at home, keep journal over the weekend and call on Monday with goal <130/80.  If persistently elevated, will start antihypertensive especially with history of subarachnoid hemorrhage.  HYPERLIPIDEMIA Has not been on statin since 2019-05-28.  Rx'd Crestor to restart.  Check lipid panel in 3 months.  Systolic murmur Unclear chronicity with unclear etiology, did note intermittent grade 2/6 systolic murmur in 10/7414 however sounds more intense on today's evaluation.  Check CBC and TSH.  Eval with echocardiogram (to also assess for any hypertrophic changes or sequelae from COVID-19).   Grief reaction Appropriate after the loss of her aunt.  Provided supportive listening and comfort.  Given therapy resources if she desires to establish care in the future.  Preventative health care Referral to GI placed for screening colonoscopy, last in 05/28/10.  Will also place order for CT low-dose lung cancer screening.    Follow-up in approximately 1-2 weeks  or sooner if needed.  ED precautions discussed especially if BP consistently > 200/110 with any lightheadedness/dizziness, vision changes, chest pain, or shortness of breath.  Patriciaann Clan, [place]

## 2020-04-06 NOTE — Patient Instructions (Addendum)
Checking labs today-- I'll call next week with results.   Check BP at home every day while resting for atleast 5 min and no cigarette before: Goal Around 130/80   Should hear from GI in about 2-3 weeks if not please call us.   I will look into CT lung screening.    Therapy and Counseling Resources Most providers on this list will take Medicaid. Patients with commercial insurance or Medicare should contact their insurance company to get a list of in network providers.  [facility]:Psychiatry and Counseling [address] [facility] [address]. [place], [place] [address] [place]  [address] [place], [place] [address]      [place] [address]  [place], [place] [address] [phone]  [place] [address] [place]  [place], [place] [address]       [phone]      Jinny Blossom [facility] [address] [place], [place]  Family Solutions:  [place]. [place] [phone]  [facility] Counseling:  [place] [address]ate [phone]  [facility] (under & uninsured) [address], [place] [phone]    [email or web address]    [place] [address] . [place]    [phone]  [facility] [place]     Phone:  [phone]     [place]  [place] [address]      [place], [place] ext [place]: [place], [place], [place]   [place] ([place] therapist) [email or web address]  [place] [address]   [place] [address]    [phone]    [facility]   [address],  [place], [place]   [place]   [place]  [facility]  [address] [place], [place]        [phone]  Open Access/Walk In Clinic under & uninsured  [facility]  [address] [place], [place] Crisis [phone]  [facility] of the [place],  ([place])   [place], [place]: [phone]) 8:30 - 12; 1 - 2:30  [facility] of the [place],  [place], [place]    [phone]):8:30 - 12; 2 - 3PM  [facility] [employer],  [address]  [place]; [phone]):   Mon - Fri 8 AM - 5 PM  [facility] [place]  MWF 12:30 to 3:00 or call to schedule an appointment  [phone]  Specific Provider options Psychology Today  [email or web address] 1. click on find a therapist  2. enter your zip code 3. left side and select or tailor a therapist for your specific need.   [facility] Provider Directory [email or web address]  (Medicaid)   Follow all drop down to find a provider  [place] [phone] or [email or web address] [address], [place], [place] Recovery support and educational   24- Hour Availability:  .  [place]  . [place], [place] Crisis [phone]  . [facility] the [place]  Crisis Line [phone]  [place] Service  [phone]   . [place]  [phone] (after hours)  . Therapeutic Alternative/Mobile Crisis   [phone]  . [place] National Suicide Hotline  [phone] ([place])  . Call 911 or go to emergency room  . [employer]  [phone]);  [place] and [employer]   . [facility] ACCESS  [phone]); [place], [place], [place], [place], [place], [place], [place]

## 2020-04-07 ENCOUNTER — Encounter: Payer: Self-pay | Admitting: Family Medicine

## 2020-04-07 DIAGNOSIS — F4321 Adjustment disorder with depressed mood: Secondary | ICD-10-CM | POA: Insufficient documentation

## 2020-04-07 DIAGNOSIS — R011 Cardiac murmur, unspecified: Secondary | ICD-10-CM | POA: Insufficient documentation

## 2020-04-07 DIAGNOSIS — F4320 Adjustment disorder, unspecified: Secondary | ICD-10-CM | POA: Insufficient documentation

## 2020-04-07 LAB — BASIC METABOLIC PANEL
BUN/Creatinine Ratio: 10 — ABNORMAL LOW (ref 12–28)
BUN: 6 mg/dL — ABNORMAL LOW (ref 8–27)
CO2: 22 mmol/L (ref 20–29)
Calcium: 9.4 mg/dL (ref 8.7–10.3)
Chloride: 104 mmol/L (ref 96–106)
Creatinine, Ser: 0.6 mg/dL (ref 0.57–1.00)
GFR calc Af Amer: 115 mL/min/{1.73_m2} (ref >59)
GFR calc non Af Amer: 99 mL/min/{1.73_m2} (ref >59)
Glucose: 139 mg/dL — ABNORMAL HIGH (ref 65–99)
Potassium: 3.4 mmol/L — ABNORMAL LOW (ref 3.5–5.2)
Sodium: 141 mmol/L (ref 134–144)

## 2020-04-07 LAB — CBC WITH DIFFERENTIAL/PLATELET
Basophils Absolute: 0.1 10*3/uL (ref 0.0–0.2)
Basos: 1 %
EOS (ABSOLUTE): 0.1 10*3/uL (ref 0.0–0.4)
Eos: 1 %
Hematocrit: 41 % (ref 34.0–46.6)
Hemoglobin: 13.4 g/dL (ref 11.1–15.9)
Immature Grans (Abs): 0 10*3/uL (ref 0.0–0.1)
Immature Granulocytes: 0 %
Lymphocytes Absolute: 2.8 10*3/uL (ref 0.7–3.1)
Lymphs: 37 %
MCH: 27.3 pg (ref 26.6–33.0)
MCHC: 32.7 g/dL (ref 31.5–35.7)
MCV: 84 fL (ref 79–97)
Monocytes Absolute: 0.5 10*3/uL (ref 0.1–0.9)
Monocytes: 6 %
Neutrophils Absolute: 4 10*3/uL (ref 1.4–7.0)
Neutrophils: 55 %
Platelets: 379 10*3/uL (ref 150–450)
RBC: 4.9 x10E6/uL (ref 3.77–5.28)
RDW: 13.6 % (ref 11.7–15.4)
WBC: 7.4 10*3/uL (ref 3.4–10.8)

## 2020-04-07 LAB — TSH: TSH: 0.692 u[IU]/mL (ref 0.450–4.500)

## 2020-04-07 NOTE — Assessment & Plan Note (Signed)
Referral to GI placed for screening colonoscopy, last in 2012.  Will also place order for CT low-dose lung cancer screening.

## 2020-04-07 NOTE — Assessment & Plan Note (Addendum)
SBP 170-180 during visit and repeated twice, smoked a cigarette prior to appointment and with increased dressers/anxiety currently.  She had a similar situation 04/2019 when establishing care with several ambulatory checks afterwards with systolic 491P.  Asymptomatic.  Check BMP.  BP cuff at home, keep journal over the weekend and call on Monday with goal <130/80.  If persistently elevated, will start antihypertensive especially with history of subarachnoid hemorrhage.

## 2020-04-07 NOTE — Assessment & Plan Note (Signed)
Has not been on statin since 2021.  Rx'd Crestor to restart.  Check lipid panel in 3 months.

## 2020-04-07 NOTE — Assessment & Plan Note (Signed)
Unclear chronicity with unclear etiology, did note intermittent grade 2/6 systolic murmur in 08/9394 however sounds more intense on today's evaluation.  Check CBC and TSH.  Eval with echocardiogram (to also assess for any hypertrophic changes or sequelae from COVID-19).

## 2020-04-07 NOTE — Assessment & Plan Note (Signed)
Appropriate after the loss of her aunt.  Provided supportive listening and comfort.  Given therapy resources if she desires to establish care in the future.

## 2020-04-09 ENCOUNTER — Other Ambulatory Visit: Payer: Self-pay | Admitting: Family Medicine

## 2020-04-09 DIAGNOSIS — R011 Cardiac murmur, unspecified: Secondary | ICD-10-CM

## 2020-04-10 ENCOUNTER — Telehealth: Payer: Self-pay

## 2020-04-10 NOTE — Telephone Encounter (Signed)
Called and informed patient of appointment.  CT Low dose scan 04/25/2020 1500 time 1445 arrival  [place], [place] [address] [phone]  Patient verbalized understanding.   Ozella Almond, [place]

## 2020-04-13 ENCOUNTER — Encounter: Payer: Self-pay | Admitting: Family Medicine

## 2020-04-13 ENCOUNTER — Ambulatory Visit (INDEPENDENT_AMBULATORY_CARE_PROVIDER_SITE_OTHER): Payer: 59 | Admitting: Family Medicine

## 2020-04-13 ENCOUNTER — Other Ambulatory Visit: Payer: Self-pay

## 2020-04-13 VITALS — BP 160/82 | HR 92 | Ht 64.0 in | Wt 180.8 lb

## 2020-04-13 DIAGNOSIS — R030 Elevated blood-pressure reading, without diagnosis of hypertension: Secondary | ICD-10-CM | POA: Diagnosis not present

## 2020-04-13 DIAGNOSIS — Z720 Tobacco use: Secondary | ICD-10-CM | POA: Diagnosis not present

## 2020-04-13 DIAGNOSIS — I10 Essential (primary) hypertension: Secondary | ICD-10-CM

## 2020-04-13 MED ORDER — CHLORTHALIDONE 25 MG PO TABS: 25.0000 mg | ORAL_TABLET | Freq: Every day | ORAL | 0 refills | Status: DC

## 2020-04-13 NOTE — Progress Notes (Signed)
    SUBJECTIVE:   CHIEF COMPLAINT / HPI: BP discussion   [name] is a 60 yo female presenting for evaluation of elevated blood pressure.   She was recently seen on 2/11 for follow up and noted to have elevated BP at that time. Through chart review, appears she had a distant history of hypertension but has not been on medications in several years. Recommended monitoring BP at home.   She reports SBP has been ranging around 150 on average. She has been stressed recently with changes in job and currently in school at well with papers due. Denies any SOB, chest pain, lightheadedness/dizziness, blurred vision. Somewhat activity walking at her work. Mainly drinking water recently. Reports "I know I need to work on my diet."   PERTINENT  PMH / [place]: Previous subarachnoid hemorrhage in 2012, tobacco use, hyperlipidemia, elevated blood pressure   OBJECTIVE:   BP (!) 160/82   Pulse 92   Ht 5\' 4"  (1.626 m)   Wt 180 lb 12.8 oz (82 kg)   SpO2 99%   BMI 31.03 kg/m   General: Alert, NAD HEENT: NCAT, MMM Cardiac: RRR 2/6 systolic murmur  Lungs: Clear bilaterally, no increased WOB  Msk: Moves all extremities spontaneously  Ext: Warm, dry, 2+ distal pulses, no edema   ASSESSMENT/PLAN:   Hypertension Poorly controlled and confirmed with ambulatory checks. Goal <130/80. Reports tolerating HCTZ well in the past, will start chlorthalidone 25 mg. Check BMP on f/u with A1c.  Discussed concurrently working on lifestyle changes including well-balanced diet, increase physical activity, and quitting smoking.  Tobacco use Contemplation phase.  As she continues to consider quitting, she is aware to reach out for resources as needed.    Follow-up scheduled for 3/2 during visit, sooner if needed.  ED precautions discussed.  Patriciaann Clan, [place]

## 2020-04-13 NOTE — Assessment & Plan Note (Signed)
Contemplation phase.  As she continues to consider quitting, she is aware to reach out for resources as needed.

## 2020-04-13 NOTE — Patient Instructions (Signed)
We are going to start a new BP medicine. You can take this in the morning or night.   Continue to thinking about quitting smoking. Increase your activity and work on balanced meals like we discussed.   We will schedule your echo soon, call with timing.

## 2020-04-13 NOTE — Assessment & Plan Note (Addendum)
Poorly controlled and confirmed with ambulatory checks. Goal <130/80. Reports tolerating HCTZ well in the past, will start chlorthalidone 25 mg. Check BMP on f/u with A1c.  Discussed concurrently working on lifestyle changes including well-balanced diet, increase physical activity, and quitting smoking.

## 2020-04-16 ENCOUNTER — Telehealth: Payer: Self-pay | Admitting: *Deleted

## 2020-04-16 ENCOUNTER — Telehealth: Payer: Self-pay

## 2020-04-16 NOTE — Telephone Encounter (Signed)
-----   Message from Brittany Clan, DO sent at 04/13/2020  9:36 PM EST ----- Can we please get her scheduled for her echocardiogram? Thank you!

## 2020-04-16 NOTE — Telephone Encounter (Signed)
Scheduled pts appt but was unable to leave a voicemail. Will try again later. Sharene Krikorian Kennon Holter, CMA

## 2020-04-16 NOTE — Telephone Encounter (Signed)
Called patient and informed her of upcoming appointment.  Echocardiogram [facility] Echo lab 05/04/2020 0900 Arrival 0845  Patient verbalized understanding.  Ozella Almond, [place]

## 2020-04-16 NOTE — Telephone Encounter (Signed)
Opened in error and documented elsewhere.  [name], [place]

## 2020-04-25 ENCOUNTER — Ambulatory Visit
Admission: RE | Admit: 2020-04-25 | Discharge: 2020-04-25 | Disposition: A | Discharge status: Home / Self Care | Payer: 59 | Source: Ambulatory Visit | Attending: Family Medicine | Admitting: Family Medicine

## 2020-04-25 ENCOUNTER — Ambulatory Visit: Payer: 59 | Admitting: Family Medicine

## 2020-04-25 ENCOUNTER — Other Ambulatory Visit: Payer: Self-pay

## 2020-04-25 DIAGNOSIS — Z720 Tobacco use: Secondary | ICD-10-CM

## 2020-05-04 ENCOUNTER — Ambulatory Visit (HOSPITAL_COMMUNITY)
Admission: RE | Admit: 2020-05-04 | Discharge: 2020-05-04 | Disposition: A | Discharge status: Home / Self Care | Payer: 59 | Source: Ambulatory Visit | Attending: Family Medicine | Admitting: Family Medicine

## 2020-05-04 ENCOUNTER — Other Ambulatory Visit: Payer: Self-pay

## 2020-05-04 DIAGNOSIS — I639 Cerebral infarction, unspecified: Secondary | ICD-10-CM | POA: Insufficient documentation

## 2020-05-04 DIAGNOSIS — I119 Hypertensive heart disease without heart failure: Secondary | ICD-10-CM | POA: Insufficient documentation

## 2020-05-04 DIAGNOSIS — E785 Hyperlipidemia, unspecified: Secondary | ICD-10-CM | POA: Insufficient documentation

## 2020-05-04 DIAGNOSIS — M25511 Pain in right shoulder: Secondary | ICD-10-CM | POA: Insufficient documentation

## 2020-05-04 DIAGNOSIS — I340 Nonrheumatic mitral (valve) insufficiency: Secondary | ICD-10-CM | POA: Insufficient documentation

## 2020-05-04 DIAGNOSIS — I313 Pericardial effusion (noninflammatory): Secondary | ICD-10-CM | POA: Diagnosis not present

## 2020-05-04 DIAGNOSIS — R011 Cardiac murmur, unspecified: Secondary | ICD-10-CM | POA: Insufficient documentation

## 2020-05-04 DIAGNOSIS — F17210 Nicotine dependence, cigarettes, uncomplicated: Secondary | ICD-10-CM | POA: Diagnosis not present

## 2020-05-04 LAB — ECHOCARDIOGRAM COMPLETE
Area-P 1/2: 2.37 cm2
Calc EF: 65.1 %
MV M vel: 7.59 m/s
MV Peak grad: 230.4 mmHg
Radius: 0.6 cm
S' Lateral: 1.9 cm
Single Plane A2C EF: 55.2 %
Single Plane A4C EF: 65.9 %

## 2020-05-04 NOTE — Progress Notes (Signed)
  Echocardiogram 2D Echocardiogram has been performed.  [name] 05/04/2020, 10:17 AM

## 2020-05-07 ENCOUNTER — Other Ambulatory Visit: Payer: Self-pay | Admitting: Family Medicine

## 2020-05-07 DIAGNOSIS — I517 Cardiomegaly: Secondary | ICD-10-CM

## 2020-05-16 ENCOUNTER — Telehealth: Payer: Self-pay

## 2020-05-16 ENCOUNTER — Other Ambulatory Visit: Payer: Self-pay | Admitting: Family Medicine

## 2020-05-16 DIAGNOSIS — I517 Cardiomegaly: Secondary | ICD-10-CM

## 2020-05-16 NOTE — Telephone Encounter (Signed)
Referral to [facility] cardiology placed. Can we cancel the referral to [facility] then?   Additionally, it is fine for her to discontinue this if she is not tolerating it. With the way her blood pressure has trended previously, wouldn't expect it to be controlled for much longer without medication. Please have her monitor her BP at home daily. If continuing to rise >130/80, I will send in a mediation called Norvasc for her to start.   Patriciaann Clan, DO

## 2020-05-16 NOTE — Telephone Encounter (Signed)
Patient calls nurse line for two reasons.   First, patient is requesting that she receive new referral to [facility].   Second, patient states that she believes that new BP medication, chlorthalidone, is giving her anxiety. Patient reports that in the evening after taking medication she begins to experience anxiety. Patient states that she did not take medication for two nights and that she did not experience anxiety. Patient reports that BP has been running in the 517'O systolic and 16'W diastolic. Patient is currently asymptomatic.   Advised patient of return precautions.   Please advise.   Talbot Grumbling, RN

## 2020-05-17 NOTE — Telephone Encounter (Signed)
Yes I have cancelled the other referral and sent the newest one to [facility].  Zonnie Landen,CMA

## 2020-05-27 ENCOUNTER — Other Ambulatory Visit: Payer: Self-pay | Admitting: Family Medicine

## 2020-05-27 DIAGNOSIS — R030 Elevated blood-pressure reading, without diagnosis of hypertension: Secondary | ICD-10-CM

## 2020-05-30 NOTE — Progress Notes (Signed)
Date:  05/30/2020  ID:  [name], DOB February 15, 1961, MRN [id number]  PCP:  Patriciaann Clan, DO  Cardiologist: Rex Kras, DO, [facility] (established care 05/30/2020)  REASON FOR CONSULT: Severe concentric left ventricular hypertrophy  REQUESTING PHYSICIAN:  Patriciaann Clan, DO [address],  [place] [address]  Chief Complaint  Patient presents with  . Left Ventricular Hypertrophy  . New Patient (Initial Visit)    HPI  [name] is a 60 y.o. female who presents to the office with a chief complaint of " abnormal echo and thickened heart muscle." Patient's past medical history and cardiovascular risk factors include: COVID-19 infection (01/2020), smoking cigarettes, uncontrolled hypertension, hyperlipidemia, subarachnoid hemorrhage in 2012, COPD, postmenopausal female.  She is referred to the office at the request of Patriciaann Clan, DO for evaluation of severe concentric left ventricular hypertrophy.  Patient states that she recently went for a visit to her PCP was noted to have a cardiac murmur and therefore an echocardiogram was performed.  She had an echocardiogram at [facility] in March 2022 and was noted to have preserved LVEF, with severe concentric LVH, and a peak LV gradient of 234 mmHg.  And therefore she was referred to cardiology for further evaluation and management.  Patient denies any syncope in the past or currently in the near future.  No family history of sudden cardiac death or cardiomyopathy.  She has been experiencing dyspnea on exertion but has been attributing it to post Covid infection etiology.  The severity of her dyspnea on exertion is improving but not resolved.  Patient's blood pressure at today's office visit is also not very well controlled which may be contributing to her effort later dyspnea as well.  Patient states that she has been out of her blood pressure medication for the last 5 days.  At home the best blood pressure reading  that she seen in the recent past is 130/70 mmHg.  She continues to smoke half a pack per day for the last 50 years.  She was started on Crestor last year for her underlying hyperlipidemia.  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 Is able to go up and down stairs without effort related activities. No structured exercise program or daily routine.   ALLERGIES: Allergies  Allergen Reactions  . Aspirin     Palpation,  Shaking, Nausea  . Eggs Or Egg-Derived Products     Nausea  . Metronidazole     REACTION: gives yeast infection  . Zocor [Simvastatin] Rash    MEDICATION LIST PRIOR TO VISIT: Current Meds  Medication Sig  . diltiazem (CARDIZEM CD) 120 MG 24 hr capsule Take 1 capsule (120 mg total) by mouth every morning.  Marland Kitchen losartan (COZAAR) 50 MG tablet Take 1 tablet (50 mg total) by mouth every evening.  . rosuvastatin (CRESTOR) 10 MG tablet Take 1 tablet (10 mg total) by mouth at bedtime. If tolerating 10 mg, can increase to two tablets nightly.  . [DISCONTINUED] chlorthalidone (HYGROTON) 25 MG tablet Take 1 tablet (25 mg total) by mouth daily. Start with 1/2 tab for first few days.     PAST MEDICAL HISTORY: Past Medical History:  Diagnosis Date  . Allergy   . Arthritis   . Elevated blood-pressure reading without diagnosis of hypertension 05/16/2019  . 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 Hyperlipidemia   . Hypertension   . Sickle cell anemia (HCC)    trait  . Stroke ([place]) 07/16/201    PAST SURGICAL HISTORY: Past  Surgical History:  Procedure Laterality Date  . ABDOMINAL HYSTERECTOMY      FAMILY HISTORY: The patient family history includes Congestive Heart Failure in her mother; Diabetes in her maternal grandfather and mother; Heart disease in her paternal grandmother; Hyperlipidemia in her mother; Hypertension in her maternal grandfather and mother; Renal Disease in her mother.  SOCIAL HISTORY:  The patient  reports that she has been smoking cigarettes. She has  been smoking about 0.50 packs per day. She has never used smokeless tobacco. She reports current alcohol use. She reports that she does not use drugs.  REVIEW OF SYSTEMS: Review of Systems  Constitutional: Negative for chills and fever.  HENT: Negative for hoarse voice and nosebleeds.   Eyes: Negative for discharge, double vision and pain.  Cardiovascular: Positive for dyspnea on exertion. Negative for chest pain, claudication, leg swelling, near-syncope, orthopnea, palpitations, paroxysmal nocturnal dyspnea and syncope.  Respiratory: Negative for hemoptysis and shortness of breath.   Musculoskeletal: Negative for muscle cramps and myalgias.  Gastrointestinal: Negative for abdominal pain, constipation, diarrhea, hematemesis, hematochezia, melena, nausea and vomiting.  Neurological: Negative for dizziness and light-headedness.    PHYSICAL EXAM: Vitals with BMI 05/31/2020 04/13/2020 04/06/2020  Height 5' 4"  5' 4"  -  Weight 180 lbs 180 lbs 13 oz 182 lbs 10 oz  BMI 41.32 44.01 -  Systolic 027 253 664  Diastolic 84 82 88  Pulse 71 92 87    CONSTITUTIONAL: Well-developed and well-nourished. No acute distress.  SKIN: Skin is warm and dry. No rash noted. No cyanosis. No pallor. No jaundice HEAD: Normocephalic and atraumatic.  EYES: No scleral icterus MOUTH/THROAT: Moist oral membranes.  NECK: No JVD present. No thyromegaly noted. No carotid bruits  LYMPHATIC: No visible cervical adenopathy.  CHEST Normal respiratory effort. No intercostal retractions  LUNGS: Clear to auscultation bilaterally.  No stridor. No wheezes. No rales.  CARDIOVASCULAR:  Regular, systolic murmur heard at the second right intercostal space, soft holosystolic murmur heard at the apex, no gallops or rubs. ABDOMINAL: No apparent ascites.  EXTREMITIES: No peripheral edema  HEMATOLOGIC: No significant bruising NEUROLOGIC: Oriented to person, place, and time. Nonfocal. Normal muscle tone.  PSYCHIATRIC: Normal mood and  affect. Normal behavior. Cooperative  CARDIAC DATABASE: EKG: 05/31/2020: Normal sinus rhythm, 78 bpm, nonspecific ST-T changes in the high lateral and lateral leads.  Echocardiogram: 05/04/2020:  LVEF 60-65%, no regional wall motion abnormalities, severe concentric LVH, diastolic parameters indeterminate, global longitudinal strain -17.4%, peak LV gradient 234 mmHg, but location in the LV is nonspecific.  Moderate pericardial effusion, mild to moderate MR, no mitral stenosis. PW thickness 2 cm and IVS thickness 1.9cm.    Stress Testing: No results found for this or any previous visit from the past 1095 days.  Heart Catheterization: None  LABORATORY DATA: CBC Latest Ref Rng & Units 04/06/2020 05/13/2019 04/20/2013  WBC 3.4 - 10.8 x10E3/uL 7.4 WILL FOLLOW 4.3(A)  Hemoglobin 11.1 - 15.9 g/dL 13.4 WILL FOLLOW 14.8  Hematocrit 34.0 - 46.6 % 41.0 WILL FOLLOW 46.4  Platelets 150 - 450 x10E3/uL 379 WILL FOLLOW -    CMP Latest Ref Rng & Units 04/06/2020 06/06/2019 05/13/2019  Glucose 65 - 99 mg/dL 139(H) 82 81  BUN 8 - 27 mg/dL 6(L) 10 10  Creatinine 0.57 - 1.00 mg/dL 0.60 0.80 0.84  Sodium 134 - 144 mmol/L 141 142 143  Potassium 3.5 - 5.2 mmol/L 3.4(L) 3.7 4.3  Chloride 96 - 106 mmol/L 104 103 105  CO2 20 - 29 mmol/L 22 23  27  Calcium 8.7 - 10.3 mg/dL 9.4 9.9 10.3(H)  Total Protein 6.0 - 8.5 g/dL - 6.9 -  Total Bilirubin 0.0 - 1.2 mg/dL - 0.3 -  Alkaline Phos 39 - 117 IU/L - 95 -  AST 0 - 40 IU/L - 12 -  ALT 0 - 32 IU/L - 9 -    Lipid Panel  Lab Results  Component Value Date   CHOL 308 (H) 05/13/2019   HDL 38 (L) 05/13/2019   LDLCALC 233 (H) 05/13/2019   TRIG 189 (H) 05/13/2019   CHOLHDL 8.1 (H) 05/13/2019    No components found for: NTPROBNP No results for input(s): PROBNP in the last 8760 hours. Recent Labs    04/06/20 1539  TSH 0.692    BMP Recent Labs    04/06/20 1539  NA 141  K 3.4*  CL 104  CO2 22  GLUCOSE 139*  BUN 6*  CREATININE 0.60  CALCIUM 9.4   GFRNONAA 99  GFRAA 115    HEMOGLOBIN A1C Lab Results  Component Value Date   HGBA1C 6.1 (A) 05/13/2019   MPG 126 (H) 09/10/2010    IMPRESSION:    ICD-10-CM   1. Left ventricular hypertrophy  I51.7 EKG 12-Lead    MR CARDIAC MORPHOLOGY W WO CONTRAST    diltiazem (CARDIZEM CD) 120 MG 24 hr capsule    losartan (COZAAR) 50 MG tablet  2. Pericardial effusion  I31.3 ANA    Sedimentation rate    C-reactive protein    C-reactive protein    Sedimentation rate    ANA  3. Nonrheumatic mitral valve regurgitation  I34.0   4. History of COVID-19  Z86.16   5. Smoking  F17.200   6. Benign hypertension  I10 losartan (COZAAR) 50 MG tablet    Basic metabolic panel    Magnesium    Magnesium    Basic metabolic panel  7. Hypercholesteremia  E78.00   8. Hx of subarachnoid hemorrhage  Z86.79      RECOMMENDATIONS: Makylee Sanborn is a 60 y.o. female whose past medical history and cardiac risk factors include: COVID-19 infection (01/2020), smoking cigarettes, uncontrolled hypertension, hyperlipidemia, subarachnoid hemorrhage in 2012, COPD, postmenopausal female.  Dyspnea on exertion: Multifactorial secondary to severe LVH suggestive of either HCM versus HOCM, uncontrolled hypertension,  mitral regurgitation, recent history of COVID-19 infection. Management as discussed below. Continue to monitor.  Severe left ventricular hypertrophy: Echo in March 2022 notes preserved LVEF, a peak LV gradient 234 mmHg at rest, and posterior directed mitral regurgitation jet.  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   Plan cardiac MRI. Will discontinue diuretic therapy, she is on chlorthalidone. Start diltiazem 120 mg p.o. daily. Start losartan 50 mg p.o. daily. Blood work in 1 week to evaluate kidney function and electrolytes. Global longitudinal strain and -17.4%, strain imaging concerning for possible amyloidosis due to apical sparing.  Will await MRI results.  And  additional testing based on results. Patient is asked to go to the closest ER via EMS if she experiences syncope.  Pericardial effusion: Echocardiogram personally reviewed. Anteriorly directed pericardial effusion of approximately 1.3 cm suggestive of moderate pericardial effusion without tamponade physiology. Check ANA to screen for autoimmune disease Check ESR and CRP for inflammatory processes given her recent COVID-19 infection  Mitral regurgitation: Directed posteriorly most likely secondary to severe LVH as discussed above. Monitor for now.  Benign essential hypertension: Medication changes as directed above.   Would avoid diuretic therapy in the setting of possible HCM  versus HOCM.  Smoking: Tobacco cessation counseling: Currently smoking 0.5 packs/day   Patient was informed of the dangers of tobacco abuse including stroke, cancer, and MI, as well as benefits of tobacco cessation. Patient is willing to quit at this time. Approximately 8 mins were spent counseling patient cessation techniques. We discussed various methods to help quit smoking, including deciding on a date to quit, joining a support group, pharmacological agents- nicotine gum/patch/lozenges, chantix.  I will reassess her progress at the next follow-up visit  Hypercholesterolemia: Last lipid profile from March 2021 reviewed. Patient states that she has been on Crestor for the last 1 year. Currently managed by primary care provider. Does not endorse myalgias.  FINAL MEDICATION LIST END OF ENCOUNTER: Meds ordered this encounter  Medications  . diltiazem (CARDIZEM CD) 120 MG 24 hr capsule    Sig: Take 1 capsule (120 mg total) by mouth every morning.    Dispense:  30 capsule    Refill:  0  . losartan (COZAAR) 50 MG tablet    Sig: Take 1 tablet (50 mg total) by mouth every evening.    Dispense:  30 tablet    Refill:  0    Medications Discontinued During This Encounter  Medication Reason  . chlorthalidone  (HYGROTON) 25 MG tablet Discontinued by provider     Current Outpatient Medications:  .  diltiazem (CARDIZEM CD) 120 MG 24 hr capsule, Take 1 capsule (120 mg total) by mouth every morning., Disp: 30 capsule, Rfl: 0 .  losartan (COZAAR) 50 MG tablet, Take 1 tablet (50 mg total) by mouth every evening., Disp: 30 tablet, Rfl: 0 .  rosuvastatin (CRESTOR) 10 MG tablet, Take 1 tablet (10 mg total) by mouth at bedtime. If tolerating 10 mg, can increase to two tablets nightly., Disp: 90 tablet, Rfl: 1  Orders Placed This Encounter  Procedures  . MR CARDIAC MORPHOLOGY W WO CONTRAST  . Basic metabolic panel  . Magnesium  . ANA  . Sedimentation rate  . C-reactive protein  . EKG 12-Lead    There are no Patient Instructions on file for this visit.   --Continue cardiac medications as reconciled in final medication list. --Return in about 4 weeks (around 06/28/2020) for Follow up, BP, LVH, cardiac MRI. Or sooner if needed. --Continue follow-up with your primary care physician regarding the management of your other chronic comorbid conditions.  Patient's questions and concerns were addressed to her satisfaction. She voices understanding of the instructions provided during this encounter.   This note was created using a voice recognition software as a result there may be grammatical errors inadvertently enclosed that do not reflect the nature of this encounter. Every attempt is made to correct such errors.  Total time spent: 63 minutes discussing history of present illness, reviewing prior echocardiogram results personally including images, discussing disease management, prescribing medications, ordering test results, and answering her questions and concerns to her satisfaction, coordination of care and counseling with regards to her other chronic comorbid conditions also provided during today's encounter.  Rex Kras, [place], [facility]  Pager: [phone] Office: [phone]

## 2020-05-31 ENCOUNTER — Other Ambulatory Visit: Payer: Self-pay

## 2020-05-31 ENCOUNTER — Encounter: Payer: Self-pay | Admitting: Cardiology

## 2020-05-31 ENCOUNTER — Ambulatory Visit: Payer: 59 | Admitting: Cardiology

## 2020-05-31 VITALS — BP 180/84 | HR 71 | Temp 97.2°F | Resp 16 | Ht 64.0 in | Wt 180.0 lb

## 2020-05-31 DIAGNOSIS — I313 Pericardial effusion (noninflammatory): Secondary | ICD-10-CM

## 2020-05-31 DIAGNOSIS — IMO0001 Reserved for inherently not codable concepts without codable children: Secondary | ICD-10-CM

## 2020-05-31 DIAGNOSIS — Z8679 Personal history of other diseases of the circulatory system: Secondary | ICD-10-CM

## 2020-05-31 DIAGNOSIS — I340 Nonrheumatic mitral (valve) insufficiency: Secondary | ICD-10-CM

## 2020-05-31 DIAGNOSIS — E7800 Pure hypercholesterolemia, unspecified: Secondary | ICD-10-CM

## 2020-05-31 DIAGNOSIS — Z8616 Personal history of COVID-19: Secondary | ICD-10-CM

## 2020-05-31 DIAGNOSIS — F17200 Nicotine dependence, unspecified, uncomplicated: Secondary | ICD-10-CM

## 2020-05-31 DIAGNOSIS — I10 Essential (primary) hypertension: Secondary | ICD-10-CM

## 2020-05-31 DIAGNOSIS — I3139 Other pericardial effusion (noninflammatory): Secondary | ICD-10-CM

## 2020-05-31 DIAGNOSIS — I517 Cardiomegaly: Secondary | ICD-10-CM

## 2020-05-31 MED ORDER — LOSARTAN POTASSIUM 50 MG PO TABS: 50.0000 mg | ORAL_TABLET | Freq: Every evening | ORAL | 0 refills | Status: DC

## 2020-05-31 MED ORDER — DILTIAZEM HCL ER COATED BEADS 120 MG PO CP24: 120.0000 mg | ORAL_CAPSULE | Freq: Every morning | ORAL | 0 refills | Status: DC

## 2020-06-09 ENCOUNTER — Encounter: Payer: Self-pay | Admitting: Cardiology

## 2020-06-22 LAB — MAGNESIUM: Magnesium: 2.1 mg/dL (ref 1.6–2.3)

## 2020-06-22 LAB — BASIC METABOLIC PANEL
BUN/Creatinine Ratio: 12 (ref 12–28)
BUN: 9 mg/dL (ref 8–27)
CO2: 25 mmol/L (ref 20–29)
Calcium: 9.8 mg/dL (ref 8.7–10.3)
Chloride: 103 mmol/L (ref 96–106)
Creatinine, Ser: 0.76 mg/dL (ref 0.57–1.00)
Glucose: 114 mg/dL — ABNORMAL HIGH (ref 65–99)
Potassium: 4.4 mmol/L (ref 3.5–5.2)
Sodium: 143 mmol/L (ref 134–144)
eGFR: 89 mL/min/{1.73_m2} (ref >59)

## 2020-06-22 LAB — C-REACTIVE PROTEIN: CRP: 11 mg/L — ABNORMAL HIGH (ref 0–10)

## 2020-06-22 LAB — SEDIMENTATION RATE: Sed Rate: 35 mm/hr (ref 0–40)

## 2020-06-25 LAB — ANA: ANA Titer 1: NEGATIVE

## 2020-06-25 NOTE — Progress Notes (Signed)
Called pt no answer, left a vm

## 2020-06-29 ENCOUNTER — Other Ambulatory Visit: Payer: Self-pay

## 2020-06-29 DIAGNOSIS — I10 Essential (primary) hypertension: Secondary | ICD-10-CM

## 2020-06-29 DIAGNOSIS — I517 Cardiomegaly: Secondary | ICD-10-CM

## 2020-06-29 MED ORDER — LOSARTAN POTASSIUM 50 MG PO TABS: 50.0000 mg | ORAL_TABLET | Freq: Every evening | ORAL | 0 refills | Status: DC

## 2020-06-29 MED ORDER — DILTIAZEM HCL ER COATED BEADS 120 MG PO CP24: 120.0000 mg | ORAL_CAPSULE | Freq: Every morning | ORAL | 0 refills | Status: DC

## 2020-06-29 NOTE — Progress Notes (Signed)
Called and spoke to pt regarding results. Pt voiced understanding. The front will be scheduling pt.

## 2020-06-29 NOTE — Progress Notes (Signed)
Called patient, [name], LMAM

## 2020-07-02 NOTE — Progress Notes (Signed)
Called patient, was on already on phone with front desk.

## 2020-07-02 NOTE — Progress Notes (Signed)
Spoke to patient regarding results and patient is scheduled to get her MRI on 6/8 patient's ov 5/12 was rescheduled for after her MRI

## 2020-07-05 ENCOUNTER — Ambulatory Visit: Payer: 59 | Admitting: Cardiology

## 2020-07-06 ENCOUNTER — Ambulatory Visit: Payer: 59 | Admitting: Cardiology

## 2020-07-31 ENCOUNTER — Other Ambulatory Visit: Payer: Self-pay

## 2020-07-31 DIAGNOSIS — I10 Essential (primary) hypertension: Secondary | ICD-10-CM

## 2020-07-31 DIAGNOSIS — I517 Cardiomegaly: Secondary | ICD-10-CM

## 2020-07-31 MED ORDER — LOSARTAN POTASSIUM 50 MG PO TABS: 50.0000 mg | ORAL_TABLET | Freq: Every evening | ORAL | 0 refills | Status: DC

## 2020-08-09 ENCOUNTER — Other Ambulatory Visit: Payer: Self-pay

## 2020-08-09 DIAGNOSIS — I517 Cardiomegaly: Secondary | ICD-10-CM

## 2020-08-09 DIAGNOSIS — I10 Essential (primary) hypertension: Secondary | ICD-10-CM

## 2020-08-09 MED ORDER — DILTIAZEM HCL ER COATED BEADS 120 MG PO CP24: 120.0000 mg | ORAL_CAPSULE | Freq: Every morning | ORAL | 1 refills | Status: DC

## 2020-08-09 MED ORDER — LOSARTAN POTASSIUM 50 MG PO TABS: 50.0000 mg | ORAL_TABLET | Freq: Every evening | ORAL | 0 refills | Status: DC

## 2020-08-16 ENCOUNTER — Ambulatory Visit: Payer: 59 | Admitting: Cardiology

## 2021-03-25 ENCOUNTER — Other Ambulatory Visit: Payer: Self-pay

## 2021-03-25 ENCOUNTER — Encounter: Payer: Self-pay | Admitting: Family Medicine

## 2021-03-25 ENCOUNTER — Ambulatory Visit (INDEPENDENT_AMBULATORY_CARE_PROVIDER_SITE_OTHER): Payer: 59 | Admitting: Family Medicine

## 2021-03-25 VITALS — BP 169/84 | HR 81 | Wt 192.4 lb

## 2021-03-25 DIAGNOSIS — Z0000 Encounter for general adult medical examination without abnormal findings: Secondary | ICD-10-CM | POA: Diagnosis not present

## 2021-03-25 DIAGNOSIS — I10 Essential (primary) hypertension: Secondary | ICD-10-CM

## 2021-03-25 DIAGNOSIS — I517 Cardiomegaly: Secondary | ICD-10-CM | POA: Diagnosis not present

## 2021-03-25 DIAGNOSIS — I609 Nontraumatic subarachnoid hemorrhage, unspecified: Secondary | ICD-10-CM | POA: Diagnosis not present

## 2021-03-25 DIAGNOSIS — Z720 Tobacco use: Secondary | ICD-10-CM

## 2021-03-25 MED ORDER — ROSUVASTATIN CALCIUM 10 MG PO TABS: 10.0000 mg | ORAL_TABLET | Freq: Every day | ORAL | 1 refills | Status: DC

## 2021-03-25 MED ORDER — LOSARTAN POTASSIUM 50 MG PO TABS: 50.0000 mg | ORAL_TABLET | Freq: Every evening | ORAL | 0 refills | Status: DC

## 2021-03-25 NOTE — Patient Instructions (Signed)
It was great seeing you today!  You came in for your physical and I am glad to see you are doing well!   We are restarting you on your blood pressure and cholesterol medications, and also checking your blood work today. I placed a referral to a different cardiologist so they can follow up on your wall thickening.   For your blood pressure I would like you to take a couple of measurements in the morning and at night and write them down and bring them to the next visit.   Please check-out at the front desk before leaving the clinic. I'd like to see you back in 2-3 weeks to check on the medication we restarted, but if you need to be seen earlier than that for any new issues we're happy to fit you in, just give [place] a call!  Visit Reminders: - Stop by the pharmacy to pick up your prescriptions  - Continue to work on your healthy eating habits and incorporating exercise into your daily life.   If you haven't already, sign up for My Chart to have easy access to your labs results, and communication with your primary care physician.  Feel free to call with any questions or concerns at any time, at [phone].   Take care,  Dr. Shary Key [facility]
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
## 2021-03-26 ENCOUNTER — Telehealth: Payer: Self-pay | Admitting: Family Medicine

## 2021-03-26 ENCOUNTER — Other Ambulatory Visit: Payer: Self-pay | Admitting: Family Medicine

## 2021-03-26 LAB — LIPID PANEL
Chol/HDL Ratio: 7.5 ratio — ABNORMAL HIGH (ref 0.0–4.4)
Cholesterol, Total: 286 mg/dL — ABNORMAL HIGH (ref 100–199)
HDL: 38 mg/dL — ABNORMAL LOW (ref >39)
LDL Chol Calc (NIH): 205 mg/dL — ABNORMAL HIGH (ref 0–99)
Triglycerides: 220 mg/dL — ABNORMAL HIGH (ref 0–149)
VLDL Cholesterol Cal: 43 mg/dL — ABNORMAL HIGH (ref 5–40)

## 2021-03-26 LAB — COMPREHENSIVE METABOLIC PANEL
ALT: 11 IU/L (ref 0–32)
AST: 17 IU/L (ref 0–40)
Albumin/Globulin Ratio: 1.6 (ref 1.2–2.2)
Albumin: 4.2 g/dL (ref 3.8–4.8)
Alkaline Phosphatase: 98 IU/L (ref 44–121)
BUN/Creatinine Ratio: 15 (ref 12–28)
BUN: 11 mg/dL (ref 8–27)
Bilirubin Total: <0.2 mg/dL (ref 0.0–1.2)
CO2: 21 mmol/L (ref 20–29)
Calcium: 9.8 mg/dL (ref 8.7–10.3)
Chloride: 103 mmol/L (ref 96–106)
Creatinine, Ser: 0.73 mg/dL (ref 0.57–1.00)
Globulin, Total: 2.7 g/dL (ref 1.5–4.5)
Glucose: 99 mg/dL (ref 70–99)
Potassium: 4.4 mmol/L (ref 3.5–5.2)
Sodium: 139 mmol/L (ref 134–144)
Total Protein: 6.9 g/dL (ref 6.0–8.5)
eGFR: 94 mL/min/{1.73_m2} (ref >59)

## 2021-03-26 LAB — CBC
Hematocrit: 42.7 % (ref 34.0–46.6)
Hemoglobin: 14 g/dL (ref 11.1–15.9)
MCH: 28.1 pg (ref 26.6–33.0)
MCHC: 32.8 g/dL (ref 31.5–35.7)
MCV: 86 fL (ref 79–97)
Platelets: 344 10*3/uL (ref 150–450)
RBC: 4.99 x10E6/uL (ref 3.77–5.28)
RDW: 13.3 % (ref 11.7–15.4)
WBC: 8.6 10*3/uL (ref 3.4–10.8)

## 2021-04-09 ENCOUNTER — Ambulatory Visit (INDEPENDENT_AMBULATORY_CARE_PROVIDER_SITE_OTHER): Payer: 59 | Admitting: Family Medicine

## 2021-04-09 ENCOUNTER — Other Ambulatory Visit: Payer: Self-pay

## 2021-04-09 ENCOUNTER — Encounter: Payer: Self-pay | Admitting: Family Medicine

## 2021-04-09 ENCOUNTER — Telehealth: Payer: Self-pay | Admitting: Cardiology

## 2021-04-09 VITALS — BP 162/71 | HR 86 | Wt 191.0 lb

## 2021-04-09 DIAGNOSIS — I10 Essential (primary) hypertension: Secondary | ICD-10-CM

## 2021-04-09 NOTE — Telephone Encounter (Signed)
Please schedule it. You can contact Marchia Bond to help arrange this test or speak to [facility].   Dr.Khyla Mccumbers

## 2021-04-09 NOTE — Progress Notes (Signed)
° ° °  SUBJECTIVE:   CHIEF COMPLAINT / HPI:   [name] presents to follow up on blood pressure.  She was seen on 1/30 and was restarted on Losartan 25mg  with the goal to increase to 50mg . She was advised to follow back up with Cardiology who had her on several medications and wanted her to do a cardiac MRI but it was not done. Cardiologist did call patient and will see her after cardiac MRI  Today she expresses frustration that the cardiologist told her that the Lasix a previous resident started caused hardening of her heart. She is wanting to see a provider that can follow with her for more than 3 years at a time.    OBJECTIVE:   BP (!) 162/71    Pulse 86    Wt 191 lb (86.6 kg)    SpO2 98%    BMI 32.79 kg/m    Physical exam  General: well appearing, NAD Cardiovascular: RRR, 2/6 systolic murmur  Lungs: CTAB. Normal WOB Abdomen: soft, non-distended, non-tender Skin: warm, dry. No edema  ASSESSMENT/PLAN:   No problem-specific Assessment & Plan notes found for this encounter.   HTN BP today 162/71 similar on repeat. Asymptomatic last saw [facility] in April 2022. At the time she was supposed to be taking diltiazem 120 mg daily, and losartan 50 mg daily. At last visit I advised to follow back up with cardiology to ensure she is on the right medication. She states she is getting the cardiac MRI and will have an appointment with them after. Will check BMP today and increase Losartan 50mg  daily.   [place]

## 2021-04-09 NOTE — Patient Instructions (Addendum)
It was great seeing you today!  Today we checked on your blood pressure which is still elevated. I do want you to take the Losartan 50mg  daily and continue your diltiazem. We are checking your blood work since restarting the medication. I will call you with anything abnormal. Since you are following with Cardiology we will wait to see if they want to make any changes to your blood pressure regimen.   Feel free to call with any questions or concerns at any time, at [phone].   Take care,  Dr. Shary Key [facility]

## 2021-04-09 NOTE — Telephone Encounter (Signed)
Patient states she has tried to schedule cardiac MRI, and was expecting a phone call from our office or the hospital to schedule appointment. Patient hasn't heard anything and would like to have this appointment scheduled asap. She will schedule her follow up appointment once the cardiac MRI has been completed.

## 2021-04-10 ENCOUNTER — Other Ambulatory Visit (HOSPITAL_COMMUNITY): Payer: Self-pay | Admitting: Cardiology

## 2021-04-10 ENCOUNTER — Other Ambulatory Visit: Payer: Self-pay | Admitting: Cardiology

## 2021-04-10 DIAGNOSIS — I517 Cardiomegaly: Secondary | ICD-10-CM

## 2021-04-10 LAB — BASIC METABOLIC PANEL
BUN/Creatinine Ratio: 13 (ref 12–28)
BUN: 9 mg/dL (ref 8–27)
CO2: 26 mmol/L (ref 20–29)
Calcium: 9.9 mg/dL (ref 8.7–10.3)
Chloride: 105 mmol/L (ref 96–106)
Creatinine, Ser: 0.68 mg/dL (ref 0.57–1.00)
Glucose: 103 mg/dL — ABNORMAL HIGH (ref 70–99)
Potassium: 4.3 mmol/L (ref 3.5–5.2)
Sodium: 144 mmol/L (ref 134–144)
eGFR: 99 mL/min/{1.73_m2} (ref >59)

## 2021-04-22 ENCOUNTER — Ambulatory Visit: Payer: Self-pay | Admitting: Cardiology

## 2021-05-21 ENCOUNTER — Ambulatory Visit (HOSPITAL_COMMUNITY): Payer: 59

## 2021-05-22 ENCOUNTER — Telehealth (HOSPITAL_COMMUNITY): Payer: Self-pay | Admitting: *Deleted

## 2021-05-22 ENCOUNTER — Telehealth (HOSPITAL_COMMUNITY): Payer: Self-pay | Admitting: Emergency Medicine

## 2021-05-22 NOTE — Telephone Encounter (Signed)
Attempted to call patient regarding upcoming cardiac MR appointment. Left message on voicemail with name and callback number Brionne Mertz RN Navigator Cardiac Imaging [facility] [phone] Office [phone] Cell  

## 2021-05-22 NOTE — Telephone Encounter (Signed)
Patient returning call regarding upcoming cardiac imaging study; pt verbalizes understanding of appt date/time, parking situation and where to check in, and verified current allergies; name and call back number provided for further questions should they arise ? ?Gordy Clement RN Navigator Cardiac Imaging ?[facility] ?[phone] office ?[phone] cell ? ?Patient states she's had an MRI in the past without any issues. Denies difficult IV. ?

## 2021-05-23 ENCOUNTER — Telehealth (HOSPITAL_COMMUNITY): Payer: Self-pay | Admitting: *Deleted

## 2021-05-23 ENCOUNTER — Ambulatory Visit (HOSPITAL_COMMUNITY): Admission: RE | Admit: 2021-05-23 | Payer: 59 | Source: Ambulatory Visit

## 2021-05-23 NOTE — Telephone Encounter (Signed)
Received call from patient stating she is unable to pay her cardiac MRI and would like to cancel today. Informed her to that I would cancel the appointment. ? ?Gordy Clement RN Navigator Cardiac Imaging ?[facility] ?[phone] Office ?[phone] Cell ? ?

## 2021-06-23 ENCOUNTER — Other Ambulatory Visit: Payer: Self-pay | Admitting: Cardiology

## 2021-06-23 DIAGNOSIS — I10 Essential (primary) hypertension: Secondary | ICD-10-CM

## 2021-06-23 DIAGNOSIS — I517 Cardiomegaly: Secondary | ICD-10-CM

## 2021-07-30 ENCOUNTER — Encounter: Payer: Self-pay | Admitting: *Deleted

## 2021-07-30 ENCOUNTER — Other Ambulatory Visit: Payer: Self-pay

## 2021-07-30 ENCOUNTER — Encounter: Payer: Self-pay | Admitting: Family Medicine

## 2021-07-30 ENCOUNTER — Ambulatory Visit (INDEPENDENT_AMBULATORY_CARE_PROVIDER_SITE_OTHER): Payer: 59 | Admitting: Family Medicine

## 2021-07-30 VITALS — BP 169/72 | HR 75 | Wt 186.0 lb

## 2021-07-30 DIAGNOSIS — Z1231 Encounter for screening mammogram for malignant neoplasm of breast: Secondary | ICD-10-CM | POA: Diagnosis not present

## 2021-07-30 DIAGNOSIS — Z1211 Encounter for screening for malignant neoplasm of colon: Secondary | ICD-10-CM

## 2021-07-30 DIAGNOSIS — I10 Essential (primary) hypertension: Secondary | ICD-10-CM | POA: Diagnosis not present

## 2021-07-30 MED ORDER — DILTIAZEM HCL ER 120 MG PO TB24: 120.0000 mg | ORAL_TABLET | Freq: Every day | ORAL | 0 refills | Status: DC

## 2021-07-30 MED ORDER — SPIRONOLACTONE 25 MG PO TABS: 25.0000 mg | ORAL_TABLET | Freq: Every day | ORAL | 0 refills | Status: DC

## 2021-07-30 NOTE — Patient Instructions (Signed)
It was great seeing you today!  You came in for follow-up on your hypertension.  We are starting you on 2 medications to help lower your blood pressure and it would be very important to take them daily: Your diltiazem and spironolactone.  It will be important to get you back to see cardiology for further work-up and care, so I have also placed a referral to our chronic care management team to help provide any assistance if possible.  They will reach out to you by phone.  Please check-out at the front desk before leaving the clinic. I'd like to see you back in 1-2 weeks for follow up on the medication, but if you need to be seen earlier than that for any new issues we're happy to fit you in, just give [place] a call!  Visit Reminders: - Stop by the pharmacy to pick up your prescriptions  - Continue to work on your healthy eating habits and incorporating exercise into your daily life.   Feel free to call with any questions or concerns at any time, at [phone].   Take care,  Dr. Shary Key [facility]

## 2021-07-30 NOTE — Progress Notes (Signed)
    SUBJECTIVE:   CHIEF COMPLAINT / HPI:   Patient presents for blood pressure follow up.  She was seen in February and her losartan was increased to 50 mg daily. She was needing to get a cardiac MRI done and was advised to follow back up with cardiology. Today she states she hasnt seen Cardiology again because she owes them money. BP 169/72. She states she stopped taking Losartan 1 month ago because she feels the Losartan was causing heart palpitation and head throbbing. Denies current headache, chest pain. Does endorses being chronically SOB since having COVID  Smokes a half pack per day. Not ready to quit  [facility] a lot with job.   Health maintenance: Due for mammogram and colonoscopy  PERTINENT  PMH / PSH: Reviewed  OBJECTIVE:   BP (!) 169/72   Pulse 75   Wt 186 lb (84.4 kg)   SpO2 99%   BMI 31.93 kg/m    Physical exam General: well appearing, NAD Cardiovascular: RRR, systolic murmur appreciated  Lungs: CTAB. Normal WOB Abdomen: soft, non-distended, non-tender Skin: warm, dry. No edema  ASSESSMENT/PLAN:   Hypertension BP 169/72. Asymptomatic. Echocardiogram at [facility] in March 2022 and was noted to have preserved LVEF, with severe concentric LVH, and a peak LV gradient of 234 mmHg. Stopped taking Losartan 1 month ago because she felt it was causing her headaches and heart palpitations. Has not followed up with cardiology because she owes them money. Stressed the severity of her heart condition and strongly recommended she take her blood pressure medication and follow up with cardiology which she was agreeable to. Restarted Diltiazem '120mg'$  daily and added spironolactone '25mg'$  daily. Patient agreed to [facility] referral for any additional assistance as well. Will follow up in 1-2 weeks to check on medication.     Health maintenance Mammogram and colonoscopy ordered  Smoking cessation discussed but patient not ready to work on quitting   Shary Key, [place]

## 2021-07-31 ENCOUNTER — Other Ambulatory Visit (HOSPITAL_COMMUNITY): Payer: Self-pay

## 2021-08-01 ENCOUNTER — Encounter: Payer: Self-pay | Admitting: Gastroenterology

## 2021-08-01 ENCOUNTER — Telehealth: Payer: Self-pay | Admitting: *Deleted

## 2021-08-01 NOTE — Assessment & Plan Note (Signed)
BP 169/72. Asymptomatic. Echocardiogram at [facility] in March 2022 and was noted to have preserved LVEF, with severe concentric LVH, and a peak LV gradient of 234 mmHg. Stopped taking Losartan 1 month ago because she felt it was causing her headaches and heart palpitations. Has not followed up with cardiology because she owes them money. Stressed the severity of her heart condition and strongly recommended she take her blood pressure medication and follow up with cardiology which she was agreeable to. Restarted Diltiazem '120mg'$  daily and added spironolactone '25mg'$  daily. Patient agreed to [facility] referral for any additional assistance as well. Will follow up in 1-2 weeks to check on medication.

## 2021-08-01 NOTE — Chronic Care Management (AMB) (Signed)
  Care Management   Note  08/01/2021 Name: [name] MRN: [id number] DOB: 1961/01/15  [name] is a 61 y.o. year old female who is a primary care patient of Shary Key, DO. I reached out to [employer] by phone today offer care coordination services.   [name] was given information about care management services today including:  Care management services include personalized support from designated clinical staff supervised by her physician, including individualized plan of care and coordination with other care providers 24/7 contact phone numbers for assistance for urgent and routine care needs. The patient may stop care management services at any time by phone call to the office staff.  Patient agreed to services and verbal consent obtained.   Follow up plan: Telephone appointment with care management team member scheduled for:08/14/21  [place] Management  Direct Dial: [phone]

## 2021-08-05 ENCOUNTER — Ambulatory Visit
Admission: RE | Admit: 2021-08-05 | Discharge: 2021-08-05 | Disposition: A | Discharge status: Home / Self Care | Payer: 59 | Source: Ambulatory Visit | Attending: Family Medicine | Admitting: Family Medicine

## 2021-08-05 DIAGNOSIS — Z1231 Encounter for screening mammogram for malignant neoplasm of breast: Secondary | ICD-10-CM

## 2021-08-14 ENCOUNTER — Telehealth: Payer: Self-pay | Admitting: Licensed Clinical Social Worker

## 2021-08-14 ENCOUNTER — Telehealth: Payer: 59

## 2021-08-14 NOTE — Telephone Encounter (Signed)
  Care Management   Follow Up Note   08/14/2021 Name: Aneesah Hernan MRN: [id number] DOB: January 30, 1961   Referred by: Shary Key, DO Reason for referral : No chief complaint on file.   An unsuccessful telephone outreach was attempted today. The patient was referred to the case management team for assistance with care management and care coordination.   Patient answered phone then hung up on. SW attempted twice more and patient sent SW to VM.  SW left a VM with detailed message and contact information. SW attempted once more and was sent to VM.  Follow Up Plan: The patient has been provided with contact information for the care management team and has been advised to call with any health related questions or concerns.   Lenor Derrick, MSW  Social Worker [facility] Care Management  [phone]

## 2021-08-16 ENCOUNTER — Ambulatory Visit (AMBULATORY_SURGERY_CENTER): Payer: Self-pay

## 2021-08-16 VITALS — Ht 65.0 in | Wt 184.0 lb

## 2021-08-16 DIAGNOSIS — Z86010 Personal history of colonic polyps: Secondary | ICD-10-CM

## 2021-08-16 MED ORDER — PLENVU 140 G PO SOLR: 1.0000 | ORAL | 0 refills | Status: DC

## 2021-08-16 NOTE — Progress Notes (Signed)
No egg or soy allergy known to patient - patient reports she is able to eat pies and cakes that have eggs in them but is more or less unable to eat gluten;  No issues known to pt with past sedation with any surgeries or procedures Patient denies ever being told they had issues or difficulty with intubation  No FH of Malignant Hyperthermia Pt is not on diet pills Pt is not on home 02  Pt is not on blood thinners  Pt reports issues with constipation - uses MOM to assist with constipation when needed; has not been having issues lately;  No A fib or A flutter Coupon to pt in PV today, Code to Pharmacy NO PA's for preps discussed with pt in PV today  Discussed with pt there will be an out-of-pocket cost for prep and that varies from $0 to 70 +  dollars - pt verbalized understanding  Pt instructed to use Single[employer]com or GoodRx for a price reduction on prep  Pt verified name, DOB, address and insurance during PV today.  Pt given instruction packet to read and not return.  Pt encouraged to call with questions or issues.  Pt has My chart, procedure instructions sent via My Chart

## 2021-08-28 ENCOUNTER — Ambulatory Visit: Payer: 59 | Admitting: Licensed Clinical Social Worker

## 2021-08-28 NOTE — Chronic Care Management (AMB) (Signed)
  Care Management   Social Work Visit Note  08/28/2021 Name: [name] MRN: [id number] DOB: [birth year]  [name] is a 61 y.o. year old female who sees Brittany Key, DO for primary care. The care management team was consulted for assistance with care management and care coordination needs related to Financial Difficulties related to MRI Scan needed.    Patient was given the following information about care management and care coordination services today, agreed to services, and gave verbal consent: 1.care management/care coordination services include personalized support from designated clinical staff supervised by their physician, including individualized plan of care and coordination with other care providers 2. 24/7 contact phone numbers for assistance for urgent and routine care needs. 3. The patient may stop care management/care coordination services at any time by phone call to the office staff.  Engaged with patient by telephone for initial visit in response to provider referral for social work chronic care management and care coordination services.  Assessment: Review of patient history, allergies, and health status during evaluation of patient need for care management/care coordination services.    Interventions:  Patient interviewed and appropriate assessments performed Collaborated with clinical team regarding patient needs  Patient seeking financial assistance with co-pay for MRI.  SW educated patient on [employer]  and gave patient information. Patient is not eligible for medicaid at this time. SW completed SDOH and needs assessment. No other concerns present.  SDOH (Social Determinants of Health) assessments performed: Yes SDOH Interventions    Flowsheet Row Most Recent Value  SDOH Interventions   Financial Strain Interventions Financial Counselor         Plan:  Patient has SW contact information. Patient will outreach SW if needed.  Lenor Derrick, MSW  Social Worker [facility]  [phone]

## 2021-08-28 NOTE — Patient Instructions (Signed)
Visit Information  Instructions:   Patient was given the following information about care management and care coordination services today, agreed to services, and gave verbal consent: 1.care management/care coordination services include personalized support from designated clinical staff supervised by their physician, including individualized plan of care and coordination with other care providers 2. 24/7 contact phone numbers for assistance for urgent and routine care needs. 3. The patient may stop care management/care coordination services at any time by phone call to the office staff.  Patient verbalizes understanding of instructions and care plan provided today and agrees to view in MyChart. Active MyChart status and patient understanding of how to access instructions and care plan via MyChart confirmed with patient.     No further follow up required: .  Krystle Polcyn, BSW , MSW Social Worker [facility]  [phone]      

## 2021-08-29 ENCOUNTER — Ambulatory Visit (INDEPENDENT_AMBULATORY_CARE_PROVIDER_SITE_OTHER): Payer: 59 | Admitting: Family Medicine

## 2021-08-29 ENCOUNTER — Other Ambulatory Visit: Payer: Self-pay

## 2021-08-29 ENCOUNTER — Encounter: Payer: Self-pay | Admitting: Family Medicine

## 2021-08-29 VITALS — BP 141/72 | HR 72 | Wt 186.0 lb

## 2021-08-29 DIAGNOSIS — R0600 Dyspnea, unspecified: Secondary | ICD-10-CM | POA: Insufficient documentation

## 2021-08-29 DIAGNOSIS — I10 Essential (primary) hypertension: Secondary | ICD-10-CM | POA: Diagnosis not present

## 2021-08-29 MED ORDER — SPIRONOLACTONE 50 MG PO TABS: 50.0000 mg | ORAL_TABLET | Freq: Every day | ORAL | 3 refills | Status: DC

## 2021-08-29 NOTE — Progress Notes (Signed)
    SUBJECTIVE:   CHIEF COMPLAINT / HPI:   Patient presents for follow up on her BP. She was seen on 6/6 and restarted on Diltiazem '120mg'$  daily and spironolactone '25mg'$  daily. No side effects. Denies CP, headaches, vision changes. Endorses feeling SOB since COVID especially with going up the stairs. Not occurring at rest. No Hx of asthma. No new cough or recent illness. Does still endorse smoking cigarettes a half a pack per day.   Mammogram performed last month. Colonsocpy scheduled for the 28th   PERTINENT  PMH / PSH: Reviewed   OBJECTIVE:   BP (!) 141/72   Pulse 72   Wt 186 lb (84.4 kg)   SpO2 98%   BMI 30.95 kg/m    General: alert, pleasant, NAD CV: RRR , systolic murmur  Resp: CTAB normal WOB  GI: soft, non distended Derm: warm, dry. No LE edema   ASSESSMENT/PLAN:   Hypertension BP 141/72 which is improved from prior visits. 1 month ago restarted on Diltiazem '120mg'$  daily and spironolactone '25mg'$  daily and tolerating well. Will increase [name] to '50mg'$  daily to get closer to goal. Will also check BMP today. Return at the end of the month for follow up.   Dyspnea Occurring on exertion, increasing over the past couple of years. Tried family member's inhaler with relief. Cigarette smoking likely contributing but patient not ready to quit. Scheduled appt with Dr. Valentina Lucks at the end of the month for PFTs and to start weaning down cigarette use if possible. Discussed with patient who is agreeable with plan.    Health maintenance Mammogram performed last month and was normal. Colonoscopy scheduled for 7/28   [place]

## 2021-08-29 NOTE — Assessment & Plan Note (Signed)
BP 141/72 which is improved from prior visits. 1 month ago restarted on Diltiazem '120mg'$  daily and spironolactone '25mg'$  daily and tolerating well. Will increase [name] to '50mg'$  daily to get closer to goal. Will also check BMP today. Return at the end of the month for follow up.

## 2021-08-29 NOTE — Assessment & Plan Note (Signed)
Occurring on exertion, increasing over the past couple of years. Tried family member's inhaler with relief. Cigarette smoking likely contributing but patient not ready to quit. Scheduled appt with Dr. Valentina Lucks at the end of the month for PFTs and to start weaning down cigarette use if possible. Discussed with patient who is agreeable with plan.

## 2021-08-29 NOTE — Patient Instructions (Addendum)
It was great seeing you today!  You were seen for follow up and I am glad your blood pressure is lowering. We will increase your spironolactone to '50mg'$  a day.   I scheduled you with our pharmacist for lung function testing as well (see date below). If you need to change this time please call the clinic.   Please check-out at the front desk before leaving the clinic. I'd like to see you back in a couple of weeks to check on blood pressure after this change, but if you need to be seen earlier than that for any new issues we're happy to fit you in, just give [place] a call!  Visit Reminders: - Stop by the pharmacy to pick up your prescriptions  - Continue to work on your healthy eating habits and incorporating exercise into your daily life.   Feel free to call with any questions or concerns at any time, at [phone].   Take care,  Dr. Shary Key [facility]

## 2021-08-30 LAB — BASIC METABOLIC PANEL
BUN/Creatinine Ratio: 15 (ref 12–28)
BUN: 11 mg/dL (ref 8–27)
CO2: 25 mmol/L (ref 20–29)
Calcium: 9.6 mg/dL (ref 8.7–10.3)
Chloride: 102 mmol/L (ref 96–106)
Creatinine, Ser: 0.71 mg/dL (ref 0.57–1.00)
Glucose: 86 mg/dL (ref 70–99)
Potassium: 4.1 mmol/L (ref 3.5–5.2)
Sodium: 141 mmol/L (ref 134–144)
eGFR: 96 mL/min/{1.73_m2} (ref >59)

## 2021-09-12 ENCOUNTER — Ambulatory Visit: Payer: 59 | Admitting: Pharmacist

## 2021-09-13 ENCOUNTER — Other Ambulatory Visit: Payer: Self-pay | Admitting: Family Medicine

## 2021-09-13 DIAGNOSIS — I609 Nontraumatic subarachnoid hemorrhage, unspecified: Secondary | ICD-10-CM

## 2021-09-16 ENCOUNTER — Encounter: Payer: Self-pay | Admitting: Gastroenterology

## 2021-09-20 ENCOUNTER — Encounter: Payer: Self-pay | Admitting: Gastroenterology

## 2021-09-20 ENCOUNTER — Ambulatory Visit (AMBULATORY_SURGERY_CENTER): Payer: 59 | Admitting: Gastroenterology

## 2021-09-20 VITALS — BP 181/74 | HR 64 | Temp 97.8°F | Resp 22 | Ht 65.0 in | Wt 184.0 lb

## 2021-09-20 DIAGNOSIS — Z86010 Personal history of colonic polyps: Secondary | ICD-10-CM

## 2021-09-20 DIAGNOSIS — D122 Benign neoplasm of ascending colon: Secondary | ICD-10-CM

## 2021-09-20 DIAGNOSIS — D123 Benign neoplasm of transverse colon: Secondary | ICD-10-CM | POA: Diagnosis not present

## 2021-09-20 DIAGNOSIS — Z09 Encounter for follow-up examination after completed treatment for conditions other than malignant neoplasm: Secondary | ICD-10-CM

## 2021-09-20 MED ORDER — SODIUM CHLORIDE 0.9 % IV SOLN: 500.0000 mL | Freq: Once | INTRAVENOUS | Status: DC

## 2021-09-20 NOTE — Progress Notes (Signed)
To pacu, VSS. Report to [email or web address] 

## 2021-09-20 NOTE — Patient Instructions (Signed)
   Handouts provided about polyps and diverticulosis.  Await pathology results.  Resume previous diet.  Continue present medications.    YOU HAD AN ENDOSCOPIC PROCEDURE TODAY AT [facility]:   Refer to the procedure report that was given to you for any specific questions about what was found during the examination.  If the procedure report does not answer your questions, please call your gastroenterologist to clarify.  If you requested that your care partner not be given the details of your procedure findings, then the procedure report has been included in a sealed envelope for you to review at your convenience later.  YOU SHOULD EXPECT: Some feelings of bloating in the abdomen. Passage of more gas than usual.  Walking can help get rid of the air that was put into your GI tract during the procedure and reduce the bloating. If you had a lower endoscopy (such as a colonoscopy or flexible sigmoidoscopy) you may notice spotting of blood in your stool or on the toilet paper. If you underwent a bowel prep for your procedure, you may not have a normal bowel movement for a few days.  Please Note:  You might notice some irritation and congestion in your nose or some drainage.  This is from the oxygen used during your procedure.  There is no need for concern and it should clear up in a day or so.  SYMPTOMS TO REPORT IMMEDIATELY:  Following lower endoscopy (colonoscopy or flexible sigmoidoscopy):  Excessive amounts of blood in the stool  Significant tenderness or worsening of abdominal pains  Swelling of the abdomen that is new, acute  Fever of 100F or higher   For urgent or emergent issues, a gastroenterologist can be reached at any hour by calling [phone]. Do not use MyChart messaging for urgent concerns.    DIET:  We do recommend a small meal at first, but then you may proceed to your regular diet.  Drink plenty of fluids but you should avoid alcoholic beverages for 24  hours.  ACTIVITY:  You should plan to take it easy for the rest of today and you should NOT DRIVE or use heavy machinery until tomorrow (because of the sedation medicines used during the test).    FOLLOW UP: Our staff will call the number listed on your records the next business day following your procedure.  We will call around 7:15- 8:00 am to check on you and address any questions or concerns that you may have regarding the information given to you following your procedure. If we do not reach you, we will leave a message.  If you develop any symptoms (ie: fever, flu-like symptoms, shortness of breath, cough etc.) before then, please call [phone].  If you test positive for Covid 19 in the 2 weeks post procedure, please call and report this information to [place].    If any biopsies were taken you will be contacted by phone or by letter within the next 1-3 weeks.  Please call us at [phone] if you have not heard about the biopsies in 3 weeks.    SIGNATURES/CONFIDENTIALITY: You and/or your care partner have signed paperwork which will be entered into your electronic medical record.  These signatures attest to the fact that that the information above on your After Visit Summary has been reviewed and is understood.  Full responsibility of the confidentiality of this discharge information lies with you and/or your care-partner.

## 2021-09-20 NOTE — Progress Notes (Signed)
Pt's states no medical or surgical changes since previsit or office visit. 

## 2021-09-20 NOTE — Progress Notes (Signed)
HPI: This is a woman with h/o polyps  Colonoscopy 2012 Dr. 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 Left sided diverticulosis also noted. She was recommended to have repeat colonoscopy at 3 year interval.   ROS: complete GI ROS as described in HPI, all other review negative.  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     diltiazem (CARDIZEM LA) 120 MG 24 hr tablet Take 1 tablet (120 mg total) by mouth daily. 90 tablet 0   Multiple Vitamin (MULTIVITAMIN PO) Take 2 tablets by mouth daily at 6 (six) AM.     rosuvastatin (CRESTOR) 10 MG tablet TAKE 1 TABLET BY MOUTH AT BEDTIME IF  TOLERATING  1  TABLET  CAN  INCREASE  TO  2  TABLETS  NIGHTLY 90 tablet 0   spironolactone (ALDACTONE) 50 MG tablet Take 1 tablet (50 mg total) by mouth at bedtime. 90 tablet 3   Current Facility-Administered Medications  Medication Dose Route Frequency Provider Last Rate Last Admin   0.9 %  sodium chloride infusion  500 mL Intravenous Once Milus Banister, MD        Allergies as of 09/20/2021 - Review Complete 09/20/2021  Allergen Reaction Noted   Aspirin  08/21/2010   Eggs or egg-derived products  09/20/2010   Gluten meal Nausea And Vomiting and Other (See Comments) 08/16/2021   Metronidazole     Milk-related compounds Nausea And Vomiting and Other (See Comments) 08/16/2021   Tuna flavor Hives, Itching, and Rash 08/16/2021   Zocor [simvastatin] Rash 05/08/2010    Family History   Problem Relation Age of Onset   Hyperlipidemia Mother    Hypertension Mother    Diabetes Mother    Renal Disease Mother    Congestive Heart Failure Mother    Hypertension Maternal Grandfather    Diabetes Maternal Grandfather    Heart disease Paternal Grandmother        died from mycardial infarcation   Colon polyps Daughter 77   Colon cancer Neg Hx    Esophageal cancer Neg Hx    Stomach cancer Neg Hx    Rectal cancer Neg Hx     Social History   Socioeconomic History   Marital status: Single    Spouse name: Not on file   Number of children: 1   Years of education: Not on file   Highest education level: Not on file  Occupational History   Not on file  Tobacco Use   Smoking status: Every Day    Packs/day: 0.50    Types: Cigarettes   Smokeless tobacco: Never  Vaping Use   Vaping Use: Never used  Substance and Sexual Activity   Alcohol use: Not Currently    Comment: OCCASIONAL   Drug use: No   Sexual activity: Yes    Birth control/protection: Surgical, Condom  Other Topics Concern   Not on file  Social History Narrative   Financial  assistance approved for 100% discount at [facility] and has [facility] card per Bonna Gains   03/14/2010         Social Determinants of Health   Financial Resource Strain: High Risk (08/28/2021)   Overall Financial Resource Strain (CARDIA)    Difficulty of Paying Living Expenses: Very hard  Food Insecurity: No Food Insecurity (08/28/2021)   Hunger Vital Sign    Worried About Running Out of Food in the Last Year: Never true    Ran Out of Food in the Last Year: Never true  Transportation Needs: No Transportation Needs (08/28/2021)   PRAPARE - Hydrologist (Medical): No    Lack of Transportation (Non-Medical): No  Physical Activity: Not on file  Stress: Not on file  Social Connections: Not on file  Intimate Partner Violence: Not on file     Physical Exam: BP 129/80   Pulse 79   Temp 97.8 F (36.6 C) (Temporal)    Ht '5\' 5"'$  (1.651 m)   Wt 184 lb (83.5 kg)   SpO2 95%   BMI 30.62 kg/m  Constitutional: generally well-appearing Psychiatric: alert and oriented x3 Lungs: CTA bilaterally Heart: no MCR  Assessment and plan: 61 y.o. female with h/o polyps  Surveillance colonsocopyt oday  Care is appropriate for the ambulatory setting.  Owens Loffler, MD [facility] 09/20/2021, 8:55 AM

## 2021-09-20 NOTE — Progress Notes (Signed)
Called to room to assist during endoscopic procedure.  Patient ID and intended procedure confirmed with present staff. Received instructions for my participation in the procedure from the performing physician.  

## 2021-09-20 NOTE — Op Note (Signed)
[facility] Patient Name: [name] Procedure Date: 09/20/2021 9:02 AM MRN: [id number] Endoscopist: Milus Banister , MD Age: 61 Referring MD:  Date of Birth: 11-Dec-1960 Gender: Female Account #: [id number] Procedure:                Colonoscopy Indications:              High risk colon cancer surveillance: Personal                            history of colonic polyps; Colonoscopy 2012 Dr.                            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 Left sided                            diverticulosis also noted. She was recommended to                            have repeat colonoscopy at 3 year interval Medicines:                Monitored Anesthesia Care Procedure:                Pre-Anesthesia Assessment:                           - Prior to the procedure, a History and Physical                            was performed, and patient medications and                            allergies were reviewed. The patient's tolerance of                            previous anesthesia was also reviewed. The risks                            and benefits of the procedure and the sedation                            options and risks were discussed with the patient.                            All questions were answered, and informed consent                            was obtained. Prior Anticoagulants: The patient has                            taken no previous anticoagulant or antiplatelet                            agents. ASA Grade Assessment: II - A patient with  mild systemic disease. After reviewing the risks                            and benefits, the patient was deemed in                            satisfactory condition to undergo the procedure.                           After obtaining informed consent, the colonoscope                            was passed under direct vision. Throughout the                            procedure, the patient's blood  pressure, pulse, and                            oxygen saturations were monitored continuously. The                            Olympus CF-HQ190L (Serial# [id number]) Colonoscope was                            introduced through the anus and advanced to the the                            cecum, identified by appendiceal orifice and                            ileocecal valve. The colonoscopy was performed                            without difficulty. The patient tolerated the                            procedure well. The quality of the bowel                            preparation was good. The ileocecal valve,                            appendiceal orifice, and rectum were photographed. Scope In: 9:05:53 AM Scope Out: 9:23:04 AM Scope Withdrawal Time: 0 hours 13 minutes 51 seconds  Total Procedure Duration: 0 hours 17 minutes 11 seconds  Findings:                 Two sessile polyps were found in the transverse                            colon and ascending colon. The polyps were 3 to 4                            mm in size. These polyps were removed  with a cold                            snare. Resection and retrieval were complete.                           Multiple small and large-mouthed diverticula were                            found in the entire colon.                           The exam was otherwise without abnormality on                            direct and retroflexion views. Complications:            No immediate complications. Estimated blood loss:                            None. Estimated Blood Loss:     Estimated blood loss: none. Impression:               - Two 3 to 4 mm polyps in the transverse colon and                            in the ascending colon, removed with a cold snare.                            Resected and retrieved.                           - Diverticulosis in the entire examined colon.                           - The examination was otherwise normal on  direct                            and retroflexion views. Recommendation:           - Patient has a contact number available for                            emergencies. The signs and symptoms of potential                            delayed complications were discussed with the                            patient. Return to normal activities tomorrow.                            Written discharge instructions were provided to the                            patient.                           -  Resume previous diet.                           - Continue present medications.                           - Await pathology results. Milus Banister, MD 09/20/2021 9:25:58 AM This report has been signed electronically.

## 2021-09-23 ENCOUNTER — Telehealth: Payer: Self-pay

## 2021-09-23 ENCOUNTER — Ambulatory Visit (INDEPENDENT_AMBULATORY_CARE_PROVIDER_SITE_OTHER): Payer: 59 | Admitting: Pharmacist

## 2021-09-23 ENCOUNTER — Encounter: Payer: Self-pay | Admitting: Pharmacist

## 2021-09-23 DIAGNOSIS — Z720 Tobacco use: Secondary | ICD-10-CM | POA: Diagnosis not present

## 2021-09-23 DIAGNOSIS — J449 Chronic obstructive pulmonary disease, unspecified: Secondary | ICD-10-CM

## 2021-09-23 NOTE — Patient Instructions (Addendum)
It was nice to see you today!  Your lung function test showed moderate obstruction with reversibility.   Medication Changes: Continue current inhaler.   Please keep your appointment with me in 1 month.  Your short-term goal is to smoke 5 or less per day when you return.

## 2021-09-23 NOTE — Progress Notes (Unsigned)
   S:     No chief complaint on file.  [name] is a 61 y.o. female who presents for lung function evaluation.  PMH is significant for > 40 years of smoking.  Patient was referred and last seen by Primary Care Provider, Dr. Arby Barrette, on 08/29/2021.  At last visit, reports of dyspnea and lack of interest in quitting smoking .   Patient reports breathing has been a problem at times.  Notes she has been using Levalbuterol (xopenx) - 2 puffs PRN which she has from a family member.  She notes that it does help when she is severely short of breath.   Medication adherence reported *** Patient reports last dose of COPD medications was *** Current COPD medications: *** Rescue inhaler use frequency: *** Patient exacerbation hx: ***  O: ROS  Physical Exam  There were no vitals filed for this visit.  mMRC score= *** CAT score= *** See Documentation Flowsheet - CAT/COPD for complete symptom scoring.  See "scanned report" or Documentation Flowsheet (discrete results - PFTs) for Spirometry results. Patient provided good effort while attempting spirometry.   Lung Age =  Albuterol Neb  Lot# [id number]     Exp. 08/2022  Patient is participating in a Managed Medicaid Plan:  {MM YES/NO:27447::"Yes"}   A/P: Patient has been experiencing *** for *** and taking ***. Spirometry evaluation with pre- and post-bronchodilator reveals normal lung function.   ***Patient has been experiencing *** for *** and taking ***.  Spirometry evaluation reveals {mild/mod/sev:19539} restrictive lung disease. Post nebulized albuterol tx revealed ***.    ***Patient has been experiencing *** for *** and taking ***. Spirometry evaluation reveals ***, post nebulized albuterol tx revealed ***, indicative of non-reversible obstructive lung disease. Spirometry GOLD Treatment Group A/B/E based on CAT score and exacerbations in the last year.    Patient with {DESC; GOOD/FAIR/POOR:18685} inhaler technique. Patient medication  adherence ***.  -{Medication changes:31355} ***  -Educated patient on purpose, proper use, potential adverse effects including *** risk of esophageal candidiasis and need to rinse mouth after each use.   -Reviewed results of pulmonary function tests.  Pt verbalized understanding of results and education.    Written patient instructions provided.   Total time in face to face counseling *** minutes.    Follow-up:  Pharmacist ***. PCP clinic visit ***.  Patient seen with ***.

## 2021-09-23 NOTE — Telephone Encounter (Signed)
Left message on follow up call. 

## 2021-09-24 ENCOUNTER — Encounter: Payer: Self-pay | Admitting: Pharmacist

## 2021-09-24 NOTE — Progress Notes (Signed)
Reviewed: I agree with Dr. Koval's documentation and management. 

## 2021-09-24 NOTE — Assessment & Plan Note (Signed)
Patient has been experiencing for dyspnea for a prolonged period of time and taking levalbuterol she has received from a family member. Spirometry evaluation with pre- and post-bronchodilator reveals reversible moderate obstructive lung disease. Spirometry GOLD Treatment Group B based on CAT score and exacerbations in the last year.  LUng age of 58 was motivating for patient to reevaluate need for tobacco cessation.  Patient educated on proper use of Xopenx (levalbuterol) MDI.  Following instruction patient demonstrated  good inhaler technique. Patient medication instruction was to continue use of levalbuterol only PRN.  -Educated patient on purpose, proper use, potential adverse effects including tachycardia.  -Reviewed results of pulmonary function tests.  Pt verbalized understanding of results and education.

## 2021-09-24 NOTE — Assessment & Plan Note (Signed)
Chronic tobacco abuse of >40 years duration - smoking ~ 1/2 ppd  or a pack year history of ~ 20 pack years.  Found to have lung age of 28 years on spirometry.  Moderate obstruction with reversibility was discussed and prioritization for tobacco cessation as key step to avoid progression was agreed upon.  Patient denies use of any tobacco cessation agents in the past.  She is willing to come back in 1 month to work on her cessation plan.  Short-term goal for the upcoming month is to cut back - patient selected target of 5 or less per day.  Focus on encouraging progress with cessation, intake reduction at this visit.

## 2021-09-30 ENCOUNTER — Encounter: Payer: Self-pay | Admitting: Gastroenterology

## 2021-10-22 ENCOUNTER — Other Ambulatory Visit: Payer: Self-pay | Admitting: Family Medicine

## 2021-10-24 ENCOUNTER — Encounter: Payer: Self-pay | Admitting: Pharmacist

## 2021-10-24 ENCOUNTER — Ambulatory Visit (INDEPENDENT_AMBULATORY_CARE_PROVIDER_SITE_OTHER): Payer: 59 | Admitting: Pharmacist

## 2021-10-24 DIAGNOSIS — J449 Chronic obstructive pulmonary disease, unspecified: Secondary | ICD-10-CM | POA: Diagnosis not present

## 2021-10-24 DIAGNOSIS — E785 Hyperlipidemia, unspecified: Secondary | ICD-10-CM | POA: Diagnosis not present

## 2021-10-24 DIAGNOSIS — I609 Nontraumatic subarachnoid hemorrhage, unspecified: Secondary | ICD-10-CM | POA: Diagnosis not present

## 2021-10-24 DIAGNOSIS — I10 Essential (primary) hypertension: Secondary | ICD-10-CM

## 2021-10-24 DIAGNOSIS — Z720 Tobacco use: Secondary | ICD-10-CM | POA: Diagnosis not present

## 2021-10-24 MED ORDER — DILTIAZEM HCL ER COATED BEADS 120 MG PO CP24: 120.0000 mg | ORAL_CAPSULE | Freq: Every day | ORAL | 3 refills | Status: DC

## 2021-10-24 MED ORDER — ROSUVASTATIN CALCIUM 40 MG PO TABS: 40.0000 mg | ORAL_TABLET | Freq: Every day | ORAL | 3 refills | Status: DC

## 2021-10-24 MED ORDER — TRELEGY ELLIPTA 100-62.5-25 MCG/ACT IN AEPB: 1.0000 | INHALATION_SPRAY | Freq: Every day | RESPIRATORY_TRACT | 0 refills | Status: DC

## 2021-10-24 NOTE — Progress Notes (Signed)
S:   Chief Complaint  Patient presents with   Medication Management    Tobacco cessation   [name] is a 61 y.o. female who presents for evaluation/assistance with tobacco dependence.  PMH is significant for HLD, HTN, COPD.  Patient was referred and last seen by Primary Care Provider, Dr. Arby Barrette, on 08/29/2021.   Age when started using tobacco on a daily basis at age 9, it increased a lot when she was about 66. Brand smoked [place] Light 100. Number of cigarettes/day 8, down from 10/day since last visit. Estimated nicotine content per cigarette (mg) ~ 1 mg Estimated nicotine intake per day < '10mg'$ .   Smokes 3 times per night.    Fagerstrom Score Question Scoring Patient Score  How soon after waking do you smoke your first cigarette? <5 mins (3) 3  Do you find it difficult NOT to smoke in places where you shouldn't? No (0) 0  Which cigarette would you most hate to give up? First one in AM (1) 1   How many cigarettes do you smoke/day? 10 or less (0) 0  Do you smoke more during the first few hours after waking? No (0) 0  Do you smoke if you are so ill you cannot get out of bed? Yes (1) No (0) 1    Total Score   5  Score interpretation: low 1-2, low-to-moderate 3-4, moderate 5-7, high >7  Most recent quit attempt, 2010 before this current attempt Longest time ever been tobacco free 3 months in 2010.  Medications used in past cessation efforts include: none, only nicotine gum  Rates IMPORTANCE of quitting tobacco on 1-10 scale of 10. Rates CONFIDENCE of quitting tobacco on 1-10 scale of 5. Only 5 because she feels it is a hard habit to break, getting through withdrawal and craving from nicotine is very hard.  Most common triggers to use tobacco include; stress is a trigger, when someone makes her upset she has to defuse by having a cigarette   Motivation to quit: Wants to quit for her health, it is expensive, her grandkids want her to quit, you want your house and car  to smell better, breath to smell better.   O: Clinical ASCVD: No  The 10-year ASCVD risk score (Arnett DK, et al., 2019) is: 34%   Values used to calculate the score:     Age: 72 years     Sex: Female     Is Non-Hispanic African American: Yes     Diabetic: No     Tobacco smoker: Yes     Systolic Blood Pressure: 967 mmHg     Is BP treated: Yes     HDL Cholesterol: 38 mg/dL     Total Cholesterol: 286 mg/dL  ROS  Physical Exam Vitals reviewed.  Pulmonary:     Effort: Pulmonary effort is normal.     Breath sounds: Normal breath sounds.  Neurological:     Mental Status: Mental status is at baseline.  Psychiatric:        Mood and Affect: Mood normal.        Thought Content: Thought content normal.      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    -Continued nicotine replacement tx with nicotine gum '4mg'$  and decreasing dose to 2 mg by cutting pieces in half to  minimize  GI upset. Patient also counseled on purpose, proper use, and potential adverse effects, including park and chew method. Since this is a holiday weekend, feels that she can make good progress next week on quitting. Discussed goal of getting to 6 cigarettes per day by the end of next week and 4 cigarettes per day by the end of the following week.      COPD with asthma - reports continued dyspnea despite tobacco intake reduction.  She reports that her supply of her inhaler (obtained from a family member) is about to run out.  She is only using albuterol PRN and taking it only a few days per week.  - Initiate Trelegy Ellipta 11mg fluticasone/vilaterol/umedlicdinium once daily.   - Reassess breathing at follow-up visit in 1 month.   Longstanding hyperlipidemia with repeat measures of LDL > '200mg'$ /dl.  She was taking a moderate intensity statin dose without any mylagia.   -Increased rosuvastatin (Crestor) dose from 10 mg daily to 40 mg daily based on history of high LDL, needs to be on high intensity statin.    History of hypertension, taking spironolactone and diltiazem.  Today blood pressure was elevated systolic with wide pulse pressure.  Refilled diltiazem at patient request.  Consider change in therapy at next visit.   Written patient instructions provided. Patient verbalized understanding of treatment plan.  Total time in face to face counseling 45 minutes.    Follow-up:  Pharmacist Dr. KValentina Lucks PCP clinic visit with Dr. PArby Barrettenot scheduled yet.  Patient seen with EMartina Sinner PharmD Candidate and KTitus Dubin PharmD PGY-1 Resident.

## 2021-10-24 NOTE — Patient Instructions (Addendum)
It was nice to see you today! Congratulations on getting down to 8 cigarettes per day. Goals discussed today: 6 cigarettes per day by the end of next week, 4 cigarettes per day by the end of the following week. Decrease nicotine gum from 4 mg to 2 mg Start Trelegy once daily to help with breathing

## 2021-10-25 NOTE — Assessment & Plan Note (Signed)
Longstanding hyperlipidemia with repeat measures of LDL > '200mg'$ /dl.  She was taking a moderate intensity statin dose without any mylagia.  -Increased rosuvastatin (Crestor) dose from 10 mg daily to 40 mg daily based on history of high LDL, needs to be on high intensity statin.

## 2021-10-25 NOTE — Assessment & Plan Note (Signed)
COPD with asthma - reports continued dyspnea despite tobacco intake reduction.  She reports that her supply of her inhaler (obtained from a family member) is about to run out.  She is only using albuterol PRN and taking it only a few days per week.  - Initiate Trelegy Ellipta 112mg fluticasone/vilaterol/umedlicdinium once daily.   - Reassess breathing at follow-up visit in 1 month.

## 2021-10-25 NOTE — Assessment & Plan Note (Signed)
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    -Continued nicotine replacement tx with nicotine gum '4mg'$  and decreasing dose to 2 mg by cutting pieces in half to minimize  GI upset. Patient also counseled on purpose, proper use, and potential adverse effects, including park and chew method. Since this is a holiday weekend, feels that she can make good progress next week on quitting. Discussed goal of getting to 6 cigarettes per day by the end of next week and 4 cigarettes per day by the end of the following week.

## 2021-10-25 NOTE — Assessment & Plan Note (Signed)
Refilled diltiazem 

## 2021-10-29 NOTE — Progress Notes (Signed)
Noted and agree. 

## 2021-11-21 ENCOUNTER — Encounter: Payer: Self-pay | Admitting: Pharmacist

## 2021-11-21 ENCOUNTER — Ambulatory Visit (INDEPENDENT_AMBULATORY_CARE_PROVIDER_SITE_OTHER): Payer: 59 | Admitting: Pharmacist

## 2021-11-21 VITALS — BP 161/69 | HR 72 | Wt 189.6 lb

## 2021-11-21 DIAGNOSIS — J449 Chronic obstructive pulmonary disease, unspecified: Secondary | ICD-10-CM

## 2021-11-21 DIAGNOSIS — Z720 Tobacco use: Secondary | ICD-10-CM

## 2021-11-21 DIAGNOSIS — I10 Essential (primary) hypertension: Secondary | ICD-10-CM | POA: Diagnosis not present

## 2021-11-21 MED ORDER — DILTIAZEM HCL ER COATED BEADS 120 MG PO CP24: 120.0000 mg | ORAL_CAPSULE | Freq: Every day | ORAL | 3 refills | Status: DC

## 2021-11-21 NOTE — Progress Notes (Signed)
S:   Chief Complaint  Patient presents with   Medication Management    Tobacco Cessation F/U - Breathing   [name] is a 61 y.o. female who presents for evaluation/assistance with tobacco dependence.  PMH is significant for hypertension, tobacco use, HLD, COPD with asthma.  Patient was referred and last seen by Primary Care Provider, Dr. Arby Barrette, on 08/29/2021.   At last visit, decreased nicotine gum from 4 mg to 2 mg.   Age when started using tobacco on a daily basis at age 21, it increased a lot when she was about age 14. Brand smoked [place] Light 100. Number of cigarettes/day 6, down from 8/day at last visit.  Estimated nicotine content per cigarette (mg) ~1 mg.  Estimated nicotine intake per day '10mg'$ .   Denies waking to smoke  Fagerstrom Score Question Scoring Patient Score  How soon after waking do you smoke your first cigarette? <5 mins (3) 5-30 mins (2) 31-60 mins (1) >60 mins (0) 2  Do you find it difficult NOT to smoke in places where you shouldn't? Yes(1) No (0) 0  Which cigarette would you most hate to give up? First one in AM (1) Any other one (0) 1   How many cigarettes do you smoke/day? 10 or less (0) 11-20 (1) 21-30 (2) >30 (3) 0  Do you smoke more during the first few hours after waking? Yes (1) No (0) 0  Do you smoke if you are so ill you cannot get out of bed? Yes (1) No (0) 1    Total Score   4  Score interpretation: low 1-2, low-to-moderate 3-4, moderate 5-7, high >7  Most recent quit attempt 2010 before this current attempt Longest time ever been tobacco free 3 months in 2010.  Medications used in past cessation efforts include: nicotine gum. She is no longer using nicotine gum and uses life saver candies.  Most common triggers to use tobacco include stress   Motivation to quit: Wants to quit for her health, it is expensive, and wants her house and car to smell better.  Patient reports she is quitting cigarettes with her sister. They  are going to save the money they would have used on cigarettes and take their families to the beach. She set a quit date of one week before Thanksgiving. She plans to be down to 3-4 cigarettes/day at next visit in about a month.   O: Clinical ASCVD: No  The 10-year ASCVD risk score (Arnett DK, et al., 2019) is: 39.1%   Values used to calculate the score:     Age: 71 years     Sex: Female     Is Non-Hispanic African American: Yes     Diabetic: No     Tobacco smoker: Yes     Systolic Blood Pressure: 097 mmHg     Is BP treated: Yes     HDL Cholesterol: 38 mg/dL     Total Cholesterol: 286 mg/dL  Review of Systems  All other systems reviewed and are negative.  Physical Exam Constitutional:      Appearance: Normal appearance.  Psychiatric:        Mood and Affect: Mood normal.        Behavior: Behavior normal.        Thought Content: Thought content normal.   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 She has been slowly decreasing cigarettes per day.      COPD with asthma - reports dyspnea and taking Trelegy Ellipta daily. Patient reports she ran out of Trelegy Ellipta 100 mcg fluticasone/vilaterol/umedicdinium this morning. Provided samples to patient.   Medication Samples have been provided to the patient.  Drug name: Trelegy (umeclidinium/fluticasone.vilanterol)        Strength: 62.24mg/100mcg/25mcg        Qty: 2  LOT: [id number] Exp.Date: 03/27/2023  Drug name: Trelegy (umeclidinium/fluticasone.vilanterol)        Strength: 62.556m/100mcg/25mcg        Qty: 2  LOT: [id number] Exp.Date: 2/29/2025  The patient has been instructed regarding the correct time, dose, and frequency of taking this medication, including desired effects and most common side effects.   Longstanding hypertension taking spironolactone and diltiazem.  Blood pressure  elevated today. Patient reports she ran out of diltiazem about a week ago. Ordered refills of diltiazem and encouraged patient to pick up today. Consider change of therapy at next visit if BP continues to be elevated.   Leg cramping Patient complained of leg cramping on 11/16/2021 requiring EMS intervention. Ordered BMET.   Written patient instructions provided. Patient verbalized understanding of treatment plan.  Total time in face to face counseling 45 minutes.    Follow-up:  Pharmacist 1 month. Patient seen with KaJeneen RinksPharmD PGY-1 Resident.

## 2021-11-21 NOTE — Assessment & Plan Note (Signed)
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 She has been slowly decreasing cigarettes per day.

## 2021-11-21 NOTE — Assessment & Plan Note (Signed)
Longstanding hypertension taking spironolactone and diltiazem.  Blood pressure elevated today. Patient reports she ran out of diltiazem about a week ago. Ordered refills of diltiazem and encouraged patient to pick up today. Consider change of therapy at next visit if BP continues to be elevated.   Leg cramping Patient complained of leg cramping on 11/16/2021 requiring EMS intervention. Ordered BMET.

## 2021-11-21 NOTE — Patient Instructions (Signed)
GREAT to see you Today.   Keep up the work on your progress to complete tobacco cessation.   Continue Trelegy - 1 inhalation daily.   I look forward seeing you again in early November.

## 2021-11-21 NOTE — Assessment & Plan Note (Signed)
COPD with asthma - reports dyspnea and taking Trelegy Ellipta daily. Patient reports she ran out of Trelegy Ellipta 100 mcg fluticasone/vilaterol/umedicdinium this morning. Provided samples to patient.

## 2021-11-22 LAB — BASIC METABOLIC PANEL
BUN/Creatinine Ratio: 13 (ref 12–28)
BUN: 11 mg/dL (ref 8–27)
CO2: 19 mmol/L — ABNORMAL LOW (ref 20–29)
Calcium: 10.2 mg/dL (ref 8.7–10.3)
Chloride: 102 mmol/L (ref 96–106)
Creatinine, Ser: 0.88 mg/dL (ref 0.57–1.00)
Glucose: 89 mg/dL (ref 70–99)
Potassium: 4.1 mmol/L (ref 3.5–5.2)
Sodium: 143 mmol/L (ref 134–144)
eGFR: 74 mL/min/{1.73_m2} (ref >59)

## 2021-11-22 NOTE — Progress Notes (Signed)
Reviewed: I agree with Dr. Koval's documentation and management. 

## 2021-11-25 ENCOUNTER — Telehealth: Payer: Self-pay | Admitting: Pharmacist

## 2021-11-25 NOTE — Telephone Encounter (Signed)
Phone follow-up of normal BMET.   Left HIPAA compliant message RE normal lab work.  No need for follow-up.  I plan to see her again in ~ 5 weeks.

## 2021-11-25 NOTE — Telephone Encounter (Signed)
-----   Message from Lenoria Chime, MD sent at 11/22/2021  3:54 PM EDT -----  ----- Message ----- From: Lavone Neri Lab Results In Sent: 11/22/2021   3:35 PM EDT To: Zenia Resides, MD

## 2021-12-30 ENCOUNTER — Encounter: Payer: Self-pay | Admitting: Pharmacist

## 2021-12-30 ENCOUNTER — Ambulatory Visit (INDEPENDENT_AMBULATORY_CARE_PROVIDER_SITE_OTHER): Payer: 59 | Admitting: Pharmacist

## 2021-12-30 VITALS — BP 148/60 | HR 73 | Wt 189.2 lb

## 2021-12-30 DIAGNOSIS — I10 Essential (primary) hypertension: Secondary | ICD-10-CM | POA: Diagnosis not present

## 2021-12-30 DIAGNOSIS — J4489 Other specified chronic obstructive pulmonary disease: Secondary | ICD-10-CM | POA: Diagnosis not present

## 2021-12-30 DIAGNOSIS — Z720 Tobacco use: Secondary | ICD-10-CM | POA: Diagnosis not present

## 2021-12-30 MED ORDER — TRELEGY ELLIPTA 100-62.5-25 MCG/ACT IN AEPB: 1.0000 | INHALATION_SPRAY | Freq: Every day | RESPIRATORY_TRACT | 0 refills | Status: DC

## 2021-12-30 NOTE — Patient Instructions (Addendum)
It was great to see you today!  By the next time we see you, do your best to reduce your smoking to 2 or less cigarettes per day. You can try using sugar free gum to reduce your cravings.  Your blood pressure today was slightly above goal. Continue checking your blood pressure at home and record the readings. Your blood pressure goal is less than 140/80.  Do your best to increase physical activity and reduce sodium intake to help lower your blood pressure. If your blood pressure is still elevated next time, we may start a low dose blood pressure medication.  We will see you next month on Thursday, November 30th at 3:30 PM.

## 2021-12-30 NOTE — Assessment & Plan Note (Signed)
HTN: Currently uncontrolled and stable above goal <140/80 given consistently low DBP and reported orthostatic hypotension. Adherent to diltiazem and spironolactone. Counseled on lifestyle interventions to reduce BP today including increasing physical activity (walking at work), limiting sodium intake, and continued effort to stop smoking. If BP remains elevated at next appt, start low dose ARB such as losartan 25 mg daily. Per cardiology note from 2021 avoiding thiazides due to potential HCOM and pt developed cough on lisinopril. Continue to monitor SCr and K every 6 mo.

## 2021-12-30 NOTE — Progress Notes (Signed)
S:   Chief Complaint  Patient presents with   Medication Management    Smoking cessation   [name] is a 61 y.o. female who presents for evaluation/assistance with tobacco dependence.  PMH is significant for tobacco use disorder, COPD with asthma, HTN, HLD.  Patient was referred and last seen by Primary Care Provider, Dr. Arby Barrette, on 08/29/2021, last seen by PharnD on 11/21/2021.   At last visit, encouraged continued reduction in smoking with goal of  3-4 cigarettes/day, continued Trelegy Ellipta daily and provided samples.   Today, pt reports reducing smoking to 5 cigarettes/day (in the car to work, after meals, before bed). Has 25 min drive to work, smokes 1 cigarette during that time. Not buying packs of cigarettes, but using her daughter's supply. No longer using lifesaver candies to curb cravings due to weight gain. Nicotine gum made her nauseous in the past. Sense of smell is more sensitive to smoke now, and taste buds are waking up. Reports that breathing is worse after smoking (tight feeling). Has 6 days left of Trelegy Ellipta samples. Hardest cigarette to quit is after meals (lunch and dinner)- not eating with other smokers  Home BP monitoring: SBP 160-170, lowest 142. DBP ~70. Checks occasionally. Reports some dizziness when moving from lying to standing. No missed doses of HTN meds in past two weeks. Started taking medications in AM. 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  Age when started using tobacco on a daily basis at age 68, it increased a lot when she was about age 84. Brand smoked Newport Light 100. Number of cigarettes/day 6, down from 8/day at last visit.  Estimated nicotine content per cigarette (mg) ~1 mg.  Estimated nicotine intake per day '10mg'$ .   Denies waking to smoke    Fagerstrom Score Question Scoring Patient Score  How soon after waking do you smoke your first cigarette? <5 mins (3) 5-30 mins (2) 31-60 mins  (1) >60 mins (0) 1  Do you find it difficult NOT to smoke in places where you shouldn't? Yes(1) No (0) 0  Which cigarette would you most hate to give up? First one in AM (1) Any other one (0) 1   How many cigarettes do you smoke/day? 10 or less (0) 11-20 (1) 21-30 (2) >30 (3) 0  Do you smoke more during the first few hours after waking? Yes (1) No (0) 0  Do you smoke if you are so ill you cannot get out of bed? Yes (1) No (0) 1    Total Score  3   Score interpretation: low 1-2, low-to-moderate 3-4, moderate 5-7, high >7  Most recent quit attempt 2010 before this current attempt Longest time ever been tobacco free 3 months in 2010.  Medications used in past cessation efforts include: nicotine gum. She is no longer using nicotine gum and uses life saver candies, though has tried to cut back on candies to avoid weight gain.  Most common triggers to use tobacco include stress    Motivation to quit: Wants to quit for her health, it is expensive, and wants her house and car to smell better.  Patient reports she is quitting cigarettes with her sister. They are going to save the money they would have used on cigarettes and take their families to the beach. She set a quit date of one week before Thanksgiving. She plans to be down to 3-4 cigarettes/day at next visit in about a month.  O: Clinical ASCVD: No  The 10-year ASCVD risk score (Arnett DK, et al., 2019) is: 39.1%   Values used to calculate the score:     Age: 16 years     Sex: Female     Is Non-Hispanic African American: Yes     Diabetic: No     Tobacco smoker: Yes     Systolic Blood Pressure: 427 mmHg     Is BP treated: Yes     HDL Cholesterol: 38 mg/dL     Total Cholesterol: 286 mg/dL  Review of Systems  All other systems reviewed and are negative.   Physical Exam Constitutional:      Appearance: Normal appearance.  Pulmonary:     Effort: Pulmonary effort is normal.  Neurological:     Mental Status: She is  alert.  Psychiatric:        Mood and Affect: Mood normal.        Behavior: Behavior normal.        Thought Content: Thought content normal.        Judgment: Judgment normal.     A/P: Tobacco use disorder with severe nicotine dependence of 40 years duration in a patient who is excellent candidate for success because of high motivation to quite and slow but steady reduction in daily nicotine use.  Set goal of reducing smoking to 2 or less cigarettes per day by next appt, starting with eliminating post-lunch cigarette.  Suggested use of sugar free gum rather than life saver candies to curb craving, as pt did not tolerate NRT. Suggested cleaning out car to reduce desire to smoke in car.  COPD with asthma: Symptoms controlled on Trelegy Ellipta (fluticasone/umeclidinium/vilanterol) 1 puff once daily. Provided additional samples in clinic today. Continue Trelegy Ellipta 1 inhalation once daily  HTN: Currently uncontrolled and stable above goal <140/80 given consistently low DBP and reported orthostatic hypotension. Adherent to diltiazem and spironolactone. Counseled on lifestyle interventions to reduce BP today including increasing physical activity (walking at work), limiting sodium intake, and continued effort to stop smoking. If BP remains elevated at next appt, start low dose ARB such as losartan 25 mg daily. Per cardiology note from 2021 avoiding thiazides due to potential HCOM and pt developed cough on lisinopril. Continue to monitor SCr and K every 6 mo.  Written patient instructions provided. Patient verbalized understanding of treatment plan.  Total time in face to face counseling 25 minutes.    Follow-up:  Pharmacist 11/30 at 3:30PM.  Patient seen with Geraldo Docker, PharmD Candidate and Park Liter, PharmD Candidate.  Marland Kitchen

## 2021-12-30 NOTE — Assessment & Plan Note (Signed)
Tobacco use disorder with severe nicotine dependence of 40 years duration in a patient who is excellent candidate for success because of high motivation to quite and slow but steady reduction in daily nicotine use.  Set goal of reducing smoking to 2 or less cigarettes per day by next appt, starting with eliminating post-lunch cigarette.  Suggested use of sugar free gum rather than life saver candies to curb craving, as pt did not tolerate NRT. Suggested cleaning out car to reduce desire to smoke in car.

## 2022-01-23 ENCOUNTER — Ambulatory Visit (INDEPENDENT_AMBULATORY_CARE_PROVIDER_SITE_OTHER): Payer: 59 | Admitting: Pharmacist

## 2022-01-23 ENCOUNTER — Encounter: Payer: Self-pay | Admitting: Pharmacist

## 2022-01-23 VITALS — BP 116/63 | HR 75 | Wt 189.4 lb

## 2022-01-23 DIAGNOSIS — Z720 Tobacco use: Secondary | ICD-10-CM

## 2022-01-23 DIAGNOSIS — J4489 Other specified chronic obstructive pulmonary disease: Secondary | ICD-10-CM

## 2022-01-23 DIAGNOSIS — E785 Hyperlipidemia, unspecified: Secondary | ICD-10-CM

## 2022-01-23 MED ORDER — TRELEGY ELLIPTA 100-62.5-25 MCG/ACT IN AEPB: 1.0000 | INHALATION_SPRAY | Freq: Every day | RESPIRATORY_TRACT | 0 refills | Status: DC

## 2022-01-23 NOTE — Progress Notes (Signed)
Reviewed: I agree with Dr. Koval's documentation and management. 

## 2022-01-23 NOTE — Assessment & Plan Note (Signed)
HLD: Pt on high intensity statin, but reporting dizziness after evening dose of rosuvastatin. Last LDL-C of 200 mg/dL uncontrolled above goal <70 mg/dL, but this lipid panel was drawn prior to starting treatment. Reduce dose of rosuvastatin to 20 mg daily by taking 1/2 pill. If dizziness does not resolve, try a few days off of rosuvastatin. Recheck lipid panel at next PCP visit in January 2024.

## 2022-01-23 NOTE — Assessment & Plan Note (Signed)
Tobacco use disorder with severe nicotine dependence of 40 years duration in a patient who is excellent candidate for success because of high motivation to quit and slow but steady reduction in daily nicotine use, despite stagnation in reduction over the past month.  Set goal completely quitting smoking by next appt Suggested cleaning out car to reduce desire to smoke in car. Suggested finding distraction after meals to reduce cravings (pt has purchased lifesaver candies to use after meals)

## 2022-01-23 NOTE — Progress Notes (Signed)
S:   Chief Complaint  Patient presents with   Medication Management    Tobacco Cessation - COPD   [name] is a 61 y.o. female who presents for evaluation/assistance with tobacco dependence.  PMH is significant for tobacco use disorder, COPD with asthma, HTN, HLD.  Patient was referred and last seen by Primary Care Provider, Dr. Arby Barrette, on 08/29/2021, last seen by PharnD on 03/11/2021.   Today, pt reports she is still smoking 4 cigarettes/day. Stopped smoking the cigarette on the way to work. Smoked cigarette in the car on the way here. New quit date set for her upcoming vacation. Is going on vacation for 9 days and plans to stop smoking all cigarettes at this time. Vacation Dec 22-Jan 1. Granddaughter suggested doing squats when she has the urge to smoke. Denies breathing issues currently, Trelegy working well. Does not feel as out of breath at work. Has 5-6 days of Trelegy left. Plans to use lifesaver candies with cravings and after meals.Rates confidence in being able to stop smoking by Jan 1st, 2024 as a 10/10. Her grandson will be most excited for her quitting smoking.   Nicotine gum made her nauseous in the past. Sense of smell is more sensitive to smoke now, and taste buds are waking up. Reports that breathing is worse after smoking (tight feeling). Hardest cigarette to quit is after meals (lunch and dinner)- not eating with other smokers  Reports dizziness after she takes rosuvastatin 40 mg every day. Will wake up in the night with some lightheadedness.   Age when started using tobacco on a daily basis at age 45, it increased a lot when she was about age 73.  Brand smoked Newport Light 100. Number of cigarettes/day 6, down from 8/day at last visit.  Estimated nicotine content per cigarette (mg) ~1 mg.  Estimated nicotine intake per day '10mg'$ .   Denies waking to smoke    Fagerstrom Score Question Scoring Patient Score  How soon after waking do you smoke your first  cigarette? <5 mins (3) 5-30 mins (2) 31-60 mins (1) >60 mins (0) 1  Do you find it difficult NOT to smoke in places where you shouldn't? Yes(1) No (0) 0  Which cigarette would you most hate to give up? First one in AM (1) Any other one (0) 1   How many cigarettes do you smoke/day? 10 or less (0) 11-20 (1) 21-30 (2) >30 (3) 0  Do you smoke more during the first few hours after waking? Yes (1) No (0) 0  Do you smoke if you are so ill you cannot get out of bed? Yes (1) No (0) 1    Total Score  3   Score interpretation: low 1-2, low-to-moderate 3-4, moderate 5-7, high >7  Most recent quit attempt 2010 before this current attempt Longest time ever been tobacco free 3 months in 2010.  Medications used in past cessation efforts include: nicotine gum. She is no longer using nicotine gum and uses life saver candies, though has tried to cut back on candies to avoid weight gain.  Most common triggers to use tobacco include stress    Motivation to quit: Wants to quit for her health, it is expensive, and wants her house and car to smell better. Wants to be around for her future grandchildren and nieces and nephews. Does not want to need an oxygen tank.  O: Clinical ASCVD: No  The 10-year ASCVD risk score (Arnett DK, et al., 2019) is:  32.5%   Values used to calculate the score:     Age: 28 years     Sex: Female     Is Non-Hispanic African American: Yes     Diabetic: No     Tobacco smoker: Yes     Systolic Blood Pressure: 628 mmHg     Is BP treated: Yes     HDL Cholesterol: 38 mg/dL     Total Cholesterol: 286 mg/dL  Review of Systems  All other systems reviewed and are negative.   Physical Exam Constitutional:      Appearance: Normal appearance.  Pulmonary:     Effort: Pulmonary effort is normal.  Neurological:     Mental Status: She is alert.  Psychiatric:        Mood and Affect: Mood normal.        Behavior: Behavior normal.        Thought Content: Thought content  normal.        Judgment: Judgment normal.     A/P: Tobacco use disorder with severe nicotine dependence of 40 years duration in a patient who is excellent candidate for success because of high motivation to quit and slow but steady reduction in daily nicotine use, despite stagnation in reduction over the past month.  Set goal completely quitting smoking by next appt Suggested cleaning out car to reduce desire to smoke in car. Suggested finding distraction after meals to reduce cravings (pt has purchased lifesaver candies to use after meals)  COPD with asthma: Symptoms controlled on Trelegy Ellipta (fluticasone/umeclidinium/vilanterol) 1 puff once daily. Provided additional samples in clinic today.  Continue Trelegy Ellipta 1 inhalation once daily  HTN: Currently controlled and stable below goal <140/80 given consistently low DBP and reported orthostatic hypotension. Adherent to diltiazem and spironolactone. This BP is much improved from previous in office readings. Continue to monitor. If BP is elevated at future appt, consider low dose ARB such as losartan 25 mg daily. Per cardiology note from 2021 avoiding thiazides due to potential HCOM and pt developed cough on lisinopril.  Continue diltiazem 120 mg daily Continue spironolactone 50 mg daile  HLD: Pt on high intensity statin, but reporting dizziness after evening dose of rosuvastatin. Last LDL-C of 200 mg/dL uncontrolled above goal <70 mg/dL, but this lipid panel was drawn prior to starting treatment. Reduce dose of rosuvastatin to 20 mg daily by taking 1/2 pill. If dizziness does not resolve, try a few days off of rosuvastatin. Recheck lipid panel at next PCP visit in January 2024.   Written patient instructions provided. Patient verbalized understanding of treatment plan.  Total time in face to face counseling 25 minutes.    Follow-up:  PCP 03/11/22 at 3:30PM. Will f/u with smoking cessation during this visit.   Patient seen with:  Park Liter,  PharmD Candidate.and Joseph Art, PharmD, PGY2 Pharmacy Resident.

## 2022-01-23 NOTE — Assessment & Plan Note (Signed)
COPD with asthma: Symptoms controlled on Trelegy Ellipta (fluticasone/umeclidinium/vilanterol) 1 puff once daily. Provided additional samples in clinic today.  Continue Trelegy Ellipta 1 inhalation once daily

## 2022-01-23 NOTE — Patient Instructions (Addendum)
It was great to see you today! Have a great Christmas!  We look forward to seeing you in January at your visit with Dr. Arby Barrette.   Do your best to quit smoking ALL cigarettes between December 22nd and January 1st  Continue taking Trelegy one puff once daily. Rinse your mouth after each dose.

## 2022-02-13 ENCOUNTER — Ambulatory Visit (INDEPENDENT_AMBULATORY_CARE_PROVIDER_SITE_OTHER): Payer: Managed Care, Other (non HMO) | Admitting: Family Medicine

## 2022-02-13 ENCOUNTER — Telehealth: Payer: Self-pay

## 2022-02-13 VITALS — BP 117/80 | HR 85 | Temp 98.6°F | Wt 188.0 lb

## 2022-02-13 DIAGNOSIS — M436 Torticollis: Secondary | ICD-10-CM | POA: Diagnosis not present

## 2022-02-13 DIAGNOSIS — J069 Acute upper respiratory infection, unspecified: Secondary | ICD-10-CM | POA: Insufficient documentation

## 2022-02-13 NOTE — Telephone Encounter (Signed)
Patient calls nurse line reporting flu like symptoms.   She reports symptoms started on Monday with fevers, body aches, nausea, vomiting and diarrhea.   She reports her daughter tested positive for the flu yesterday. She reports close contact with her on Friday and Saturday.   Patient has tried Coricidin with no relief.   Patient reports she needs a doctors note for work.   Conservative measures given and patient scheduled for this afternoon for evaluation.

## 2022-02-13 NOTE — Progress Notes (Signed)
    SUBJECTIVE:   CHIEF COMPLAINT / HPI:   [name] is a 61 y.o. female who presents to the [facility] clinic today to discuss the following concerns:   Flu Like Symptoms Symptoms started on Monday 12/18 with fevers, body aches, nausea, vomiting, and diarrhea.  Her daughter tested positive for the flu yesterday and she was in close contact with her on 12/15 and 12/16.  She has tried Coricidin without relief. States that this is the first day that she has not had a temperature. Her Tmax was 102.3 on Tuesday. She states that she has had a yellow productive cough.   She is down to 4-5 cigarettes/day.  She states that she cannot get her flu shot because of an egg allergy. States that she is COVID vaccine.   Neck Pain She has some discomfort to the left lateral aspect of her neck/left upper back.  Hurts worse when she turns her head to the left.  She has not tried a heating pad for this.  PERTINENT  PMH / PSH: SAH, HLD, tobacco use disorder  OBJECTIVE:   BP 117/80   Pulse 85   Temp 98.6 F (37 C)   Wt 188 lb (85.3 kg)   SpO2 96%   BMI 31.77 kg/m    General: NAD, pleasant, able to participate in exam HEENT: Normocephalic, TM with good light reflux b/l, non-bulging, nares patent, oropharynx clear, no tonsillar erythema or exudates Neck: Supple but with some discomfort with left rotation. Ttp left upper trapezius and cervical muscles. Hypertonicity of left upper trap Cardiac: RRR, holosystolic murmur present. Respiratory: CTAB, normal effort.  Rhonchi at bilateral bases Psych: Normal affect and mood  ASSESSMENT/PLAN:   Viral URI with cough Had contact with family members who tested positive for the flu but out of the window for flu treatment, therefore deferred testing today.  Symptoms seem to be improving with conservative measures, advised her to continue this.  Return precautions discussed.  Torticollis History and examination seem most consistent with torticollis. She had relief  of symptoms with soft tissue massage. Doubt any more serious pathology such as meningitis.  Encouraged heating pads, can use topical Voltaren gel if needed.  Offered short course of muscle relaxer but she is not wanting any medications at this time.     Sharion Settler, [place]

## 2022-02-13 NOTE — Assessment & Plan Note (Signed)
Had contact with family members who tested positive for the flu but out of the window for flu treatment, therefore deferred testing today.  Symptoms seem to be improving with conservative measures, advised her to continue this.  Return precautions discussed.

## 2022-02-13 NOTE — Patient Instructions (Signed)
It was wonderful to see you today.  Please bring ALL of your medications with you to every visit.   Today we talked about:  **  Thank you for coming to your visit as scheduled. We have had a large "no-show" problem lately, and this significantly limits our ability to see and care for patients. As a friendly reminder- if you cannot make your appointment please call to cancel. We do have a no show policy for those who do not cancel within 24 hours. Our policy is that if you miss or fail to cancel an appointment within 24 hours, 3 times in a 6-month period, you may be dismissed from our clinic.   Thank you for choosing [facility].   Please call [phone] with any questions about today's appointment.  Please be sure to schedule follow up at the front  desk before you leave today.   [place], DO PGY-3 Family Medicine   

## 2022-02-13 NOTE — Assessment & Plan Note (Addendum)
History and examination seem most consistent with torticollis. She had relief of symptoms with soft tissue massage. Doubt any more serious pathology such as meningitis.  Encouraged heating pads, can use topical Voltaren gel if needed.  Offered short course of muscle relaxer but she is not wanting any medications at this time.

## 2022-03-06 ENCOUNTER — Ambulatory Visit: Payer: 59 | Admitting: Pharmacist

## 2022-03-11 ENCOUNTER — Encounter: Payer: Self-pay | Admitting: Family Medicine

## 2022-03-11 ENCOUNTER — Ambulatory Visit (INDEPENDENT_AMBULATORY_CARE_PROVIDER_SITE_OTHER): Payer: Managed Care, Other (non HMO) | Admitting: Family Medicine

## 2022-03-11 VITALS — BP 140/80 | HR 84 | Temp 98.3°F | Ht 65.0 in | Wt 185.2 lb

## 2022-03-11 DIAGNOSIS — E669 Obesity, unspecified: Secondary | ICD-10-CM | POA: Diagnosis not present

## 2022-03-11 DIAGNOSIS — Z720 Tobacco use: Secondary | ICD-10-CM

## 2022-03-11 DIAGNOSIS — J4489 Other specified chronic obstructive pulmonary disease: Secondary | ICD-10-CM

## 2022-03-11 DIAGNOSIS — I10 Essential (primary) hypertension: Secondary | ICD-10-CM

## 2022-03-11 DIAGNOSIS — E8881 Metabolic syndrome: Secondary | ICD-10-CM | POA: Diagnosis not present

## 2022-03-11 LAB — POCT GLYCOSYLATED HEMOGLOBIN (HGB A1C): Hemoglobin A1C: 5.6 % (ref 4.0–5.6)

## 2022-03-11 MED ORDER — TRELEGY ELLIPTA 100-62.5-25 MCG/ACT IN AEPB: 1.0000 | INHALATION_SPRAY | Freq: Every day | RESPIRATORY_TRACT | 5 refills | Status: DC

## 2022-03-11 MED ORDER — SEMAGLUTIDE(0.25 OR 0.5MG/DOS) 2 MG/1.5ML ~~LOC~~ SOPN: 0.2500 mg | PEN_INJECTOR | SUBCUTANEOUS | 0 refills | Status: DC

## 2022-03-11 MED ORDER — VARENICLINE TARTRATE 0.5 MG PO TABS: 0.5000 mg | ORAL_TABLET | Freq: Two times a day (BID) | ORAL | 2 refills | Status: DC

## 2022-03-11 NOTE — Progress Notes (Signed)
Asked by Dr. Arby Barrette to meet with patient RE tobacco cessation assistance.   Patient reports continued smoking ~ 5-6 cigs per day despite trying to use nicotine gum. Highly interested in quitting.    Although she has not taken Trelegy Ellipta for 2-3 days, I asked her to restart and continue at this time as her breathing is much improved.   New prescription provided.   Discussed varenicline. Initiated therapy - new prescription provided.   Patient educated on purpose, proper use and potential adverse effects of nausea and need to take doses with meals.  Following instruction patient verbalized understanding of treatment plan.   Follow-up in 2 weeks in pharmacy clinic for both Blood Pressure and tobacco cessation support.

## 2022-03-11 NOTE — Progress Notes (Signed)
    SUBJECTIVE:   CHIEF COMPLAINT / HPI:   Patient presents to discuss weight loss. States her weight has been fluctuating but has been lower at home than at clinic. Does recognize that clothes and different scales would alter the numbers. Has been doing the whole 30. On day 16. Has been eating only meats, vegetables and fruit. No juice or sodas. Feels less bloated. Taking probiotic   Can't seem to let go of her 5 cigarettes but wants help with quitting, open to pharmacy referral   BP: not really checking at home  Takes spironolatone '50mg'$  daily   PERTINENT  PMH / PSH: Reviewed   OBJECTIVE:   BP (!) 140/80   Pulse 84   Temp 98.3 F (36.8 C)   Ht '5\' 5"'$  (1.651 m)   Wt 185 lb 3.2 oz (84 kg)   SpO2 96%   BMI 30.82 kg/m    Physical exam General: well appearing, NAD Cardiovascular: RRR, murmur present  Lungs: CTAB. Normal WOB Abdomen: soft, non-distended Skin: warm, dry. No edema  ASSESSMENT/PLAN:   Abdominal obesity and metabolic syndrome BMI 99.35. A1c 5.6. Given HTN, HLD would definitely benefit from GLP-1 in addition to healthy eating and physical activity. Will see if insurance will approve Semaglutide .'25mg'$  weekly. Instructed on side effects and how to administer. Also advised returning to see me or pharmacy team if approved and can discuss more if needed.   Tobacco use Currently smoking 5 cigarettes a day but motivated to quit. Open to pharmacy referral- spoke to Dr. Valentina Lucks who did see patient after the visit and started her on Chantix and will continue to follow with her  Hypertension BP 140/80. Currently on Spironolatone '50mg'$  daily and Diltiazem '120mg'$  daily. Has previously followed with Cardiology. Dr. Valentina Lucks has scheduled follow up with her to discuss management further.     [place]

## 2022-03-11 NOTE — Patient Instructions (Addendum)
It was great seeing you today!  You came in to discuss weight loss medication and I have sent in for Semaglutide (Wegovy/Ozempic) to see if its approved by insurance. You will take this once weekly on the same day.  Your A1c was low at 5.6  The pharmacy team will call you about smoking cessation   Visit Reminders: - Stop by the pharmacy to pick up your prescriptions  - Continue to work on your healthy eating habits and incorporating exercise into your daily life.   Feel free to call with any questions or concerns at any time, at [phone].   Take care,  Dr. Shary Key [facility]

## 2022-03-13 DIAGNOSIS — E8881 Metabolic syndrome: Secondary | ICD-10-CM | POA: Insufficient documentation

## 2022-03-13 NOTE — Assessment & Plan Note (Signed)
BP 140/80. Currently on Spironolatone '50mg'$  daily and Diltiazem '120mg'$  daily. Has previously followed with Cardiology. Dr. Valentina Lucks has scheduled follow up with her to discuss management further.

## 2022-03-13 NOTE — Assessment & Plan Note (Signed)
Currently smoking 5 cigarettes a day but motivated to quit. Open to pharmacy referral- spoke to Dr. Valentina Lucks who did see patient after the visit and started her on Chantix and will continue to follow with her

## 2022-03-13 NOTE — Assessment & Plan Note (Addendum)
BMI 30.82. A1c 5.6. Given HTN, HLD would definitely benefit from GLP-1 in addition to healthy eating and physical activity. Will see if insurance will approve Semaglutide .'25mg'$  weekly. Instructed on side effects and how to administer. Also advised returning to see me or pharmacy team if approved and can discuss more if needed.

## 2022-03-14 NOTE — Progress Notes (Signed)
Reviewed: I agree with Dr. Graylin Shiver documentation and management.

## 2022-03-27 ENCOUNTER — Ambulatory Visit (INDEPENDENT_AMBULATORY_CARE_PROVIDER_SITE_OTHER): Payer: Managed Care, Other (non HMO) | Admitting: Pharmacist

## 2022-03-27 VITALS — Ht 64.5 in | Wt 179.0 lb

## 2022-03-27 DIAGNOSIS — J4489 Other specified chronic obstructive pulmonary disease: Secondary | ICD-10-CM | POA: Diagnosis not present

## 2022-03-27 DIAGNOSIS — Z720 Tobacco use: Secondary | ICD-10-CM | POA: Diagnosis not present

## 2022-03-27 MED ORDER — SPIRIVA RESPIMAT 2.5 MCG/ACT IN AERS: 2.0000 | INHALATION_SPRAY | Freq: Every day | RESPIRATORY_TRACT | 6 refills | Status: DC

## 2022-03-27 NOTE — Patient Instructions (Addendum)
Great to see you today!    Congratulations on cutting back to 3 cigarettes per day.   Today we changed your maintenance inhaler.  -STOP Trelegy  -START Spiriva Respimat (tiotropium) 2 puffs once daily.

## 2022-03-27 NOTE — Progress Notes (Signed)
   S:   Chief Complaint  Patient presents with   Medication Management    Tobacco Cessation, COPD/Asthma   [name] is a 62 y.o. female who presents for evaluation/assistance with tobacco dependence.  PMH is significant for COPD with reversibility (denies any previous diagnosis of asthma at any point in her life).  Patient was referred and last seen by Primary Care Provider, Dr. Arby Barrette, on 03/11/2022.   At last visit, patient was smoking ~ 6 cigs per day and was initiated on varenicline.    Rates IMPORTANCE of quitting tobacco as high. Rates CONFIDENCE of quitting tobacco for at least 3 days within the next 30 days 1-10 scale of 5.  Most common triggers to use tobacco include; habit (at work breaks/ after lunch)   Motivation to quit: Breathing  O:  Review of Systems  Musculoskeletal:        Cramps of jaw, and lower legs following reinitiation of Trelegy.    Physical Exam Constitutional:      Appearance: Normal appearance.  Pulmonary:     Effort: Pulmonary effort is normal.  Neurological:     Mental Status: She is alert.  Psychiatric:        Mood and Affect: Mood normal.        Behavior: Behavior normal.        Thought Content: Thought content normal.     A/P: Tobacco use disorder with long history of nicotine dependence.  excellent candidate for success because of progress with quitting and current level of motivation.  She has made additional progress since last visit, reporting only 3 cigarettes per day.  Denies any side effects from varenicline.    -Continue varenicline 0.5 mg by mouth twice daily with food  -Provided information on 1 800-QUIT NOW support program.   COPD with moderate muscle cramping of jaw and calf muscles bilaterally following re-initiation of Trelegy inhaler.  Possibly related to current diet in combination with beta-Agonist which may be causing hypokalemia.  - STOP  Trelegy - Initiate Spiriva (Tiotropium) Respimat 2 inhalation once daily.  Patient educated on purpose, proper use and potential adverse effects of dry mouth.  Following instruction patient verbalized understanding of treatment plan and was able to demonstrate appropriate technique with Demo.  Reassess breathing at next visit.    *NOTE - semaglutide previously prescribed was not covered by her insurance.  Removed from med list.   Written patient instructions provided. Patient verbalized understanding of treatment plan.  Total time in face to face counseling   minutes.    Follow-up:  Pharmacist 1 month - 05/01/2022. PCP clinic visit April anticipated. Patient seen with Dixon Boos, PharmD Candidate.

## 2022-03-28 ENCOUNTER — Other Ambulatory Visit (HOSPITAL_COMMUNITY): Payer: Self-pay

## 2022-03-28 ENCOUNTER — Encounter: Payer: Self-pay | Admitting: Pharmacist

## 2022-03-28 NOTE — Assessment & Plan Note (Signed)
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  excellent candidate for success because of progress with quitting and current level of motivation.  She has made additional progress since last visit, reporting only 3 cigarettes per day.  Denies any side effects from varenicline.    -Continue varenicline 0.5 mg by mouth twice daily with food  -Provided information on 1 800-QUIT NOW support program.

## 2022-03-28 NOTE — Progress Notes (Signed)
Reviewed: I agree with Dr. Koval's documentation and management. 

## 2022-03-28 NOTE — Assessment & Plan Note (Signed)
COPD with moderate muscle cramping of jaw and calf muscles bilaterally following re-initiation of Trelegy inhaler.  Possibly related to current diet in combination with beta-Agonist which may be causing hypokalemia.  - STOP  Trelegy - Initiate Spiriva (Tiotropium) Respimat 2 inhalation once daily. Patient educated on purpose, proper use and potential adverse effects of dry mouth.  Following instruction patient verbalized understanding of treatment plan and was able to demonstrate appropriate technique with Demo.  Reassess breathing at next visit.

## 2022-04-29 ENCOUNTER — Encounter (HOSPITAL_COMMUNITY): Payer: Self-pay

## 2022-04-29 ENCOUNTER — Inpatient Hospital Stay (HOSPITAL_COMMUNITY)
Admission: EM | Admit: 2022-04-29 | Discharge: 2022-04-30 | DRG: 378 | Disposition: A | Discharge status: Home / Self Care | Payer: Managed Care, Other (non HMO) | Attending: Family Medicine | Admitting: Family Medicine

## 2022-04-29 ENCOUNTER — Encounter (HOSPITAL_COMMUNITY)
Admission: EM | Disposition: A | Discharge status: Home / Self Care | Payer: Self-pay | Source: Home / Self Care | Attending: Family Medicine

## 2022-04-29 ENCOUNTER — Observation Stay (HOSPITAL_COMMUNITY): Payer: Managed Care, Other (non HMO) | Admitting: Certified Registered Nurse Anesthetist

## 2022-04-29 ENCOUNTER — Other Ambulatory Visit: Payer: Self-pay

## 2022-04-29 DIAGNOSIS — K31819 Angiodysplasia of stomach and duodenum without bleeding: Secondary | ICD-10-CM

## 2022-04-29 DIAGNOSIS — F17200 Nicotine dependence, unspecified, uncomplicated: Secondary | ICD-10-CM

## 2022-04-29 DIAGNOSIS — Q394 Esophageal web: Secondary | ICD-10-CM

## 2022-04-29 DIAGNOSIS — Z888 Allergy status to other drugs, medicaments and biological substances status: Secondary | ICD-10-CM

## 2022-04-29 DIAGNOSIS — Z83438 Family history of other disorder of lipoprotein metabolism and other lipidemia: Secondary | ICD-10-CM

## 2022-04-29 DIAGNOSIS — N390 Urinary tract infection, site not specified: Secondary | ICD-10-CM | POA: Insufficient documentation

## 2022-04-29 DIAGNOSIS — E785 Hyperlipidemia, unspecified: Secondary | ICD-10-CM | POA: Diagnosis present

## 2022-04-29 DIAGNOSIS — K31811 Angiodysplasia of stomach and duodenum with bleeding: Principal | ICD-10-CM

## 2022-04-29 DIAGNOSIS — J449 Chronic obstructive pulmonary disease, unspecified: Secondary | ICD-10-CM | POA: Diagnosis present

## 2022-04-29 DIAGNOSIS — D62 Acute posthemorrhagic anemia: Secondary | ICD-10-CM | POA: Diagnosis not present

## 2022-04-29 DIAGNOSIS — Z91013 Allergy to seafood: Secondary | ICD-10-CM

## 2022-04-29 DIAGNOSIS — I639 Cerebral infarction, unspecified: Secondary | ICD-10-CM

## 2022-04-29 DIAGNOSIS — I10 Essential (primary) hypertension: Secondary | ICD-10-CM | POA: Diagnosis present

## 2022-04-29 DIAGNOSIS — K317 Polyp of stomach and duodenum: Secondary | ICD-10-CM | POA: Diagnosis not present

## 2022-04-29 DIAGNOSIS — F17210 Nicotine dependence, cigarettes, uncomplicated: Secondary | ICD-10-CM | POA: Diagnosis present

## 2022-04-29 DIAGNOSIS — Z8719 Personal history of other diseases of the digestive system: Secondary | ICD-10-CM

## 2022-04-29 DIAGNOSIS — K921 Melena: Secondary | ICD-10-CM | POA: Diagnosis not present

## 2022-04-29 DIAGNOSIS — K920 Hematemesis: Secondary | ICD-10-CM | POA: Diagnosis not present

## 2022-04-29 DIAGNOSIS — Z91011 Allergy to milk products: Secondary | ICD-10-CM

## 2022-04-29 DIAGNOSIS — Z8673 Personal history of transient ischemic attack (TIA), and cerebral infarction without residual deficits: Secondary | ICD-10-CM

## 2022-04-29 DIAGNOSIS — Z91018 Allergy to other foods: Secondary | ICD-10-CM

## 2022-04-29 DIAGNOSIS — Z886 Allergy status to analgesic agent status: Secondary | ICD-10-CM

## 2022-04-29 DIAGNOSIS — K2970 Gastritis, unspecified, without bleeding: Secondary | ICD-10-CM

## 2022-04-29 DIAGNOSIS — Z79899 Other long term (current) drug therapy: Secondary | ICD-10-CM

## 2022-04-29 DIAGNOSIS — Z86010 Personal history of colonic polyps: Secondary | ICD-10-CM

## 2022-04-29 DIAGNOSIS — Z8249 Family history of ischemic heart disease and other diseases of the circulatory system: Secondary | ICD-10-CM

## 2022-04-29 DIAGNOSIS — Z90710 Acquired absence of both cervix and uterus: Secondary | ICD-10-CM

## 2022-04-29 DIAGNOSIS — K922 Gastrointestinal hemorrhage, unspecified: Secondary | ICD-10-CM | POA: Diagnosis present

## 2022-04-29 DIAGNOSIS — Z91012 Allergy to eggs: Secondary | ICD-10-CM

## 2022-04-29 DIAGNOSIS — K5520 Angiodysplasia of colon without hemorrhage: Secondary | ICD-10-CM

## 2022-04-29 DIAGNOSIS — Z883 Allergy status to other anti-infective agents status: Secondary | ICD-10-CM

## 2022-04-29 DIAGNOSIS — D649 Anemia, unspecified: Secondary | ICD-10-CM

## 2022-04-29 HISTORY — DX: Chronic obstructive pulmonary disease, unspecified: J44.9

## 2022-04-29 HISTORY — PX: HOT HEMOSTASIS: SHX5433

## 2022-04-29 HISTORY — PX: ESOPHAGOGASTRODUODENOSCOPY (EGD) WITH PROPOFOL: SHX5813

## 2022-04-29 HISTORY — PX: HEMOSTASIS CLIP PLACEMENT: SHX6857

## 2022-04-29 LAB — CBC
HCT: 32.1 % — ABNORMAL LOW (ref 36.0–46.0)
HCT: 26.4 % — ABNORMAL LOW (ref 36.0–46.0)
Hemoglobin: 10.6 g/dL — ABNORMAL LOW (ref 12.0–15.0)
Hemoglobin: 8.9 g/dL — ABNORMAL LOW (ref 12.0–15.0)
MCH: 28.3 pg (ref 26.0–34.0)
MCH: 28.1 pg (ref 26.0–34.0)
MCHC: 33 g/dL (ref 30.0–36.0)
MCHC: 33.7 g/dL (ref 30.0–36.0)
MCV: 85.6 fL (ref 80.0–100.0)
MCV: 83.3 fL (ref 80.0–100.0)
Platelets: 321 10*3/uL (ref 150–400)
Platelets: 282 10*3/uL (ref 150–400)
RBC: 3.75 MIL/uL — ABNORMAL LOW (ref 3.87–5.11)
RBC: 3.17 MIL/uL — ABNORMAL LOW (ref 3.87–5.11)
RDW: 14 % (ref 11.5–15.5)
RDW: 13.9 % (ref 11.5–15.5)
WBC: 10.6 10*3/uL — ABNORMAL HIGH (ref 4.0–10.5)
WBC: 10.6 10*3/uL — ABNORMAL HIGH (ref 4.0–10.5)
nRBC: 0 % (ref 0.0–0.2)
nRBC: 0 % (ref 0.0–0.2)

## 2022-04-29 LAB — COMPREHENSIVE METABOLIC PANEL
ALT: 13 U/L (ref 0–44)
AST: 19 U/L (ref 15–41)
Albumin: 3.5 g/dL (ref 3.5–5.0)
Alkaline Phosphatase: 65 U/L (ref 38–126)
Anion gap: 10 (ref 5–15)
BUN: 21 mg/dL (ref 8–23)
CO2: 21 mmol/L — ABNORMAL LOW (ref 22–32)
Calcium: 9 mg/dL (ref 8.9–10.3)
Chloride: 107 mmol/L (ref 98–111)
Creatinine, Ser: 0.81 mg/dL (ref 0.44–1.00)
GFR, Estimated: >60 mL/min (ref >60)
Glucose, Bld: 132 mg/dL — ABNORMAL HIGH (ref 70–99)
Potassium: 3.6 mmol/L (ref 3.5–5.1)
Sodium: 138 mmol/L (ref 135–145)
Total Bilirubin: 0.6 mg/dL (ref 0.3–1.2)
Total Protein: 6.6 g/dL (ref 6.5–8.1)

## 2022-04-29 LAB — TYPE AND SCREEN
ABO/RH(D): O POS
Antibody Screen: NEGATIVE

## 2022-04-29 LAB — LACTIC ACID, PLASMA
Lactic Acid, Venous: 1 mmol/L (ref 0.5–1.9)
Lactic Acid, Venous: 1.3 mmol/L (ref 0.5–1.9)

## 2022-04-29 LAB — POC OCCULT BLOOD, ED: Fecal Occult Bld: POSITIVE — AB

## 2022-04-29 LAB — HIV ANTIBODY (ROUTINE TESTING W REFLEX): HIV Screen 4th Generation wRfx: NONREACTIVE

## 2022-04-29 SURGERY — ESOPHAGOGASTRODUODENOSCOPY (EGD) WITH PROPOFOL
Anesthesia: Monitor Anesthesia Care

## 2022-04-29 MED ORDER — SODIUM CHLORIDE 0.9 % IV SOLN: INTRAVENOUS | Status: AC

## 2022-04-29 MED ORDER — PROPOFOL 500 MG/50ML IV EMUL
INTRAVENOUS | Status: DC | PRN
  Administered 2022-04-29: 75 ug/kg/min via INTRAVENOUS

## 2022-04-29 MED ORDER — PROPOFOL 10 MG/ML IV BOLUS
INTRAVENOUS | Status: DC | PRN
  Administered 2022-04-29: 40 mg via INTRAVENOUS

## 2022-04-29 MED ORDER — SODIUM CHLORIDE 0.9 % IV SOLN: INTRAVENOUS | Status: DC

## 2022-04-29 MED ORDER — PHENYLEPHRINE 80 MCG/ML (10ML) SYRINGE FOR IV PUSH (FOR BLOOD PRESSURE SUPPORT)
PREFILLED_SYRINGE | INTRAVENOUS | Status: DC | PRN
  Administered 2022-04-29: 160 ug via INTRAVENOUS

## 2022-04-29 MED ORDER — PANTOPRAZOLE INFUSION (NEW) - SIMPLE MED
8.0000 mg/h | INTRAVENOUS | Status: DC
  Administered 2022-04-29 – 2022-04-30 (×2): 8 mg/h via INTRAVENOUS
  Filled 2022-04-29 (×5): qty 100

## 2022-04-29 MED ORDER — ACETAMINOPHEN 650 MG RE SUPP: 650.0000 mg | Freq: Four times a day (QID) | RECTAL | Status: DC | PRN

## 2022-04-29 MED ORDER — PANTOPRAZOLE SODIUM 40 MG IV SOLR
40.0000 mg | Freq: Once | INTRAVENOUS | Status: AC
  Administered 2022-04-29: 40 mg via INTRAVENOUS
  Filled 2022-04-29: qty 10

## 2022-04-29 MED ORDER — NICOTINE 7 MG/24HR TD PT24
7.0000 mg | MEDICATED_PATCH | Freq: Every day | TRANSDERMAL | Status: DC
  Administered 2022-04-29 – 2022-04-30 (×2): 7 mg via TRANSDERMAL
  Filled 2022-04-29 (×3): qty 1

## 2022-04-29 MED ORDER — PANTOPRAZOLE SODIUM 40 MG PO TBEC: 40.0000 mg | DELAYED_RELEASE_TABLET | Freq: Every day | ORAL | Status: DC

## 2022-04-29 MED ORDER — PANTOPRAZOLE SODIUM 40 MG IV SOLR: 40.0000 mg | Freq: Two times a day (BID) | INTRAVENOUS | Status: DC

## 2022-04-29 MED ORDER — LACTATED RINGERS IV SOLN: INTRAVENOUS | Status: DC

## 2022-04-29 MED ORDER — ACETAMINOPHEN 325 MG PO TABS: 650.0000 mg | ORAL_TABLET | Freq: Four times a day (QID) | ORAL | Status: DC | PRN

## 2022-04-29 SURGICAL SUPPLY — 15 items

## 2022-04-29 NOTE — ED Provider Notes (Signed)
[facility] Provider Note   CSN: [id number] Arrival date & time: 04/29/22  0757     History  Chief Complaint  Patient presents with   GI Bleeding    [name] is a 62 y.o. female.  HPI 62 year old female presents today complaining of dark stools and nausea with hematemesis today.  Patient states that she began having loose bowel movements Saturday and had many dark stools on Sunday.  She had some cramping this morning.  She had some dark stool and then vomited dark material and then some with blood in it.  She is not on blood thinners.  She has no history of GI bleeding.  She has had 2 colonoscopies in the past that required polypectomies.  She had some associated weakness and lightheadedness today.  She has had some crampy abdominal pain but does not note any pain at this time.  She denies chest pain, dyspnea Denies etoh or substance [place] No tobacco No history of gi bleed Home Medications Prior to Admission medications   Medication Sig Start Date End Date Taking? Authorizing Provider  Cyanocobalamin (VITAMIN B-12 PO) Take 1 tablet by mouth 3 (three) times daily.   Yes [provider]  diltiazem (CARDIZEM CD) 120 MG 24 hr capsule Take 1 capsule (120 mg total) by mouth daily. 11/21/21  Yes Hensel, Jamal Collin, MD  Probiotic Product (PROBIOTIC PO) Take 1 capsule by mouth at bedtime.   Yes [provider]  Tiotropium Bromide Monohydrate (SPIRIVA RESPIMAT) 2.5 MCG/ACT AERS Inhale 2 puffs into the lungs daily. Patient taking differently: Inhale 2 puffs into the lungs daily as needed (trouble breathing.). 03/27/22  Yes Hensel, Jamal Collin, MD  rosuvastatin (CRESTOR) 40 MG tablet Take 1 tablet (40 mg total) by mouth daily. Patient not taking: Reported on 04/29/2022 10/24/21   Zenia Resides, MD  spironolactone (ALDACTONE) 50 MG tablet Take 1 tablet (50 mg total) by mouth at bedtime. Patient not taking: Reported on 04/29/2022 08/29/21    Shary Key, DO  varenicline (CHANTIX) 0.5 MG tablet Take 1 tablet (0.5 mg total) by mouth 2 (two) times daily. Take 1 daily with food for the first week THEN twice daily with food. Patient not taking: Reported on 04/29/2022 03/11/22   Zenia Resides, MD      Allergies    Asa [aspirin], Flagyl [metronidazole], Gluten meal, Milk-related compounds, Chantix [varenicline], Crestor [rosuvastatin], Eggs or egg-derived products, Fish allergy, and Zocor [simvastatin]    Review of Systems   Review of Systems  Physical Exam Updated Vital Signs BP (!) 113/50   Pulse 68   Temp 97.8 F (36.6 C) (Oral)   Resp 17   Ht 1.638 m (5' 4.5")   Wt 81.2 kg   SpO2 100%   BMI 30.25 kg/m  Physical Exam Vitals and nursing note reviewed.  Constitutional:      Appearance: She is well-developed.  HENT:     Head: Normocephalic and atraumatic.     Right Ear: External ear normal.     Left Ear: External ear normal.     Nose: Nose normal.  Eyes:     Conjunctiva/sclera: Conjunctivae normal.     Pupils: Pupils are equal, round, and reactive to light.  Cardiovascular:     Rate and Rhythm: Normal rate and regular rhythm.     Heart sounds: Normal heart sounds.  Pulmonary:     Effort: Pulmonary effort is normal.     Breath sounds: Normal  breath sounds.  Abdominal:     General: Bowel sounds are normal.     Palpations: Abdomen is soft.  Genitourinary:    Comments: Rectal exam performed. No masses noted Dark stool noted will be sent for Hemoccult Musculoskeletal:        General: Normal range of motion.     Cervical back: Normal range of motion and neck supple.  Skin:    General: Skin is warm and dry.  Neurological:     General: No focal deficit present.     Mental Status: She is alert and oriented to person, place, and time. Mental status is at baseline.     Sensory: No sensory deficit.     Motor: No weakness.     Gait: Gait normal.     Deep Tendon Reflexes: Reflexes are normal and symmetric.  Reflexes normal.  Psychiatric:        Behavior: Behavior normal.        Thought Content: Thought content normal.        Judgment: Judgment normal.     ED Results / Procedures / Treatments   Labs (all labs ordered are listed, but only abnormal results are displayed) Labs Reviewed  COMPREHENSIVE METABOLIC PANEL - Abnormal; Notable for the following components:      Result Value   CO2 21 (*)    Glucose, Bld 132 (*)    All other components within normal limits  CBC - Abnormal; Notable for the following components:   WBC 10.6 (*)    RBC 3.75 (*)    Hemoglobin 10.6 (*)    HCT 32.1 (*)    All other components within normal limits  POC OCCULT BLOOD, ED - Abnormal; Notable for the following components:   Fecal Occult Bld POSITIVE (*)    All other components within normal limits  TYPE AND SCREEN    EKG EKG Interpretation  Date/Time:  Tuesday April 29 2022 08:08:27 EST Ventricular Rate:  74 PR Interval:  123 QRS Duration: 90 QT Interval:  474 QTC Calculation: 526 R Axis:   24 Text Interpretation: Sinus rhythm Probable LVH with secondary repol abnrm Prolonged QT interval Confirmed by Pattricia Boss [phone]) on 04/29/2022 8:15:49 AM  Radiology No results found.  Procedures .Critical Care  Performed by: Pattricia Boss, MD Authorized by: Pattricia Boss, MD   Critical care provider statement:    Critical care time (minutes):  30   Critical care end time:  04/29/2022 10:47 AM   Critical care was time spent personally by me on the following activities:  Development of treatment plan with patient or surrogate, discussions with consultants, evaluation of patient's response to treatment, examination of patient, ordering and review of laboratory studies, ordering and review of radiographic studies, ordering and performing treatments and interventions, pulse oximetry, re-evaluation of patient's condition and review of old charts     Medications Ordered in ED Medications  pantoprazole  (PROTONIX) injection 40 mg (40 mg Intravenous Given 04/29/22 0839)    ED Course/ Medical Decision Making/ A&P Clinical Course as of 04/29/22 1048  Tue Apr 29, 2022  0902 Hemoccult reviewed interpreted and is positive for blood [DR]    Clinical Course User Index [DR] Pattricia Boss, MD                             Medical Decision Making Amount and/or Complexity of Data Reviewed Labs: ordered.  Risk Prescription drug management.   62 year old female  presents today complaining of dark stools and hematemesis. Patient is hemodynamically stable Patient has a soft and nontender abdomen Differential diagnosis includes but is not limited to infectious etiology, upper GI -variceal versus ulcer versus gastritis bleeding versus lower GI bleeding, coagulopathy Patient with Hemoccult positive stool.  Given the hematemesis, suspect upper GI etiology. Patient is given Protonix here in the ED.  Labs were checked and patient has had a hemoglobin drop of 4 g since prior hemoglobin checked. Suspect this is due to her new GI bleeding. Heart rate remains normal and adequate blood pressure noted. Reviewed GI notes patient has been seen by Dr. Ardis Hughs in the past Patient is follow-up with family practice center [facility] with family practice who will see for admission        Final Clinical Impression(s) / ED Diagnoses Final diagnoses:  Acute GI bleeding    Rx / DC Orders ED Discharge Orders     None         Pattricia Boss, MD 04/29/22 1048

## 2022-04-29 NOTE — Transfer of Care (Signed)
Immediate Anesthesia Transfer of Care Note  Patient: [name]  Procedure(s) Performed: ESOPHAGOGASTRODUODENOSCOPY (EGD) WITH PROPOFOL HOT HEMOSTASIS (ARGON PLASMA COAGULATION/BICAP) HEMOSTASIS CLIP PLACEMENT  Patient Location: Endoscopy Unit  Anesthesia Type:MAC  Level of Consciousness: drowsy and patient cooperative  Airway & Oxygen Therapy: Patient Spontanous Breathing and Patient connected to nasal cannula oxygen  Post-op Assessment: Report given to RN, Post -op Vital signs reviewed and stable, and Patient moving all extremities X 4  Post vital signs: Reviewed and stable  Last Vitals:  Vitals Value Taken Time  BP    Temp    Pulse 78   Resp 14   SpO2 100     Last Pain:  Vitals:   04/29/22 1422  TempSrc: Temporal  PainSc: 5          Complications: No notable events documented.

## 2022-04-29 NOTE — Progress Notes (Incomplete)
FMTS Brief Progress Note  S:***   O: BP (!) 136/102   Pulse 70   Temp 98.1 F (36.7 C) (Oral)   Resp 19   Ht 5' 4.5" (1.638 m)   Wt 81.2 kg   SpO2 100%   BMI 30.25 kg/m     A/P: *** - Orders reviewed. Labs for AM {ordered not order:23822}, which was adjusted as needed.  - If condition changes, plan includes ***.   Rolanda Lundborg, MD 04/29/2022, 7:37 PM PGY-***, [facility] Night Resident  Please page [phone] with questions.

## 2022-04-29 NOTE — Anesthesia Preprocedure Evaluation (Signed)
Anesthesia Evaluation  Patient identified by MRN, date of birth, ID band Patient awake    Reviewed: Allergy & Precautions, NPO status , Patient's Chart, lab work & pertinent test results  Airway Mallampati: II       Dental  (+) Poor Dentition, Missing   Pulmonary Current Smoker   Pulmonary exam normal        Cardiovascular hypertension, Pt. on medications Normal cardiovascular exam     Neuro/Psych  negative psych ROS   GI/Hepatic negative GI ROS, Neg liver ROS,,,  Endo/Other  negative endocrine ROS    Renal/GU negative Renal ROS  negative genitourinary   Musculoskeletal   Abdominal Normal abdominal exam  (+)   Peds  Hematology  (+) Blood dyscrasia, anemia   Anesthesia Other Findings   Reproductive/Obstetrics                              Anesthesia Physical Anesthesia Plan  ASA: 2  Anesthesia Plan: MAC   Post-op Pain Management:    Induction:   PONV Risk Score and Plan:   Airway Management Planned: Natural Airway and Mask  Additional Equipment: None  Intra-op Plan:   Post-operative Plan:   Informed Consent: I have reviewed the patients History and Physical, chart, labs and discussed the procedure including the risks, benefits and alternatives for the proposed anesthesia with the patient or authorized representative who has indicated his/her understanding and acceptance.     Dental advisory given  Plan Discussed with: CRNA  Anesthesia Plan Comments:          Anesthesia Quick Evaluation

## 2022-04-29 NOTE — Assessment & Plan Note (Addendum)
Patient presents with 2 days of multiple episodes of dark stool and today had an episode of dark emesis and an episode of bright red emesis with dizziness and pre syncope. On arrival Hgb 10.6, baseline ~ 14. FOBT positive. Started on protonix  Follows with Dr. Ardis Hughs, GI at [facility], last saw him in July for surveillance colonoscopy and removed several polyps. Prior to that colonoscopy in 2012 with six subCM adenomas and L sided diverticulosis with recommendation of repeat colonoscopies q 3 years. Consulted GI who will come to evaluate patient.  - admit to [facility] attending Dr. Gwendlyn Deutscher - consulted GI, appreciate recommendations - vitals per floor - pm hgb check  - lactic acid. If positive will get abdominal CT  - am CBC, BMP - protonix daily

## 2022-04-29 NOTE — ED Triage Notes (Addendum)
Pt arrived via [facility] from home for c/o nausea and abdominal pain that startedx3d. Pt had black stool once on Sun, no BM Mon and today had two black stools today. Pt vomited black substance today then had bright red vomit. Pt c/o lightheadedness, dizziness and nausea today. Pt denies blood thinners. Pt is A&Ox4. EMS gave NS 300 mL and zofran 4 mg. VSS

## 2022-04-29 NOTE — Progress Notes (Signed)
Patient's BP decreased, the lowest since admission. Informed provider on call for a parameter that's fitting for patient.   Per Dr. Jim Like: MAP = or <65 is a great parameter.

## 2022-04-29 NOTE — Assessment & Plan Note (Signed)
Some softer Bps overnight. -Home med [name] '50mg'$  daily

## 2022-04-29 NOTE — H&P (Signed)
Hospital Admission History and Physical Service Pager: [phone]  Patient name: [name] Medical record number: [id number] Date of Birth: 1960/10/25 Age: 62 y.o. Gender: female  Primary Care Provider: Shary Key, DO Consultants: GI  Code Status: Full  Preferred Emergency Contact:  Contact Information     Name Relation Home Work Mobile   Rankin,Tomiko Daughter [phone]          Chief Complaint: Dark stool, bright red emesis, dizziness, abdominal discomfort   Assessment and Plan: [name] is a 62 y.o. female presenting with dark stool, hematemesis, abdominal discomfort.  . Differential for this patient's presentation of this includes upper GI bleed 2/2 to ulcer or gastritis given bright red emesis. Less likely esophageal varices. Possibly Diverticulosis given bleeding with mild pain though white count only mildly elevated  PMH HTN, diverticulosis, tubular adenoma, subarachnoid hemorrhage, COPD, tobacco use  * GI bleed Patient presents with 2 days of multiple episodes of dark stool and today had an episode of dark emesis and an episode of bright red emesis with dizziness and pre syncope. On arrival Hgb 10.6, baseline ~ 14. FOBT positive. Started on protonix  Follows with Dr. Ardis Hughs, GI at [facility], last saw him in July for surveillance colonoscopy and removed several polyps. Prior to that colonoscopy in 2012 with six subCM adenomas and L sided diverticulosis with recommendation of repeat colonoscopies q 3 years. Consulted GI who will come to evaluate patient.  - admit to [facility] attending Dr. Gwendlyn Deutscher - consulted GI, appreciate recommendations - vitals per floor - pm hgb check  - lactic acid. If positive will get abdominal CT  - am CBC, BMP - protonix daily   Hypertension BP fairly normotensive. Home med [name] '50mg'$  daily - hold med for now given normotensive    Tobacco use- Currently smoking about 5 cigarettes a day. Not taking chantix, planned on  starting nicotine patch this week. Will start today  Severe concentric LV hypertrophy   FEN/GI: npo  VTE Prophylaxis: SCDs given GI bleed   Disposition: med-surg   History of Present Illness:  [name] is a 62 y.o. female presenting with dark stool, bright red emesis, dizziness, abdominal discomfort   Presents with 3 days of nausea, abdominal discomfort. Sunday morning went to the restroom and stool was black which occurred on several occations yesterday and today.  Some stool loose, but primarily formed. Went to work yesterday. Yesterday stool was still black. Also with abdominal discomfort but not severe pain. Not worsened with food. This morning had episode of emesis black substance and then a significant amount of bright red blood emesis. This morning also got lightheaded while on the commode. Also felt this way when in the shower feeling like she was going to pass out. Felt as if she was dying so had son call EMS  In the ED, Hgb 10.6. FOBT positive. Received protonix    Review Of Systems: Per HPI   Pertinent Past Medical History: HTN, diverticulosis, tubular adenoma, subarachnoid hemorrhage, COPD, tobacco use  Pertinent Past Surgical History:  Past Surgical History:  Procedure Laterality Date   ABDOMINAL HYSTERECTOMY       Pertinent Social History: Tobacco use: Yes currently smoking about 5 cigarettes a day  Alcohol use: none Other Substance use: none  Pertinent Family History: Family History  Problem Relation Age of Onset   Hyperlipidemia Mother    Hypertension Mother    Diabetes Mother    Renal Disease Mother  Congestive Heart Failure Mother    Hypertension Maternal Grandfather    Diabetes Maternal Grandfather    Heart disease Paternal Grandmother        died from mycardial infarcation   Colon polyps Daughter 72   Colon cancer Neg Hx    Esophageal cancer Neg Hx    Stomach cancer Neg Hx    Rectal cancer Neg Hx      Important Outpatient  Medications:  Spironolactone '50mg'$  daily  Not taking Crestor because she did not like the way it made her feel  Spiriva- 1 puff daily   Objective: BP (!) 118/53   Pulse 74   Temp 97.9 F (36.6 C) (Oral)   Resp 16   Ht 5' 4.5" (1.638 m)   Wt 81.2 kg   SpO2 99%   BMI 30.25 kg/m  Exam: General: well appearing, laying in bed, NAD Eyes: PERRL. EOMI. Normal conjunctiva  ENTM: MMM. Normal oropharynx  Neck: normal Cardiovascular: RRR. Systolic murmur  Respiratory: CTAB normal WOB  Gastrointestinal: soft, non distended. Mildly tender to palpation diffusely MSK: moving extremities equally and spontaneously Derm: warm, dry.  Neuro: alert and oriented. CN 2-12 in tact  Psych: mood and affect appropriate. Speech non tangential. Normal thought content   Labs:  CBC BMET  Recent Labs  Lab 04/29/22 0830  WBC 10.6*  HGB 10.6*  HCT 32.1*  PLT 321   Recent Labs  Lab 04/29/22 0830  NA 138  K 3.6  CL 107  CO2 21*  BUN 21  CREATININE 0.81  GLUCOSE 132*  CALCIUM 9.0      EKG: NSR. LVH. Prolonged Qt interval    Imaging Studies Performed: None    Shary Key, DO 04/29/2022, 12:46 PM PGY-3, [place] Intern pager: [phone], text pages welcome Secure chat group [place]

## 2022-04-29 NOTE — Consult Note (Signed)
Consultation Note   Referring Provider:  Teaching Service PCP: Shary Key, DO Primary Gastroenterologist: Oretha Caprice, MD     Reason for consultation: GI bleed   Hospital Day: 1  ASSESSMENT  # 62 yo female with hematemesis / melena. BP a little soft, vital signs otherwise stable. Rule out ulcer   # Acute blood loss anemia secondary to above. .Hgb 10.6, down from 14 in January 2023.   # Prolonged QTc of 526  # Adenomatous colon polyps x 2 in July 2023   PLAN  - Will change PO Pantoprazole to IV for time being.  - Monitor hgb, transfuse for hgb <7.   - Will need EGD, suspect it will likely be tomorrow but keep NPO until I can get date / time.  The risks and benefits of EGD with possible biopsies were discussed with the patient who agrees to proceed.  --If EGD will not be done today she can have sips of clears   HPI:  Patient is a 62 y.o. year old female with a past medical history of colon polyps, diverticulosis, HTN, CVA    See PMH for any additional medical problems.  On Sunday Ellie began passing formed black stool. She had several episodes. No bismuth or iron use. She had vague, mild generalized abdominal discomfort. Over the weekend she continued to pass black stools and this had dark emesis followed by bright red emesis this am. She has been lightheaded. She has had some SHOB but that has been since having COVID19.  She doesn't take NSAIDS. No history of PUD. Prior to Sunday she felt okay.   In ED:  DBP soft in 50's. HR normal. Hgb 10.6, down from 14 in Jan 2023. MCV 85. Normal BUN. Normal LFTs. FOBT+   Previous GI History / Evaluation :   July 2023 screening colonoscopy  -Two sessile polyps were found in the transverse colon and ascending colon. The polyps were 3 to 4 mm in size. These polyps were removed with a cold snare. Resection and retrieval were complete. Findings: - Multiple small and large-mouthed diverticula  were found in the entire colon. - The exam was otherwise without abnormality on direct and retroflexion views  Path - tubular adenomas   Recent Imaging and Labs: No results found.  Labs:  Recent Labs    04/29/22 0830  WBC 10.6*  HGB 10.6*  HCT 32.1*  PLT 321   Recent Labs    04/29/22 0830  NA 138  K 3.6  CL 107  CO2 21*  GLUCOSE 132*  BUN 21  CREATININE 0.81  CALCIUM 9.0   Recent Labs    04/29/22 0830  PROT 6.6  ALBUMIN 3.5  AST 19  ALT 13  ALKPHOS 65  BILITOT 0.6   No results for input(s): "HEPBSAG", "HCVAB", "HEPAIGM", "HEPBIGM" in the last 72 hours. No results for input(s): "LABPROT", "INR" in the last 72 hours.  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) 2013  Past Surgical History:  Procedure Laterality Date   ABDOMINAL HYSTERECTOMY      Family History  Problem Relation Age of Onset   Hyperlipidemia Mother    Hypertension Mother    Diabetes Mother    Renal Disease Mother    Congestive Heart Failure Mother    Hypertension Maternal Grandfather    Diabetes Maternal Grandfather    Heart disease Paternal Grandmother        died from mycardial infarcation   Colon polyps Daughter 70   Colon cancer Neg Hx    Esophageal cancer Neg Hx    Stomach cancer Neg Hx    Rectal cancer Neg Hx     Prior to Admission medications   Medication Sig Start Date End Date Taking? Authorizing Provider  Cyanocobalamin (VITAMIN B-12 PO) Take 1 tablet by mouth 3 (three) times daily.   Yes [provider]  diltiazem (CARDIZEM CD) 120 MG 24 hr capsule Take 1 capsule (120 mg total) by mouth daily. 11/21/21  Yes Hensel, Jamal Collin, MD  Probiotic Product (PROBIOTIC PO) Take 1 capsule by mouth at bedtime.   Yes [provider]  Tiotropium Bromide Monohydrate (SPIRIVA RESPIMAT) 2.5 MCG/ACT AERS Inhale 2 puffs into the lungs daily. Patient taking differently: Inhale 2 puffs into the lungs daily as needed (trouble breathing.). 03/27/22  Yes Hensel, Jamal Collin, MD  rosuvastatin (CRESTOR) 40 MG tablet Take 1 tablet (40 mg total) by mouth daily. Patient not taking: Reported on 04/29/2022 10/24/21   Zenia Resides, MD  spironolactone (ALDACTONE) 50 MG tablet Take 1 tablet (50 mg total) by mouth at bedtime. Patient not taking: Reported on 04/29/2022 08/29/21   Shary Key, DO  varenicline (CHANTIX) 0.5 MG tablet Take 1 tablet (0.5 mg total) by mouth 2 (two) times daily. Take 1 daily with food for the first week THEN twice daily with food. Patient not taking: Reported on 04/29/2022 03/11/22   Zenia Resides, MD    Current Facility-Administered Medications  Medication Dose Route Frequency Provider Last Rate Last Admin   acetaminophen (TYLENOL) tablet 650 mg  650 mg Oral Q6H PRN Shary Key, DO       Or   acetaminophen (TYLENOL) suppository 650 mg  650 mg Rectal Q6H PRN Paige, Victoria J, DO       nicotine (NICODERM CQ - dosed in mg/24 hr) patch 7 mg  7 mg Transdermal Daily Paige, Victoria J, DO   7 mg at 04/29/22 1136   [START ON 04/30/2022] pantoprazole (PROTONIX) EC tablet 40 mg  40 mg Oral Daily Shary Key, DO       Current Outpatient Medications  Medication Sig Dispense Refill   Cyanocobalamin (VITAMIN B-12 PO) Take 1 tablet by mouth 3 (three) times daily.     diltiazem (CARDIZEM CD) 120 MG 24 hr capsule Take 1 capsule (120 mg total) by mouth daily. 90 capsule 3   Probiotic Product (PROBIOTIC PO) Take 1 capsule by mouth at bedtime.     Tiotropium Bromide Monohydrate (SPIRIVA RESPIMAT) 2.5 MCG/ACT AERS Inhale 2 puffs into the lungs daily. (Patient taking differently: Inhale 2 puffs into the lungs daily as needed (trouble breathing.).) 1 each 6   rosuvastatin (CRESTOR) 40 MG tablet Take 1 tablet (40 mg total)  by mouth daily. (Patient not taking: Reported on 04/29/2022) 90 tablet 3   spironolactone (ALDACTONE) 50 MG tablet Take 1 tablet (50 mg total) by mouth at bedtime. (Patient not taking: Reported on 04/29/2022) 90 tablet 3  varenicline (CHANTIX) 0.5 MG tablet Take 1 tablet (0.5 mg total) by mouth 2 (two) times daily. Take 1 daily with food for the first week THEN twice daily with food. (Patient not taking: Reported on 04/29/2022) 60 tablet 2    Allergies as of 04/29/2022 - Review Complete 04/29/2022  Allergen Reaction Noted   Asa [aspirin] Nausea Only, Palpitations, and Other (See Comments) 08/21/2010   Flagyl [metronidazole] Other (See Comments)    Gluten meal Nausea And Vomiting and Other (See Comments) 08/16/2021   Milk-related compounds Nausea And Vomiting and Other (See Comments) 08/16/2021   Chantix [varenicline] Other (See Comments) 04/29/2022   Crestor [rosuvastatin] Other (See Comments)    Eggs or egg-derived products Nausea Only 09/20/2010   Fish allergy Hives, Itching, and Rash 04/29/2022   Zocor [simvastatin] Rash 05/08/2010    Social History   Socioeconomic History   Marital status: Single    Spouse name: Not on file   Number of children: 1   Years of education: Not on file   Highest education level: Not on file  Occupational History   Not on file  Tobacco Use   Smoking status: Every Day    Packs/day: 0.50    Years: 41.00    Total pack years: 20.50    Types: Cigarettes    Start date: 02/25/1980   Smokeless tobacco: Never   Tobacco comments:    Quit in 2010 for 3 months - reduced to 6 per day 11/2021  Vaping Use   Vaping Use: Never used  Substance and Sexual Activity   Alcohol use: Not Currently    Comment: OCCASIONAL   Drug use: No   Sexual activity: Yes    Birth control/protection: Surgical, Condom  Other Topics Concern   Not on file  Social History Narrative   Financial assistance approved for 100% discount at [facility] and has [facility] card per [employer]   03/14/2010          Social Determinants of Health   Financial Resource Strain: High Risk (08/28/2021)   Overall Financial Resource Strain (CARDIA)    Difficulty of Paying Living Expenses: Very hard  Food Insecurity: No Food Insecurity (08/28/2021)   Hunger Vital Sign    Worried About Running Out of Food in the Last Year: Never true    Ran Out of Food in the Last Year: Never true  Transportation Needs: No Transportation Needs (08/28/2021)   PRAPARE - Hydrologist (Medical): No    Lack of Transportation (Non-Medical): No  Physical Activity: Not on file  Stress: Not on file  Social Connections: Not on file  Intimate Partner Violence: Not on file    Review of Systems: All systems reviewed and negative except where noted in HPI.  Physical Exam: Vital signs in last 24 hours: Temp:  [97.8 F (36.6 C)-97.9 F (36.6 C)] 97.9 F (36.6 C) (03/05 1157) Pulse Rate:  [68-82] 74 (03/05 1130) Resp:  [15-25] 16 (03/05 1130) BP: (113-150)/(34-95) 118/53 (03/05 1130) SpO2:  [98 %-100 %] 99 % (03/05 1130) Weight:  [81.2 kg] 81.2 kg (03/05 [id number])    General:  Alert female in NAD Psych:  Pleasant, cooperative. Normal mood and affect Eyes: Pupils equal Ears:  Normal auditory acuity Nose: No deformity, discharge or lesions Neck:  Supple, no masses felt Lungs:  Clear to auscultation.  Heart:  Regular rate, regular rhythm.  Abdomen:  Soft, nondistended, nontender, active bowel sounds, no masses felt Rectal :  Deferred Msk: Symmetrical  without gross deformities.  Neurologic:  Alert, oriented, grossly normal neurologically Extremities : No edema Skin:  Intact without significant lesions.    Intake/Output from previous day: No intake/output data recorded. Intake/Output this shift:  No intake/output data recorded.    Principal Problem:   GI bleed Active Problems:   Hypertension    Tye Savoy, NP-C @  04/29/2022, 12:54 PM

## 2022-04-29 NOTE — Hospital Course (Signed)
Saturday stomach started feeling bad but nothing major. Sunday morning went to the resstroom at stool was black all day stunday. Some stool loose, some formed  Went to work yesterday. Yesterday stool was still black. Stomach still feeling bad. This morning got lightheaded while on the commode. Also felt this way when in the shower feeling like she was going to pass out. This morning throiugh up black substance and  then last episode was throwing up a lot of bright red blood. Felt as if she was dying.  Describes stomach as an intermittent nagging discomfort. Not necessarily pain Stomach pain not worsened with food.   Meds:   Spironolactone '50mg'$  daily  Not taking Crestor  Spiriva- 1 puff daily   Last seen by GI  [place]   Tobacco: 5 cogarettes a day Would like to start the patch Alcohol: none Rec drugs: none    Full code

## 2022-04-29 NOTE — Op Note (Signed)
[facility] Patient Name: [name] Procedure Date : 04/29/2022 MRN: [id number] Attending MD: Carlota Raspberry. Havery Moros , MD, [id number] Date of Birth: 1960/12/23 CSN: [id number] Age: 62 Admit Type: Inpatient Procedure:                Upper GI endoscopy Indications:              Hematemesis, Melena Providers:                Remo Lipps P. Havery Moros, MD, Fanny Skates RN, RN,                            Frazier Richards, Technician Referring MD:              Medicines:                Monitored Anesthesia Care Complications:            No immediate complications. Estimated blood loss:                            Minimal. Estimated Blood Loss:     Estimated blood loss was minimal. Procedure:                Pre-Anesthesia Assessment:                           - Prior to the procedure, a History and Physical                            was performed, and patient medications and                            allergies were reviewed. The patient's tolerance of                            previous anesthesia was also reviewed. The risks                            and benefits of the procedure and the sedation                            options and risks were discussed with the patient.                            All questions were answered, and informed consent                            was obtained. Prior Anticoagulants: The patient has                            taken no anticoagulant or antiplatelet agents. ASA                            Grade Assessment: III - A patient with severe  systemic disease. After reviewing the risks and                            benefits, the patient was deemed in satisfactory                            condition to undergo the procedure.                           After obtaining informed consent, the endoscope was                            passed under direct vision. Throughout the                            procedure, the patient's  blood pressure, pulse, and                            oxygen saturations were monitored continuously. The                            [id number]) Olympus endoscope was introduced                            through the mouth, and advanced to the second part                            of duodenum. The upper GI endoscopy was                            accomplished without difficulty. The patient                            tolerated the procedure well. Scope In: Scope Out: Findings:      Esophagogastric landmarks were identified: the Z-line was found at 38       cm, the gastroesophageal junction was found at 38 cm and the upper       extent of the gastric folds was found at 38 cm from the incisors.      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      No other significant abnormalities were identified in a careful       examination of the esophagus.      A single small angiodysplastic lesion with no bleeding was found in the       cardia just inferior to the GEJ. Fulguration to ablate the lesion to       prevent bleeding by argon plasma was successful.      Two small angiodysplastic lesions with no bleeding were found in the       gastric fundus. Fulguration to ablate the lesion to prevent bleeding by       argon plasma was successful.      A single medium angiodysplastic lesion was found in the gastric body. It       was quite friable and oozed when lavaged with irrigation. Fulguration to  stop the bleeding by argon plasma was successful. Two hemostatic clips       were then successfully placed across the lesion. There was no bleeding       at the end of the procedure.      The exam of the stomach was otherwise normal.      A few small angiodysplastic lesions were found in the duodenal bulb.       Fulguration to ablate the lesion to prevent bleeding by argon plasma was       successful.      A single 3 to 4 mm sessile polyp was found in the duodenal bulb. It was        not removed during this exam given recent bleeding.      The exam of the duodenum was otherwise normal. Impression:               - Esophagogastric landmarks identified.                           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                           - Normal esophagus otherwise.                           - A single non-bleeding angiodysplastic lesion in                            the cardia. Treated with argon plasma coagulation                            (APC).                           - Two non-bleeding angiodysplastic lesions in the                            fundus. Treated with argon plasma coagulation (APC).                           - A single bleeding angiodysplastic lesion in the                            gastric body. Treated with argon plasma coagulation                            (APC). This is the likely culprit lesion for her                            bleeding.                           - A few angiodysplastic lesions in the duodenum.                            Treated with argon plasma coagulation (APC).                           -  A single duodenal polyp not removed                           - Normal duodenum otherwise. Recommendation:           - Return patient to hospital ward for ongoing care.                           - Clear liquid diet tonight, advance tomorrow if                            otherwise stable                           - Continue present medications.                           - Continue IV protonix for now                           - Monitor for recurrent symptoms , trend Hgb                           - We will reassess her tomorrow, call with                            questions in the interim Procedure Code(s):        --- Professional ---                           [phone], Esophagogastroduodenoscopy, flexible,                            transoral; with control of bleeding, any method Diagnosis Code(s):        --- Professional ---                            Q39.4, Esophageal web                           K31.811, Angiodysplasia of stomach and duodenum                            with bleeding                           K92.0, Hematemesis                           K92.1, Melena (includes Hematochezia) CPT copyright 2022 American Medical Association. All rights reserved. The codes documented in this report are preliminary and upon coder review may  be revised to meet current compliance requirements. Remo Lipps P. Elizeo Rodriques, MD 04/29/2022 3:24:12 PM This report has been signed electronically. Number of Addenda: 0

## 2022-04-29 NOTE — Progress Notes (Signed)
Patient arrived on unit at 76. Patient is A&Ox4.

## 2022-04-29 NOTE — ED Notes (Signed)
ED TO INPATIENT HANDOFF REPORT  ED Nurse Name and Phone #:  Bland Span [phone]  S Name/Age/Gender [name] 62 y.o. female Room/Bed: 040C/040C  Code Status   Code Status: Full Code  Home/SNF/Other Home Patient oriented to: self, place, time, and situation Is this baseline? Yes   Triage Complete: Triage complete  Chief Complaint GI bleed [K92.2]  Triage Note Pt arrived via [facility] from home for c/o nausea and abdominal pain that startedx3d. Pt had black stool once on Sun, no BM Mon and today had two black stools today. Pt vomited black substance today then had bright red vomit. Pt c/o lightheadedness, dizziness and nausea today. Pt denies blood thinners. Pt is A&Ox4. EMS gave NS 300 mL and zofran 4 mg. VSS   Allergies Allergies  Allergen Reactions   Asa [Aspirin] Nausea Only, Palpitations and Other (See Comments)    GI upset Tremors Reactions to '325mg'$  dose   Flagyl [Metronidazole] Other (See Comments)    Yeast infections   Gluten Meal Nausea And Vomiting and Other (See Comments)    Bloating Flatulence   Milk-Related Compounds Nausea And Vomiting and Other (See Comments)    Bloating Gas   Chantix [Varenicline] Other (See Comments)    Dizziness   Crestor [Rosuvastatin] Other (See Comments)    Myalgias    Eggs Or Egg-Derived Products Nausea Only   Fish Allergy Hives, Itching and Rash    Specifically tuna   Zocor [Simvastatin] Rash    Level of Care/Admitting Diagnosis ED Disposition     ED Disposition  Admit   Condition  --   [place]: [place] [[id number]]  Level of Care: Med-Surg [16]  May place patient in observation at [facility] or [place] if equivalent level of care is available:: Yes  Covid Evaluation: Asymptomatic - no recent exposure (last 10 days) testing not required  Diagnosis: GI bleed [id number]  Admitting Physician: Shary Key [id number]  Attending Physician: Marla Roe           B Medical/Surgery History Past Medical History:  Diagnosis Date   Allergy    Arthritis    Elevated blood-pressure reading without diagnosis of hypertension 05/16/2019   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   Hyperlipidemia    Hypertension    Seasonal allergies    Sickle cell trait ([place])    Stroke ([place]) 2013   Past Surgical History:  Procedure Laterality Date   ABDOMINAL HYSTERECTOMY       A IV Location/Drains/Wounds Patient Lines/Drains/Airways Status     Active Line/Drains/Airways     Name Placement date Placement time Site Days   Peripheral IV 04/29/22 20 G Left Antecubital 04/29/22  --  Antecubital  less than 1            Intake/Output Last 24 hours No intake or output data in the 24 hours ending 04/29/22 1302  Labs/Imaging Results for orders placed or performed during the hospital encounter of 04/29/22 (from the past 48 hour(s))  POC occult blood, ED     Status: Abnormal   Collection Time: 04/29/22  8:14 AM  Result Value Ref Range   Fecal Occult Bld POSITIVE (A) NEGATIVE  Comprehensive metabolic panel     Status: Abnormal   Collection Time: 04/29/22  8:30 AM  Result Value Ref Range   Sodium 138 135 - 145 mmol/L   Potassium 3.6 3.5 - 5.1 mmol/L   Chloride 107 98 - 111 mmol/L   CO2 21 (L) 22 -  32 mmol/L   Glucose, Bld 132 (H) 70 - 99 mg/dL    Comment: Glucose reference range applies only to samples taken after fasting for at least 8 hours.   BUN 21 8 - 23 mg/dL   Creatinine, Ser 0.81 0.44 - 1.00 mg/dL   Calcium 9.0 8.9 - 10.3 mg/dL   Total Protein 6.6 6.5 - 8.1 g/dL   Albumin 3.5 3.5 - 5.0 g/dL   AST 19 15 - 41 U/L   ALT 13 0 - 44 U/L   Alkaline Phosphatase 65 38 - 126 U/L   Total Bilirubin 0.6 0.3 - 1.2 mg/dL   GFR, Estimated >60 >60 mL/min    Comment: (NOTE) Calculated using the CKD-EPI Creatinine Equation (2021)    Anion gap 10 5 - 15    Comment: Performed at [facility] [address], [place], [place] [address]  CBC     Status:  Abnormal   Collection Time: 04/29/22  8:30 AM  Result Value Ref Range   WBC 10.6 (H) 4.0 - 10.5 K/uL   RBC 3.75 (L) 3.87 - 5.11 MIL/uL   Hemoglobin 10.6 (L) 12.0 - 15.0 g/dL   HCT 32.1 (L) 36.0 - 46.0 %   MCV 85.6 80.0 - 100.0 fL   MCH 28.3 26.0 - 34.0 pg   MCHC 33.0 30.0 - 36.0 g/dL   RDW 14.0 11.5 - 15.5 %   Platelets 321 150 - 400 K/uL   nRBC 0.0 0.0 - 0.2 %    Comment: Performed at [facility], [place] [address], [place], [place] [address]  Type and screen [place]     Status: None   Collection Time: 04/29/22  8:30 AM  Result Value Ref Range   ABO/RH(D) O POS    Antibody Screen NEG    Sample Expiration      05/02/2022,2359 Performed at [facility], [place] [address] [place],  [address]    No results found.  Pending Labs Unresulted Labs (From admission, onward)     Start     Ordered   04/30/22 [id number]  Basic metabolic panel  Tomorrow morning,   R        04/29/22 1110   04/30/22 0500  CBC  Tomorrow morning,   R        04/29/22 1110   04/29/22 1500  CBC  Once,   R        04/29/22 1110   04/29/22 1227  Lactic acid, plasma  STAT Now then every 3 hours,   R (with STAT occurrences)      04/29/22 1226   04/29/22 1105  HIV Antibody (routine testing w rflx)  (HIV Antibody (Routine testing w reflex) panel)  Once,   R        04/29/22 1110            Vitals/Pain Today's Vitals   04/29/22 1030 04/29/22 1100 04/29/22 1130 04/29/22 1157  BP: (!) 150/55 (!) 130/34 (!) 118/53   Pulse: 82 74 74   Resp: (!) 25 (!) 21 16   Temp:    97.9 F (36.6 C)  TempSrc:    Oral  SpO2: 100% 99% 99%   Weight:      Height:      PainSc:        Isolation Precautions No active isolations  Medications Medications  acetaminophen (TYLENOL) tablet 650 mg (has no administration in time range)    Or  acetaminophen (TYLENOL) suppository 650 mg (has no administration in time range)  nicotine (NICODERM CQ - dosed in mg/24 hr) patch 7 mg (7 mg Transdermal  Patch Applied 04/29/22 1136)  pantoprazole (PROTONIX) EC tablet 40 mg (has no administration in time range)  pantoprazole (PROTONIX) injection 40 mg (40 mg Intravenous Given 04/29/22 0839)    Mobility walks     Focused Assessments    R Recommendations: See Admitting Provider Note  Report given to:   Additional Notes:  Patient A&Ox4. NPO. 20g LAC

## 2022-04-30 ENCOUNTER — Other Ambulatory Visit (HOSPITAL_COMMUNITY): Payer: Self-pay

## 2022-04-30 ENCOUNTER — Telehealth: Payer: Self-pay

## 2022-04-30 DIAGNOSIS — N390 Urinary tract infection, site not specified: Secondary | ICD-10-CM | POA: Diagnosis present

## 2022-04-30 DIAGNOSIS — Z8719 Personal history of other diseases of the digestive system: Secondary | ICD-10-CM | POA: Diagnosis not present

## 2022-04-30 DIAGNOSIS — Z91012 Allergy to eggs: Secondary | ICD-10-CM | POA: Diagnosis not present

## 2022-04-30 DIAGNOSIS — Z883 Allergy status to other anti-infective agents status: Secondary | ICD-10-CM | POA: Diagnosis not present

## 2022-04-30 DIAGNOSIS — J449 Chronic obstructive pulmonary disease, unspecified: Secondary | ICD-10-CM | POA: Diagnosis present

## 2022-04-30 DIAGNOSIS — Z91013 Allergy to seafood: Secondary | ICD-10-CM | POA: Diagnosis not present

## 2022-04-30 DIAGNOSIS — K317 Polyp of stomach and duodenum: Secondary | ICD-10-CM | POA: Diagnosis present

## 2022-04-30 DIAGNOSIS — K922 Gastrointestinal hemorrhage, unspecified: Secondary | ICD-10-CM | POA: Diagnosis present

## 2022-04-30 DIAGNOSIS — F17210 Nicotine dependence, cigarettes, uncomplicated: Secondary | ICD-10-CM | POA: Diagnosis present

## 2022-04-30 DIAGNOSIS — Z8673 Personal history of transient ischemic attack (TIA), and cerebral infarction without residual deficits: Secondary | ICD-10-CM | POA: Diagnosis not present

## 2022-04-30 DIAGNOSIS — Z8249 Family history of ischemic heart disease and other diseases of the circulatory system: Secondary | ICD-10-CM | POA: Diagnosis not present

## 2022-04-30 DIAGNOSIS — I10 Essential (primary) hypertension: Secondary | ICD-10-CM | POA: Diagnosis present

## 2022-04-30 DIAGNOSIS — Z888 Allergy status to other drugs, medicaments and biological substances status: Secondary | ICD-10-CM | POA: Diagnosis not present

## 2022-04-30 DIAGNOSIS — Q2733 Arteriovenous malformation of digestive system vessel: Secondary | ICD-10-CM | POA: Diagnosis not present

## 2022-04-30 DIAGNOSIS — Z91011 Allergy to milk products: Secondary | ICD-10-CM | POA: Diagnosis not present

## 2022-04-30 DIAGNOSIS — E785 Hyperlipidemia, unspecified: Secondary | ICD-10-CM | POA: Diagnosis present

## 2022-04-30 DIAGNOSIS — Z86010 Personal history of colonic polyps: Secondary | ICD-10-CM | POA: Diagnosis not present

## 2022-04-30 DIAGNOSIS — Z83438 Family history of other disorder of lipoprotein metabolism and other lipidemia: Secondary | ICD-10-CM | POA: Diagnosis not present

## 2022-04-30 DIAGNOSIS — K31811 Angiodysplasia of stomach and duodenum with bleeding: Secondary | ICD-10-CM | POA: Diagnosis present

## 2022-04-30 DIAGNOSIS — Z79899 Other long term (current) drug therapy: Secondary | ICD-10-CM | POA: Diagnosis not present

## 2022-04-30 DIAGNOSIS — Z90710 Acquired absence of both cervix and uterus: Secondary | ICD-10-CM | POA: Diagnosis not present

## 2022-04-30 DIAGNOSIS — D62 Acute posthemorrhagic anemia: Secondary | ICD-10-CM | POA: Diagnosis present

## 2022-04-30 DIAGNOSIS — Z886 Allergy status to analgesic agent status: Secondary | ICD-10-CM | POA: Diagnosis not present

## 2022-04-30 DIAGNOSIS — Z91018 Allergy to other foods: Secondary | ICD-10-CM | POA: Diagnosis not present

## 2022-04-30 LAB — URINALYSIS, ROUTINE W REFLEX MICROSCOPIC
Bilirubin Urine: NEGATIVE
Glucose, UA: NEGATIVE mg/dL
Hgb urine dipstick: NEGATIVE
Ketones, ur: NEGATIVE mg/dL
Nitrite: NEGATIVE
Protein, ur: NEGATIVE mg/dL
Specific Gravity, Urine: 1.008 (ref 1.005–1.030)
pH: 5 (ref 5.0–8.0)

## 2022-04-30 LAB — CBC
HCT: 24.8 % — ABNORMAL LOW (ref 36.0–46.0)
HCT: 24.4 % — ABNORMAL LOW (ref 36.0–46.0)
HCT: 25.9 % — ABNORMAL LOW (ref 36.0–46.0)
Hemoglobin: 8.1 g/dL — ABNORMAL LOW (ref 12.0–15.0)
Hemoglobin: 8.3 g/dL — ABNORMAL LOW (ref 12.0–15.0)
Hemoglobin: 8.3 g/dL — ABNORMAL LOW (ref 12.0–15.0)
MCH: 27.6 pg (ref 26.0–34.0)
MCH: 28.5 pg (ref 26.0–34.0)
MCH: 27.7 pg (ref 26.0–34.0)
MCHC: 32.7 g/dL (ref 30.0–36.0)
MCHC: 34 g/dL (ref 30.0–36.0)
MCHC: 32 g/dL (ref 30.0–36.0)
MCV: 84.6 fL (ref 80.0–100.0)
MCV: 83.8 fL (ref 80.0–100.0)
MCV: 86.3 fL (ref 80.0–100.0)
Platelets: 266 10*3/uL (ref 150–400)
Platelets: 257 10*3/uL (ref 150–400)
Platelets: 254 10*3/uL (ref 150–400)
RBC: 2.93 MIL/uL — ABNORMAL LOW (ref 3.87–5.11)
RBC: 2.91 MIL/uL — ABNORMAL LOW (ref 3.87–5.11)
RBC: 3 MIL/uL — ABNORMAL LOW (ref 3.87–5.11)
RDW: 14 % (ref 11.5–15.5)
RDW: 14.2 % (ref 11.5–15.5)
RDW: 14.1 % (ref 11.5–15.5)
WBC: 10.3 10*3/uL (ref 4.0–10.5)
WBC: 9.2 10*3/uL (ref 4.0–10.5)
WBC: 10 10*3/uL (ref 4.0–10.5)
nRBC: 0 % (ref 0.0–0.2)
nRBC: 0 % (ref 0.0–0.2)
nRBC: 0 % (ref 0.0–0.2)

## 2022-04-30 LAB — BASIC METABOLIC PANEL
Anion gap: 7 (ref 5–15)
BUN: 12 mg/dL (ref 8–23)
CO2: 23 mmol/L (ref 22–32)
Calcium: 8.6 mg/dL — ABNORMAL LOW (ref 8.9–10.3)
Chloride: 107 mmol/L (ref 98–111)
Creatinine, Ser: 0.65 mg/dL (ref 0.44–1.00)
GFR, Estimated: >60 mL/min (ref >60)
Glucose, Bld: 102 mg/dL — ABNORMAL HIGH (ref 70–99)
Potassium: 3.6 mmol/L (ref 3.5–5.1)
Sodium: 137 mmol/L (ref 135–145)

## 2022-04-30 MED ORDER — CEPHALEXIN 500 MG PO CAPS
500.0000 mg | ORAL_CAPSULE | Freq: Two times a day (BID) | ORAL | 0 refills | Status: AC
  Filled 2022-04-30: qty 12, 6d supply, fill #0

## 2022-04-30 MED ORDER — LACTATED RINGERS IV BOLUS
1000.0000 mL | Freq: Once | INTRAVENOUS | Status: AC
  Administered 2022-04-30: 1000 mL via INTRAVENOUS

## 2022-04-30 MED ORDER — PROCHLORPERAZINE EDISYLATE 10 MG/2ML IJ SOLN: 10.0000 mg | Freq: Once | INTRAMUSCULAR | Status: AC

## 2022-04-30 MED ORDER — PANTOPRAZOLE SODIUM 40 MG PO TBEC
40.0000 mg | DELAYED_RELEASE_TABLET | Freq: Two times a day (BID) | ORAL | 0 refills | Status: DC
  Filled 2022-04-30: qty 60, 30d supply, fill #0

## 2022-04-30 MED ORDER — CEPHALEXIN 500 MG PO CAPS
500.0000 mg | ORAL_CAPSULE | Freq: Two times a day (BID) | ORAL | Status: DC
  Administered 2022-04-30: 500 mg via ORAL
  Filled 2022-04-30: qty 1

## 2022-04-30 MED ORDER — PROCHLORPERAZINE MALEATE 10 MG PO TABS
10.0000 mg | ORAL_TABLET | Freq: Once | ORAL | Status: AC
  Administered 2022-04-30: 10 mg via ORAL
  Filled 2022-04-30: qty 1

## 2022-04-30 NOTE — Anesthesia Postprocedure Evaluation (Signed)
Anesthesia Post Note  Patient: [name]  Procedure(s) Performed: ESOPHAGOGASTRODUODENOSCOPY (EGD) WITH PROPOFOL HOT HEMOSTASIS (ARGON PLASMA COAGULATION/BICAP) HEMOSTASIS CLIP PLACEMENT     Patient location during evaluation: PACU Anesthesia Type: MAC Level of consciousness: awake Pain management: pain level controlled Vital Signs Assessment: post-procedure vital signs reviewed and stable Respiratory status: spontaneous breathing Cardiovascular status: stable Postop Assessment: no apparent nausea or vomiting Anesthetic complications: no  No notable events documented.  Last Vitals:  Vitals:   04/30/22 0253 04/30/22 1039  BP: (!) 120/55 97/61  Pulse: 77 77  Resp: 16   Temp: 36.7 C 36.8 C  SpO2: 98% 100%    Last Pain:  Vitals:   04/30/22 1039  TempSrc: Oral  PainSc:                  Huston Foley

## 2022-04-30 NOTE — Telephone Encounter (Signed)
-----   Message from Yetta Flock, MD sent at 04/30/2022  9:22 AM EST ----- Regarding: outpatient follow up labs Jan can you help coordinate follow up CBC for this patient Monday or Tuesday of next week? Likely to be discharged later today. Thank you!

## 2022-04-30 NOTE — Telephone Encounter (Signed)
Called patient but her phone is not receiving calls.  Called Tomiko (Daughter) at [phone] and left a message regarding her mom needed to come to our office on Monday or Tuesday for CBC. Will also put letter in the mail today

## 2022-04-30 NOTE — Progress Notes (Addendum)
      Progress Note   Subjective  Doing well post EGD yesterday. No further vomiting or dark stools. Hgb has drifted but BUN down as well.   Objective   Vital signs in last 24 hours: Temp:  [97.2 F (36.2 C)-99.1 F (37.3 C)] 98.1 F (36.7 C) (03/06 0253) Pulse Rate:  [68-85] 77 (03/06 0253) Resp:  [15-25] 16 (03/06 0253) BP: (96-159)/(34-102) 120/55 (03/06 0253) SpO2:  [94 %-100 %] 98 % (03/06 0253) Last BM Date : 04/29/22 (per pt: "the previous bM was dark.") General:    AA female in NAD Neurologic:  Alert and oriented,  grossly normal neurologically. Psych:  Cooperative. Normal mood and affect.  Intake/Output from previous day: 03/05 0701 - 03/06 0700 In: 748.1 [I.V.:497.3; IV Piggyback:250.8] Out: -  Intake/Output this shift: No intake/output data recorded.  Lab Results: Recent Labs    04/29/22 0830 04/29/22 1832 04/30/22 0121  WBC 10.6* 10.6* 10.3  HGB 10.6* 8.9* 8.1*  HCT 32.1* 26.4* 24.8*  PLT 321 282 266   BMET Recent Labs    04/29/22 0830 04/30/22 0121  NA 138 137  K 3.6 3.6  CL 107 107  CO2 21* 23  GLUCOSE 132* 102*  BUN 21 12  CREATININE 0.81 0.65  CALCIUM 9.0 8.6*   LFT Recent Labs    04/29/22 0830  PROT 6.6  ALBUMIN 3.5  AST 19  ALT 13  ALKPHOS 65  BILITOT 0.6   PT/INR No results for input(s): "LABPROT", "INR" in the last 72 hours.  Studies/Results: No results found.     Assessment / Plan:    62 y/o female here with the following issues:  Upper GI bleed Gastric AVMs Post hemorrhagic anemia  Urgent EGD done yesterday afternoon. Gastric AVMs suspected to be the cause of her symptoms, one of which in the gastric body was friable. Ablated with APC and 2 hemostasis clips placed. She has not had any recurrent symptoms since the procedure. Hgb has drifted from yesterday PM however likely re-equilibration. BUN has normalized / decreased from yesterday.  Okay to advance to soft diet today. Would keep on protonix '40mg'$  twice daily  for the next month to promote healing after APC therapy. If she does well today can discharge later this afternoon, can repeat a Hgb to make sure stable. We will coordinate lab draw for her early next week for reassessment as well.  RECOMMEND: - advance to soft diet today - continue oral protonix '40mg'$  BID as outpatient for 1 month for APC therapy - can repeat Hgb later this afternoon to make sure stable, and discharge later today if so - will repeat Hgb as outpatient early next week - will coordinate outpatient follow up - needs repeat EGD at some point to remove duodenal polyp.  Call with questions, will sign off for now.  Jolly Mango, MD [facility]

## 2022-04-30 NOTE — Progress Notes (Signed)
     Daily Progress Note Intern Pager: [phone]  Patient name: [name] Medical record number: [id number] Date of birth: 1960/11/16 Age: 62 y.o. Gender: female  Primary Care Provider: Shary Key, DO Consultants: GI Code Status: Full code  Pt Overview and Major Events to Date:  3/5: Admitted to FMTS  Assessment and Plan: [name] is a 62 y.o. female presenting with dark stool, hematemesis, abdominal discomfort.  PMH HTN, diverticulosis, tubular adenoma, subarachnoid hemorrhage, COPD, tobacco use.  * Upper GI bleed EGD showed 3 non-bleeding and 1 bleeding angiodysplastic lesion in stomach and a few additional non-bleeding lesions in the duodenum all treated with APC. Hgb trending down from 10.6 to 8.1. Transfusion threshold of 7. -GI signed off -Protonix '40mg'$  BID per GI  -Advance diet as tolerated -PM CBC, potential d/c if stable  UTI (urinary tract infection) Reports dysuria and cloudy urine. UA positive for large leukocytes.  -Keflex '500mg'$  BID  Hypertension Some softer Bps overnight. -Home med [name] '50mg'$  daily   Chronic conditions: Tobacco use: on nicotine replacement   FEN/GI: Soft diet PPx: Held in setting of GI bleed Dispo: Home pending stability of anemia  Subjective:  Patient assessed at bedside states she is feeling hungry. Denies any bleeding per rectum or melanotic stool. States she does have abdominal pain with urination and urine appears cloudy.  Objective: Temp:  [97.2 F (36.2 C)-99.1 F (37.3 C)] 98.2 F (36.8 C) (03/06 1039) Pulse Rate:  [68-85] 77 (03/06 1039) Resp:  [16-22] 16 (03/06 0253) BP: (96-159)/(51-102) 97/61 (03/06 1039) SpO2:  [94 %-100 %] 100 % (03/06 1039) Physical Exam: General: NAD, laying in bed Cardiovascular: RRR without murmur Respiratory: CTAB. Normal WOB on RA Abdomen: Soft, tender to palpation in suprapubic region. +BS Extremities: No peripheral edema  Laboratory: Most recent CBC Lab Results   Component Value Date   WBC 9.2 04/30/2022   HGB 8.3 (L) 04/30/2022   HCT 24.4 (L) 04/30/2022   MCV 83.8 04/30/2022   PLT 257 04/30/2022   Most recent BMP    Latest Ref Rng & Units 04/30/2022    1:21 AM  BMP  Glucose 70 - 99 mg/dL 102   BUN 8 - 23 mg/dL 12   Creatinine 0.44 - 1.00 mg/dL 0.65   Sodium 135 - 145 mmol/L 137   Potassium 3.5 - 5.1 mmol/L 3.6   Chloride 98 - 111 mmol/L 107   CO2 22 - 32 mmol/L 23   Calcium 8.9 - 10.3 mg/dL 8.6     Other pertinent labs: UA: large leukocytes Lactate: 1.3   Colletta Maryland, MD 04/30/2022, 12:50 PM  PGY-1, [place] Intern pager: [phone], text pages welcome Secure chat group [place]

## 2022-04-30 NOTE — Discharge Summary (Signed)
[facility] Discharge Summary  Patient name: [name] Medical record number: [id number] Date of birth: 09/22/1960 Age: 62 y.o. Gender: female Date of Admission: 04/29/2022  Date of Discharge: 04/30/2022 Admitting Physician: Shary Key, DO  Primary Care Provider: Shary Key, DO Consultants: GI  Indication for Hospitalization: GI bleed  Discharge Diagnoses/Problem List:  Principal Problem for Admission: GI bleed Other Problems addressed during stay:  Principal Problem:   Upper GI bleed Active Problems:   Hypertension   Anemia, posthemorrhagic, acute   Gastric AVM   AVM (arteriovenous malformation) of small bowel, acquired   UTI (urinary tract infection)   GI bleed    Brief Hospital Course:  [name] is a 62 y.o.female with a history of HTN, diverticulosis, tubular adenoma, subarachnoid hemorrhage, COPD, tobacco use who was admitted to the [facility] at [facility] for dark stool, hematemesis, abdominal discomfort. Her hospital course is detailed below:  Upper GI bleed Admitted with melanotic stool x 2 days and an episode of bright red emesis. Hgb 10.6. GI consulted and started on Protonix drip. 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 Her Hgb was stable at 8.3 upon discharge. Plan to continue 1 month PPI and outpatient GI follow up next week to recheck CBC.  UTI Reported dysuria and cloudy urine. UA showed large leukocytes. Started on Keflex for UTI management.  Other chronic conditions were medically managed with home medications and formulary alternatives as necessary (HTN)  PCP Follow-up Recommendations:  Repeat Hgb in 1 week at GI outpatient follow up Continue protonix 40 mg bid for 1 month per GI Held Losartan due to soft BP during admission, BP check and consider  restarting  Disposition: Home  Discharge Condition: Stable  Discharge Exam:  Vitals:   04/30/22 0253 04/30/22 1039  BP: (!) 120/55 97/61  Pulse: 77 77  Resp: 16   Temp: 98.1 F (36.7 C) 98.2 F (36.8 C)  SpO2: 98% 100%   General: NAD, laying in bed Cardiovascular: RRR without murmur Respiratory: CTAB. Normal WOB on RA Abdomen: Soft, tender to palpation in suprapubic region. +BS Extremities: No peripheral edema  Significant Procedures: EGD with APC of multiple non-bleeding and one bleeding angiodysplastic lesions in the stomach and duodenum  Significant Labs and Imaging:  Recent Labs  Lab 04/30/22 0121 04/30/22 0835 04/30/22 1335  WBC 10.3 9.2 10.0  HGB 8.1* 8.3* 8.3*  HCT 24.8* 24.4* 25.9*  PLT 266 257 254   Recent Labs  Lab 04/29/22 0830 04/30/22 0121  NA 138 137  K 3.6 3.6  CL 107 107  CO2 21* 23  GLUCOSE 132* 102*  BUN 21 12  CREATININE 0.81 0.65  CALCIUM 9.0 8.6*  ALKPHOS 65  --   AST 19  --   ALT 13  --   ALBUMIN 3.5  --     Pertinent Imaging: None  Results/Tests Pending at Time of Discharge: None  Discharge Medications:  Allergies as of 04/30/2022       Reactions   Asa [aspirin] Nausea Only, Palpitations, Other (See Comments)   GI upset Tremors Reactions to '325mg'$  dose   Flagyl [metronidazole] Other (See Comments)   Yeast infections   Gluten Meal Nausea And Vomiting, Other (See Comments)   Bloating Flatulence   Milk-related Compounds Nausea And Vomiting, Other (See Comments)   Bloating Gas   Chantix [varenicline]  Other (See Comments)   Dizziness   Crestor [rosuvastatin] Other (See Comments)   Myalgias    Eggs Or Egg-derived Products Nausea Only   Fish Allergy Hives, Itching, Rash   Specifically tuna   Zocor [simvastatin] Rash        Medication List     STOP taking these medications    rosuvastatin 40 MG tablet Commonly known as: CRESTOR   spironolactone 50 MG tablet Commonly known as: ALDACTONE       TAKE these  medications    cephALEXin 500 MG capsule Commonly known as: KEFLEX Take 1 capsule (500 mg total) by mouth every 12 (twelve) hours for 6 days.   pantoprazole 40 MG tablet Commonly known as: Protonix Take 1 tablet (40 mg total) by mouth 2 (two) times daily.   PROBIOTIC PO Take 1 capsule by mouth at bedtime.   Spiriva Respimat 2.5 MCG/ACT Aers Generic drug: Tiotropium Bromide Monohydrate Inhale 2 puffs into the lungs daily. What changed:  when to take this reasons to take this   VITAMIN B-12 PO Take 1 tablet by mouth 3 (three) times daily.        Discharge Instructions: Please refer to Patient Instructions section of EMR for full details.  Patient was counseled important signs and symptoms that should prompt return to medical care, changes in medications, dietary instructions, activity restrictions, and follow up appointments.   Follow-Up Appointments:  Follow-up Information     Shary Key, DO. Go on 05/06/2022.   Specialty: Family Medicine Why: Appointment at 3:50 pm, please arrive at least 15 min prior to your scheduled appointment time. Contact information: [place] [address] [phone]                 Colletta Maryland, MD 05/01/2022, 7:26 AM PGY-1, [place]

## 2022-04-30 NOTE — Discharge Instructions (Addendum)
Dear [name],   Thank you for letting us participate in your care! In this section, you will find a brief hospital admission summary of why you were admitted to the hospital, what happened during your admission, your diagnosis/diagnoses, and recommended follow up.  Primary diagnosis: Upper GI bleed Treatment plan: You had a scope with the GI doctor that found some bleeding and non-bleeding lesions that were treated. You were started on acid suppression that you will continue for 1 month after you leave and follow up with the GI doctor outpatient.   POST-HOSPITAL & CARE INSTRUCTIONS We recommend following up with your PCP within 1 week from being discharged from the hospital. Please let PCP/Specialists know of any changes in medications that were made which you will be able to see in the medications section of this packet. Please also follow up with GI next week to check your hemoglobin level.  DOCTOR'S APPOINTMENTS & FOLLOW UP Future Appointments  Date Time Provider [place]  05/01/2022  3:30 PM Leavy Cella RPH-CPP [facility]  05/06/2022  3:50 PM Arby Barrette Weldon Picking, DO [facility] [place]     Thank you for choosing [facility]! Take care and be well!  [facility]  [place], [place] [address] [phone]

## 2022-04-30 NOTE — Assessment & Plan Note (Addendum)
Reports dysuria and cloudy urine. UA positive for large leukocytes.  -Keflex '500mg'$  BID

## 2022-04-30 NOTE — Progress Notes (Signed)
Discharge instructions reviewed with pt. Copy of instructions given to pt. Scripts were filled by [facility] and delivered to pt's room by [employer]. Pt verbalized understanding of instructions, appointments, and medications. Pt's daughter will be coming to pick her up (daughter waiting for her daughter to get off the bus to come here and pick up pt).  Daughter will call pt when she gets to the main entrance/entrance A.  Pt will d/c via wheelchair with belongings when her daughter arrives and be escorted by staff.

## 2022-05-01 ENCOUNTER — Encounter (HOSPITAL_COMMUNITY): Payer: Self-pay | Admitting: Gastroenterology

## 2022-05-01 ENCOUNTER — Other Ambulatory Visit (HOSPITAL_COMMUNITY): Payer: Self-pay

## 2022-05-01 ENCOUNTER — Ambulatory Visit (INDEPENDENT_AMBULATORY_CARE_PROVIDER_SITE_OTHER): Payer: Managed Care, Other (non HMO) | Admitting: Pharmacist

## 2022-05-01 ENCOUNTER — Telehealth: Payer: Self-pay

## 2022-05-01 VITALS — BP 122/72 | Wt 175.8 lb

## 2022-05-01 DIAGNOSIS — J4489 Other specified chronic obstructive pulmonary disease: Secondary | ICD-10-CM

## 2022-05-01 DIAGNOSIS — D62 Acute posthemorrhagic anemia: Secondary | ICD-10-CM | POA: Diagnosis not present

## 2022-05-01 DIAGNOSIS — I10 Essential (primary) hypertension: Secondary | ICD-10-CM

## 2022-05-01 DIAGNOSIS — Z720 Tobacco use: Secondary | ICD-10-CM | POA: Diagnosis not present

## 2022-05-01 NOTE — Assessment & Plan Note (Signed)
History of COPD continues to have moderate muscle cramping of calf muscles after switching inhaler from Trelegy to Spiriva (tiotropium). Reports she was asked to follow-up with PCP for blood work after hospitalization to investigate muscle cramps further. - Continue Spiriva (tiotropium) Respimat 2 inhalations once daily - Follow-up blood work with PCP

## 2022-05-01 NOTE — Patient Instructions (Addendum)
It was great to see you today! Congratulations on 3 days of no smoking - keep up the great work!  Medication Changes: Decrease the dose of your nicotine patch from 21 mg to 14 mg (Step 2) once daily for 14 days, then decrease to 7 mg patch (Step 1) once daily for 14 days.

## 2022-05-01 NOTE — Telephone Encounter (Signed)
Called and LM for patient to go to the lab for CBC on Monday or Tuesday. Order is in

## 2022-05-01 NOTE — Assessment & Plan Note (Signed)
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  Appears to be an excellent candidate for success because of progress with quitting and current level of motivation.  She has made additional progress since last visit, reports remaining abstinent since she was hospitalized (~3 days). Prior to hospitalization, was smoking ~4 cigarettes per day. States varenicline gave her nausea and would like to continue only with nicotine patches. -Continued nicotine replacement tx with nicotine 21 mg patch daily. Patient counseled on purpose, proper use, and potential adverse effects. - Counseled patient to begin tapering nicotine patch: decrease from 21 mg to 14 mg patch once daily for 14 days, then decrease to 7 mg patch once daily

## 2022-05-01 NOTE — Progress Notes (Signed)
S:   Chief Complaint  Patient presents with   Medication Management    Tobacco use   [name] is a 62 y.o. female who presents for evaluation/assistance with tobacco dependence.  PMH is significant for COPD, recently hospitalized for GI bleed.  Patient was referred and last seen by Primary Care Provider, Dr. Arby Barrette, on 03/11/2022.   At last visit, patient complaining of leg cramps and inhaler was switched from Trelegy to Spiriva. Patient started varenicline with the goal of being abstinent for three days by next visit.   Patient reports being hospitalized recently for a GI bleed and was given a 21 mg nicotine patch (on 04/29/2022). She states she hasn't smoked since she started the patch. Last week, patient states she was smoking 4 cigarettes per day. Reports her breathing has improved - she feels like she can go a few days without Spiriva inhaler before developing shortness of breath. States varenicline, which was stopped when she was hospitalized, gave her nausea even while taking it with food. Patient has not restarted varenicline at time of office visit. She would like to continue with the patch.   Medications used in past cessation efforts include: varenicline  Rates CONFIDENCE for remaining abstinent for the next 30 days on 1-10 scale at 10.  Most common triggers to use tobacco include; stress   Motivation to quit: breathing  O: Clinical ASCVD: No  The 10-year ASCVD risk score (Arnett DK, et al., 2019) is: 9.5%   Values used to calculate the score:     Age: 39 years     Sex: Female     Is Non-Hispanic African American: Yes     Diabetic: No     Tobacco smoker: Yes     Systolic Blood Pressure: 97 mmHg     Is BP treated: No     HDL Cholesterol: 38 mg/dL     Total Cholesterol: 286 mg/dL  Review of Systems  Musculoskeletal:        Reports cramping in calves    Physical Exam Constitutional:      Appearance: Normal appearance.  Pulmonary:     Effort: Pulmonary  effort is normal.  Neurological:     Mental Status: She is alert.  Psychiatric:        Mood and Affect: Mood normal.        Behavior: Behavior normal.        Thought Content: Thought content normal.    A/P: Tobacco use disorder with long history of nicotine dependence.  Appears to be an excellent candidate for success because of progress with quitting and current level of motivation.  She has made additional progress since last visit, reports remaining abstinent since she was hospitalized (~3 days). Prior to hospitalization, was smoking ~4 cigarettes per day. States varenicline gave her nausea and would like to continue only with nicotine patches. -Continued nicotine replacement tx with nicotine 21 mg patch daily. Patient counseled on purpose, proper use, and potential adverse effects. - Counseled patient to begin tapering nicotine patch: decrease from 21 mg to 14 mg patch once daily for 14 days, then decrease to 7 mg patch once daily  History of COPD continues to have moderate muscle cramping of calf muscles after switching inhaler from Trelegy to Spiriva (tiotropium). Reports she was asked to follow-up with PCP for blood work after hospitalization to investigate muscle cramps further. - Continue Spiriva (tiotropium) Respimat 2 inhalations once daily - Follow-up blood work with PCP  Written patient instructions  provided. Patient verbalized understanding of treatment plan.  Total time in face to face counseling 25 minutes.    Follow-up:  Pharmacist in one month. PCP clinic visit in one week Patient seen with Louanne Belton PharmD PGY-1 Pharmacy Resident  and Joseph Art, PharmD, PGY2 Pharmacy Resident.

## 2022-05-01 NOTE — Transitions of Care (Post Inpatient/ED Visit) (Signed)
   05/01/2022  Name: [name] MRN: [id number] DOB: October 10, 1960  Today's TOC FU Call Status: Today's TOC FU Call Status:: Successful TOC FU Call Competed TOC FU Call Complete Date: 05/01/22  Transition Care Management Follow-up Telephone Call Date of Discharge: 04/30/22 Discharge Facility: [facility]) Type of Discharge: Inpatient Admission Primary Inpatient Discharge Diagnosis:: GI Bleed How have you been since you were released from the hospital?: Better Any questions or concerns?: No  Items Reviewed: Did you receive and understand the discharge instructions provided?: Yes Medications obtained and verified?: Yes (Medications Reviewed) Any new allergies since your discharge?: No Dietary orders reviewed?: Yes Do you have support at home?: Yes People in Home: grandchild(ren)  Home Care and Equipment/Supplies: [place] Ordered?: NA Any new equipment or medical supplies ordered?: NA  Functional Questionnaire: Do you need assistance with bathing/showering or dressing?: No Do you need assistance with meal preparation?: No Do you need assistance with eating?: No Do you have difficulty maintaining continence: No Do you need assistance with getting out of bed/getting out of a chair/moving?: No Do you have difficulty managing or taking your medications?: No  Folllow up appointments reviewed: PCP Follow-up appointment confirmed?: Yes Date of PCP follow-up appointment?: 05/06/22 Follow-up Provider: [facility] Follow-up appointment confirmed?: Yes Date of Specialist follow-up appointment?: 05/01/22 Follow-Up Specialty Provider:: Pulmo Do you need transportation to your follow-up appointment?: No Do you understand care options if your condition(s) worsen?: Yes-patient verbalized understanding    [place], [place] Direct Dial [phone]

## 2022-05-02 ENCOUNTER — Other Ambulatory Visit (INDEPENDENT_AMBULATORY_CARE_PROVIDER_SITE_OTHER): Payer: Managed Care, Other (non HMO)

## 2022-05-02 DIAGNOSIS — K922 Gastrointestinal hemorrhage, unspecified: Secondary | ICD-10-CM | POA: Diagnosis not present

## 2022-05-02 LAB — CBC
Hematocrit: 27.6 % — ABNORMAL LOW (ref 34.0–46.6)
Hemoglobin: 8.7 g/dL — ABNORMAL LOW (ref 11.1–15.9)
MCH: 27.6 pg (ref 26.6–33.0)
MCHC: 31.5 g/dL (ref 31.5–35.7)
MCV: 88 fL (ref 79–97)
Platelets: 298 10*3/uL (ref 150–450)
RBC: 3.15 x10E6/uL — ABNORMAL LOW (ref 3.77–5.28)
RDW: 14.2 % (ref 11.7–15.4)
WBC: 9.4 10*3/uL (ref 3.4–10.8)

## 2022-05-02 LAB — CBC WITH DIFFERENTIAL/PLATELET
Basophils Absolute: 0.2 10*3/uL — ABNORMAL HIGH (ref 0.0–0.1)
Basophils Relative: 1.4 % (ref 0.0–3.0)
Eosinophils Absolute: 0.2 10*3/uL (ref 0.0–0.7)
Eosinophils Relative: 1.5 % (ref 0.0–5.0)
HCT: 27 % — ABNORMAL LOW (ref 36.0–46.0)
Hemoglobin: 9 g/dL — ABNORMAL LOW (ref 12.0–15.0)
Lymphocytes Relative: 36.5 % (ref 12.0–46.0)
Lymphs Abs: 3.8 10*3/uL (ref 0.7–4.0)
MCHC: 33.5 g/dL (ref 30.0–36.0)
MCV: 85.6 fl (ref 78.0–100.0)
Monocytes Absolute: 0.5 10*3/uL (ref 0.1–1.0)
Monocytes Relative: 5 % (ref 3.0–12.0)
Neutro Abs: 5.8 10*3/uL (ref 1.4–7.7)
Neutrophils Relative %: 55.6 % (ref 43.0–77.0)
Platelets: 301 10*3/uL (ref 150.0–400.0)
RBC: 3.15 Mil/uL — ABNORMAL LOW (ref 3.87–5.11)
RDW: 14.8 % (ref 11.5–15.5)
WBC: 10.5 10*3/uL (ref 4.0–10.5)

## 2022-05-02 LAB — BASIC METABOLIC PANEL
BUN/Creatinine Ratio: 10 — ABNORMAL LOW (ref 12–28)
BUN: 9 mg/dL (ref 8–27)
CO2: 23 mmol/L (ref 20–29)
Calcium: 9.4 mg/dL (ref 8.7–10.3)
Chloride: 105 mmol/L (ref 96–106)
Creatinine, Ser: 0.89 mg/dL (ref 0.57–1.00)
Glucose: 93 mg/dL (ref 70–99)
Potassium: 4.1 mmol/L (ref 3.5–5.2)
Sodium: 143 mmol/L (ref 134–144)
eGFR: 73 mL/min/{1.73_m2} (ref >59)

## 2022-05-02 NOTE — Progress Notes (Signed)
Reviewed and agree with Dr Koval's plan.   

## 2022-05-06 ENCOUNTER — Other Ambulatory Visit: Payer: Self-pay

## 2022-05-06 ENCOUNTER — Ambulatory Visit (INDEPENDENT_AMBULATORY_CARE_PROVIDER_SITE_OTHER): Payer: Managed Care, Other (non HMO) | Admitting: Family Medicine

## 2022-05-06 ENCOUNTER — Encounter: Payer: Self-pay | Admitting: Family Medicine

## 2022-05-06 VITALS — BP 117/58 | HR 84 | Ht 64.5 in | Wt 180.4 lb

## 2022-05-06 DIAGNOSIS — K31811 Angiodysplasia of stomach and duodenum with bleeding: Secondary | ICD-10-CM | POA: Diagnosis not present

## 2022-05-06 DIAGNOSIS — I10 Essential (primary) hypertension: Secondary | ICD-10-CM

## 2022-05-06 DIAGNOSIS — I517 Cardiomegaly: Secondary | ICD-10-CM

## 2022-05-06 DIAGNOSIS — K922 Gastrointestinal hemorrhage, unspecified: Secondary | ICD-10-CM

## 2022-05-06 IMAGING — CT CT CHEST LUNG CANCER SCREENING LOW DOSE W/O CM
1 series · 10 of 10 positions shown, 13 images · non-contrast
Comparison: CTA chest 09/08/2010.

CLINICAL DATA: 59-year-old female with 20 pack-year history of
smoking. Lung cancer screening.

EXAM:
CT CHEST WITHOUT CONTRAST LOW-DOSE FOR LUNG CANCER SCREENING
TECHNIQUE: Multidetector CT imaging of the chest was performed following the
standard protocol without IV contrast.

[ct lung segmentation data · axial · 0.61mm/px · z∈[-279,-279]mm · 10 of 282 frames shown]
[frame 1/282  mediastinal]
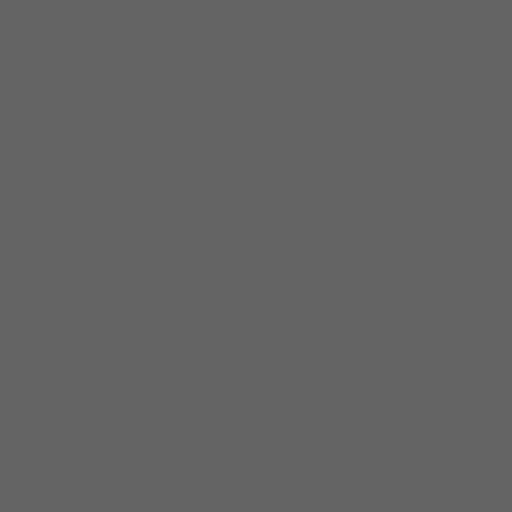
[frame 1/282  lung]
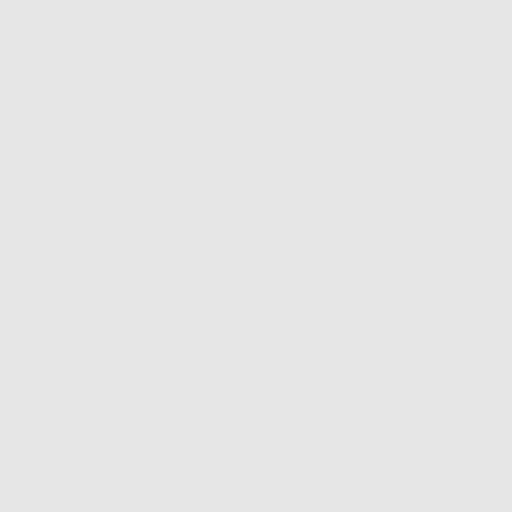
[frame 32/282  lung]
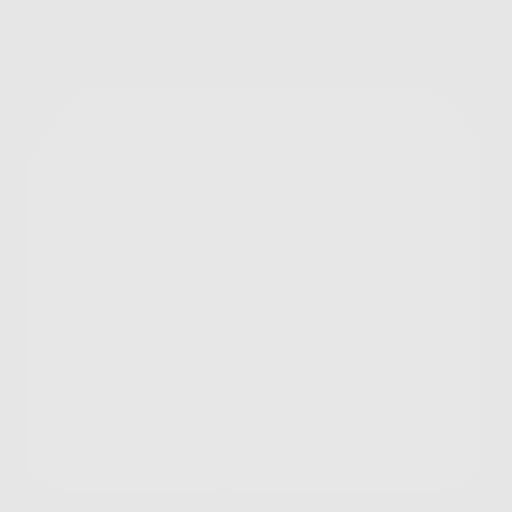
[frame 63/282  lung]
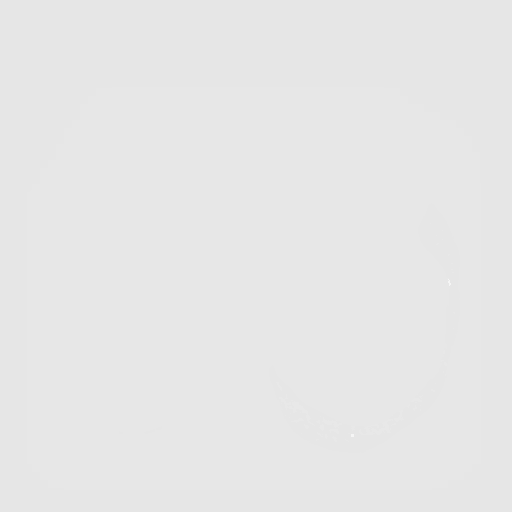
[frame 94/282  lung]
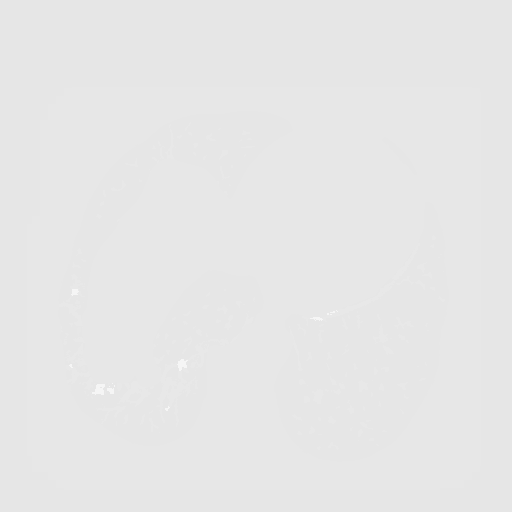
[frame 125/282  mediastinal]
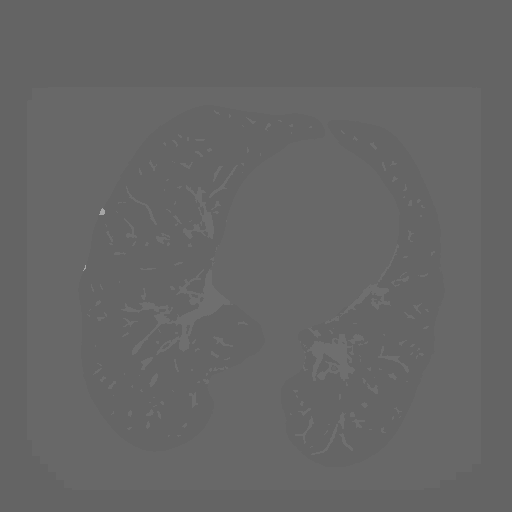
[frame 125/282  lung]
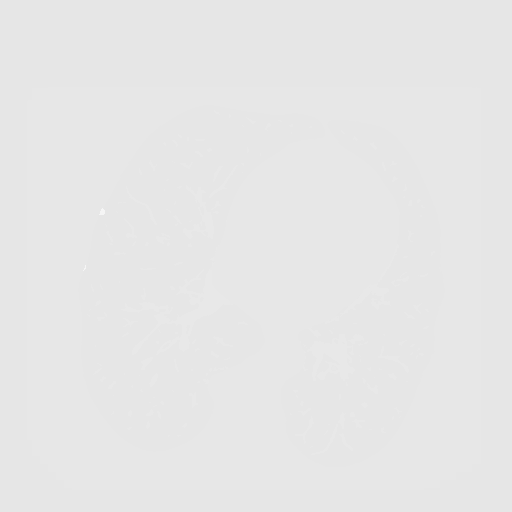
[frame 157/282  lung]
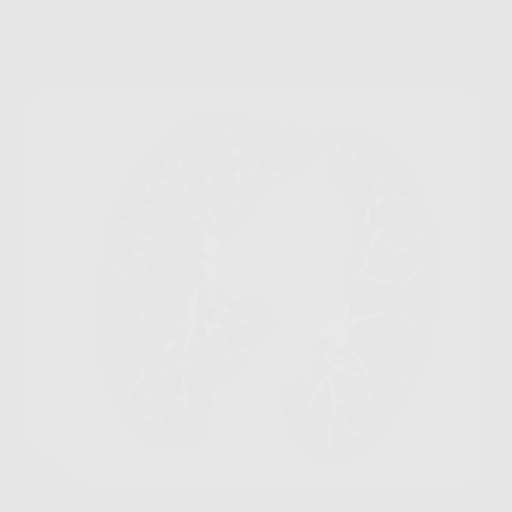
[frame 188/282  lung]
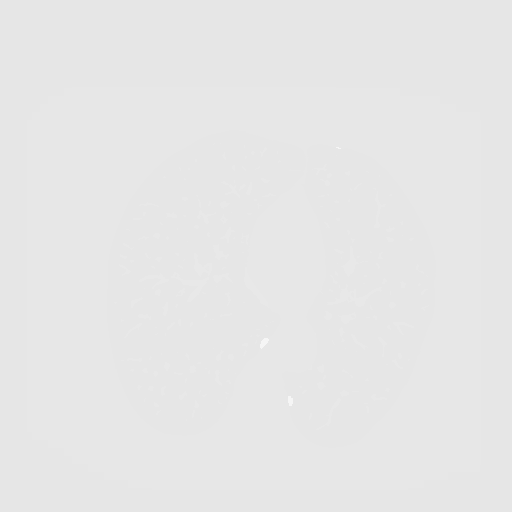
[frame 219/282  lung]
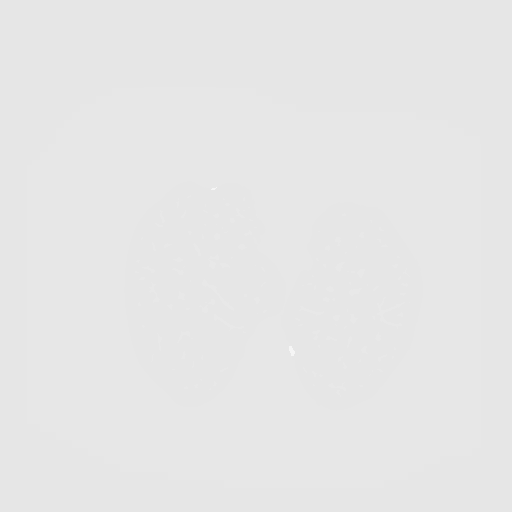
[frame 250/282  mediastinal]
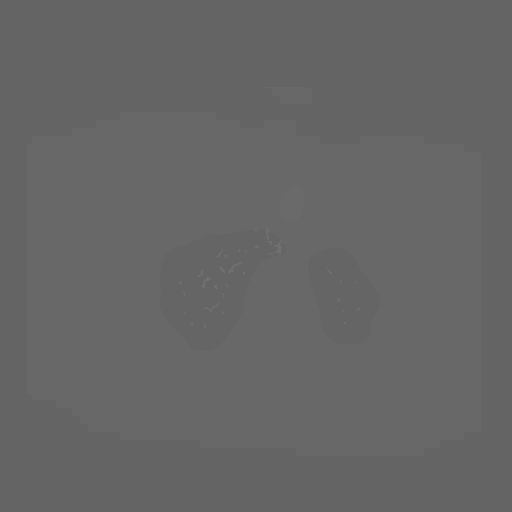
[frame 250/282  lung]
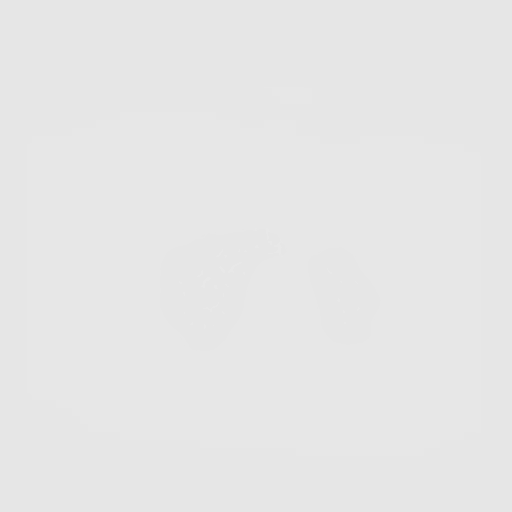
[frame 282/282  lung]
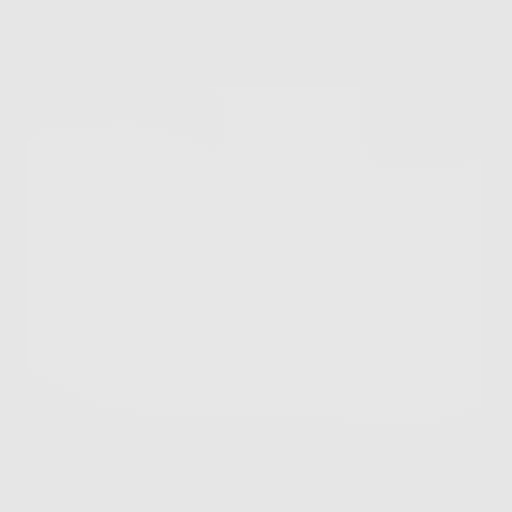

[10 of 10 positions shown; findings below may reference images not displayed]

FINDINGS: Cardiovascular: The heart size is normal. No substantial pericardial
effusion. Coronary artery calcification is evident. Atherosclerotic
calcification is noted in the wall of the thoracic aorta.

Mediastinum/Nodes: No mediastinal lymphadenopathy. No evidence for
gross hilar lymphadenopathy although assessment is limited by the
lack of intravenous contrast on today's study. The esophagus has
normal imaging features. There is no axillary lymphadenopathy.

Lungs/Pleura: Centrilobular and paraseptal emphysema evident.
Scattered bilateral calcified and noncalcified pulmonary nodules are
evident, measuring up to 5.3 mm in the posterior right costophrenic
sulcus (image 199). No overtly suspicious pulmonary nodule or mass.
No focal airspace consolidation. No pleural effusion.

Upper Abdomen: Tiny nonobstructing stone noted upper pole right
kidney.

Musculoskeletal: No worrisome lytic or sclerotic osseous
abnormality.
IMPRESSION: 1. Lung-RADS 2, benign appearance or behavior. Continue annual
screening with low-dose chest CT without contrast in 12 months.
2. Nonobstructing stone upper pole right kidney.
3.  Emphysema (B9MCC-YX1.5) and Aortic Atherosclerosis (B9MCC-170.0)

## 2022-05-06 NOTE — Progress Notes (Signed)
    SUBJECTIVE:   CHIEF COMPLAINT / HPI:   Patient presents for hospital follow up. She was hospitalized from 3/5-3/6 for GI bleed. Hospital course below:  Upper GI bleed: Admitted with melanotic stool x 2 days and an episode of bright red emesis. Hgb 10.6. GI consulted and started on Protonix drip. 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 Her Hgb was stable at 8.3 upon discharge. Plan to continue 1 month PPI and outpatient GI follow up next week to recheck CBC. UTI: Reported dysuria and cloudy urine. UA showed large leukocytes. Started on Keflex for UTI management.   Today states when she went to the restroom saw a little spot of blood. No other bleeding since hospitalization. Denies dizziness, syncope. Does state that sometimes when she is sitting down feels her head pulsing behind her eyes- Ophthalmologist states she has elevated eye pressure. States she read up on the side effects of her breathign treatemnt and read that it could be a cause.  Feels diltiazem did not help her blodo pressure, does not want to take it Open to discussion with Dr. Valentina Lucks about BP    PERTINENT  PMH / PSH: Reviewed   OBJECTIVE:   BP (!) 117/58   Pulse 84   Ht 5' 4.5" (1.638 m)   Wt 180 lb 6.4 oz (81.8 kg)   SpO2 100%   BMI 30.49 kg/m    General: alert, pleasant, NAD CV: RRR, murmur present Resp: CTAB normal WOB GI: soft, non distended, non tender to palpation Neuro: CN 2-12 in tact. Normal sensation  ASSESSMENT/PLAN:   Upper GI bleed Patient was hopsitalized 3/5-3/6 due to melanotic stool and bright red emesis, hgb 10.6. 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 Her Hgb was stable at 8.3 upon discharge. Hgb 4 days ago was 9. She continues to take protonix  40mg  BID for the duration of the month. Will follow up with GI in about 1-2 months per GI result note. Will check CBC today and iron studies.  - CBC and iron studies   Hypertension BP 170/63 initially and then on repeat 117/58. BP soft during hospitalization so Losartan was held and she has not resumed it. Advised her to continue holding. Will continue to monitor. Also with severe concentric hypertrophy on echo in 2022, and murmur on exam. Previously followed with cardiology but would like referral to a different cardiologist - Cardiology referral placed     Shary Key, [place]

## 2022-05-06 NOTE — Assessment & Plan Note (Signed)
Patient was hopsitalized 3/5-3/6 due to melanotic stool and bright red emesis, hgb 10.6. 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 Her Hgb was stable at 8.3 upon discharge. Hgb 4 days ago was 9. She continues to take protonix '40mg'$  BID for the duration of the month. Will follow up with GI in about 1-2 months per GI result note. Will check CBC today and iron studies.  - CBC and iron studies

## 2022-05-06 NOTE — Patient Instructions (Addendum)
It was great seeing you today!  You came in to follow up on your recent hospitalization. I am glad you are feeling better!   Continue your protonix daily GI will reach out to you to see you in the next 1-2 months   I have also referred to a different cardiologist, they will call you to schedule an appointment  Feel free to call with any questions or concerns at any time, at [phone].   Take care,  Dr. Shary Key [facility]

## 2022-05-06 NOTE — Assessment & Plan Note (Signed)
BP 170/63 initially and then on repeat 117/58. BP soft during hospitalization so Losartan was held and she has not resumed it. Advised her to continue holding. Will continue to monitor. Also with severe concentric hypertrophy on echo in 2022, and murmur on exam. Previously followed with cardiology but would like referral to a different cardiologist - Cardiology referral placed

## 2022-05-07 ENCOUNTER — Other Ambulatory Visit: Payer: Self-pay | Admitting: Family Medicine

## 2022-05-07 LAB — CBC WITH DIFFERENTIAL/PLATELET
Basophils Absolute: 0 10*3/uL (ref 0.0–0.2)
Basos: 1 %
EOS (ABSOLUTE): 0.1 10*3/uL (ref 0.0–0.4)
Eos: 2 %
Hematocrit: 27.5 % — ABNORMAL LOW (ref 34.0–46.6)
Hemoglobin: 8.8 g/dL — ABNORMAL LOW (ref 11.1–15.9)
Immature Grans (Abs): 0 10*3/uL (ref 0.0–0.1)
Immature Granulocytes: 0 %
Lymphocytes Absolute: 2.8 10*3/uL (ref 0.7–3.1)
Lymphs: 36 %
MCH: 27.8 pg (ref 26.6–33.0)
MCHC: 32 g/dL (ref 31.5–35.7)
MCV: 87 fL (ref 79–97)
Monocytes Absolute: 0.5 10*3/uL (ref 0.1–0.9)
Monocytes: 6 %
Neutrophils Absolute: 4.3 10*3/uL (ref 1.4–7.0)
Neutrophils: 55 %
Platelets: 354 10*3/uL (ref 150–450)
RBC: 3.16 x10E6/uL — ABNORMAL LOW (ref 3.77–5.28)
RDW: 13.7 % (ref 11.7–15.4)
WBC: 7.8 10*3/uL (ref 3.4–10.8)

## 2022-05-07 LAB — IRON,TIBC AND FERRITIN PANEL
Ferritin: 15 ng/mL (ref 15–150)
Iron Saturation: 5 % — CL (ref 15–55)
Iron: 18 ug/dL — ABNORMAL LOW (ref 27–139)
Total Iron Binding Capacity: 374 ug/dL (ref 250–450)
UIBC: 356 ug/dL (ref 118–369)

## 2022-05-07 MED ORDER — FERROUS SULFATE 324 (65 FE) MG PO TBEC: 324.0000 mg | DELAYED_RELEASE_TABLET | ORAL | 3 refills | Status: AC

## 2022-05-07 NOTE — Progress Notes (Signed)
Iron levels low, called patient to discuss. Discussed options of oral iron vs IV iron and she opted for oral. Will start ferrous sulfate every other day. Orders placed

## 2022-05-29 ENCOUNTER — Ambulatory Visit: Payer: Managed Care, Other (non HMO) | Admitting: Pharmacist

## 2022-05-29 ENCOUNTER — Encounter: Payer: Self-pay | Admitting: Pharmacist

## 2022-05-29 VITALS — BP 138/70 | Wt 176.8 lb

## 2022-05-29 DIAGNOSIS — Z720 Tobacco use: Secondary | ICD-10-CM | POA: Diagnosis not present

## 2022-05-29 DIAGNOSIS — T466X5A Adverse effect of antihyperlipidemic and antiarteriosclerotic drugs, initial encounter: Secondary | ICD-10-CM

## 2022-05-29 DIAGNOSIS — G720 Drug-induced myopathy: Secondary | ICD-10-CM | POA: Insufficient documentation

## 2022-05-29 NOTE — Patient Instructions (Signed)
Tobacco Patient Instructions  Quitting smoking is one of the most important decisions you can make for your current and future health. Consider what you dislike about smoking and how quitting could personally benefit you. Try to cut down. Aim for reducing the amount you smoke to 3 cigarettes over the next 3 weeks.  Starting today, Be a Quitter!  Remind yourself why you want to quit.  Delay your first cigarette of the day for as long as possible.  Start cleaning out all pockets, drawers, and your car of cigarettes.  Getting Through the Cravings Once You Are Smoke Free: Each craving will last about 10 minutes, whether or not you smoke. Here's how to get through the cravings without cigarettes:  DELAY: Tell yourself that you'll wait for the next craving. Do it every time! DEEP BREATHS: One reason smoking feels good is because you breathe in deeply to inhale. Take four slow, deep breaths and feel the relaxation without the hamful effects of cigarettes. DRINK WATER: Drink a glass of cool water. It will give your hands and mouth something to do and will help flush the nicotine out of your system faster. DIVERT: Do something else -- brush your teeth, take a walk, call a friend who can offer you support. Just moving onto something other than thinking about cigarettes will move you through the craving.   Frequently Asked Questions  What can I do when I get the urge to smoke? To get through the urge to smoke, try the following:  Review your reasons for quitting and think of all the benefits to your health, your finances, and your family.  Remind yourself that there is no such thing as just one cigarette -- or even one puff.  Ride out the desire to smoke. Use the 4 Os -- Delay, Deep Breaths, Drink Water and Divert to get you through. The craving will go away eventually. Do not fool yourself into thinking you can have just one cigarette.  Any tips on how to deal with stress? Stress is a natural part  of life. The key is to deal with it without reaching for a cigarette. Taking deep breaths, counting backwards from 10 and asking yourself 1-how big a deal is this?"  Writing down your feelings, talking with a friend and doing things like positive self-talk and meditation are some other ways that people deal with daily stress.  What if I start smoking again? Slips happen. Most people try to quit smoking a few times before they are successful. Don't beat yourself up if this happens to you! Ask yourself if this was a slip or a relapse. A slip is a one-time mistake that is quickly corrected. A relapse is going back to your old smoking habits.   If you slip, don't give up. Think of it as a learning experience. Ask yourself what went wrong and renew your commitment to staying away from smoking for good.  If you relapse, try not to get discouraged. Ask yourself the question "What caused me to start smoking?" Figure out what helped you and what didn't when you tried to quit. Knowing why you relapsed is useful information for your next attempt to quit.

## 2022-05-29 NOTE — Progress Notes (Signed)
   S:  Chief Complaint  Patient presents with   Medication Management    Tobacco intake reduction   [name] is a 62 y.o. female who presents for evaluation/assistance with tobacco dependence.  PMH is significant for COPD, recently hospitalized for GI bleed.  Patient was referred and last seen by Primary Care Provider, Dr. Arby Barrette, on 03/11/2022.   Patient arrives in good spirits and presents without assistance. She is cutting her 21 mg patch in half.  However, Nicoderm has caused skin irritation. She reports she recently got a promotion which has caused more stress. She reports her breathing has improved; although she is not using her inhaler daily.   Age when started using tobacco on a daily basis at age 46- increased a lot when she was about 35. Brand smoked [place] Light 100.  Current tobacco use: 4 cigarettes/day (morning, lunch, drive home, and before bed) Estimated nicotine content per cigarette (mg) ~ 1 mg Estimated nicotine intake per day < 10mg .   Denies waking to smoke  Medications used in past cessation efforts include: varenicline, NRT (caused bumps/irritation)  Rates CONFIDENCE for remaining abstinent for the next 30 days on 1-10 scale at 10.  Most common triggers to use tobacco include; stress   Motivation to quit: breathing  O: Clinical ASCVD: No  The ASCVD Risk score (Arnett DK, et al., 2019) failed to calculate for the following reasons:   The patient has a prior MI or stroke diagnosis  Review of Systems  All other systems reviewed and are negative.   Physical Exam Constitutional:      Appearance: Normal appearance.  Pulmonary:     Effort: Pulmonary effort is normal.  Neurological:     Mental Status: She is alert.  Psychiatric:        Mood and Affect: Mood normal.        Behavior: Behavior normal.        Thought Content: Thought content normal.    A/P: Tobacco use disorder with long history of nicotine dependence.  Appears to be an excellent  candidate for success because of progress with quitting and current level of motivation. Tobacco intake is unchanged since last visit (4 cigarettes/day). Discussed giving up cigarette in the morning or before bed. Patient would prefer to stop the nicotine patches.  -Stop nicotine patch Using 1/2 of 21mg  patch) daily per patient preference. -Set goal of reducing from 4 to 3 cigarettes/day  Statin Myopathy - Multiple statin intolerance  -Denies interest in attempting any statin  Written patient instructions provided. Patient verbalized understanding of treatment plan.  Total time in face to face counseling 25 minutes.    Follow-up:  Pharmacist in one month. PCP clinic visit in one week Patient seen with Adrian Blackwater PGY-1 Pharmacy Resident, Estelle June, PharmD Candidate and Joseph Art, PharmD, PGY2 Pharmacy Resident.

## 2022-05-29 NOTE — Progress Notes (Signed)
Reviewed and agree with Dr Koval's plan.   

## 2022-05-29 NOTE — Assessment & Plan Note (Signed)
Statin Myopathy - Multiple statin intolerance  -Denies interest in attempting any statin

## 2022-06-23 ENCOUNTER — Ambulatory Visit: Payer: Managed Care, Other (non HMO) | Admitting: Pharmacist

## 2022-06-23 ENCOUNTER — Other Ambulatory Visit: Payer: Self-pay

## 2022-06-23 DIAGNOSIS — J4489 Other specified chronic obstructive pulmonary disease: Secondary | ICD-10-CM

## 2022-06-24 MED ORDER — SPIRIVA RESPIMAT 2.5 MCG/ACT IN AERS: 2.0000 | INHALATION_SPRAY | Freq: Every day | RESPIRATORY_TRACT | 6 refills | Status: DC

## 2022-07-01 ENCOUNTER — Encounter: Payer: Self-pay | Admitting: Pharmacist

## 2022-07-01 ENCOUNTER — Ambulatory Visit: Payer: Managed Care, Other (non HMO) | Admitting: Pharmacist

## 2022-07-01 VITALS — BP 125/78 | HR 73 | Ht 65.0 in | Wt 176.0 lb

## 2022-07-01 DIAGNOSIS — Z720 Tobacco use: Secondary | ICD-10-CM | POA: Diagnosis not present

## 2022-07-01 NOTE — Patient Instructions (Addendum)
It was nice to see you today!  Congratulations on quitting!  You have made it FOUR days!

## 2022-07-01 NOTE — Assessment & Plan Note (Signed)
Tobacco use disorder with long history of nicotine dependence who has quit for 4 days.  Appears to be an excellent candidate for success because of progress with quitting and current level of motivation.  -Currently not using any tobacco cessation therapy.  -Discussed importance of motivation and continued tobacco cessation.  -Goal set to continue 0 cigarettes per day.

## 2022-07-01 NOTE — Progress Notes (Signed)
     S:   Chief Complaint  Patient presents with   Nicotine Dependence    Tobacco cessation assistance/evaluation   [name] is a 62 y.o. female who presents for evaluation/assistance with tobacco dependence.  PMH is significant for nicotine dependence.  Patient was referred and last seen by Primary Care Provider, Dr. Franchot Erichsen, on 05/06/2022.  Patient was last seen by Madelon Lips, pharmacist, on 05/29/2022.  At last visit, patient discussed giving up cigarette in the morning or before bed. Nicotine patches discontinued and a goal of reducing from 4 to 3 cigarettes/day was discussed.   Age when started using tobacco on a daily basis 20. Brand smoked [place] Light 100. Number of cigarettes/day: 4 cigarettes/day (morning, lunch, drive home, and before bed) but is currently at 0 cigarettes per day in the past 4 days.  Denies waking to smoke.    Fagerstrom Score Question Scoring Patient Score  How soon after waking do you smoke your first cigarette? <5 mins (3) 5-30 mins (2) 31-60 mins (1) >60 mins (0) 0  Do you find it difficult NOT to smoke in places where you shouldn't? Yes(1) No (0) 0  Which cigarette would you most hate to give up? First one in AM (1) Any other one (0) 0   How many cigarettes do you smoke/day? 10 or less (0) 11-20 (1) 21-30 (2) >30 (3) 0  Do you smoke more during the first few hours after waking? Yes (1) No (0) 0  Do you smoke if you are so ill you cannot get out of bed? Yes (1) No (0) 0    Total Score   0  Score interpretation: low 1-2, low-to-moderate 3-4, moderate 5-7, high >7  Most recent quit attempt 06/27/2022. Patient states she has not had a cigarette in 4 days.  Longest time ever been tobacco free 3 months.  Medications used in past cessation efforts include: varenicline, NRT (caused bumps/irritation) - nicotine patch.   Most common triggers to use tobacco include: stress.   Motivation to quit: breathing  O: Clinical ASCVD: No   The ASCVD Risk score (Arnett DK, et al., 2019) failed to calculate for the following reasons:   The patient has a prior MI or stroke diagnosis   ROS: no complaints Physical Exam: patient fairing well. Reviewed vitals and no significant values. Patient reports continued breathing issues.    A/P: Tobacco use disorder with long history of nicotine dependence who has quit for 4 days.  Appears to be an excellent candidate for success because of progress with quitting and current level of motivation.  -Currently not using any tobacco cessation therapy.  -Discussed importance of motivation and continued tobacco cessation.  -Goal set to continue 0 cigarettes per day.    Written patient instructions provided. Patient verbalized understanding of treatment plan.  Total time in face to face counseling 17 minutes.    Follow-up:  Pharmacist in one month. PCP clinic visit with PCP in 4-7 weeks. Patient seen with Bing Plume, PharmD Candidate.

## 2022-07-07 NOTE — Progress Notes (Signed)
Reviewed and agree with Dr Koval's plan.   

## 2022-07-31 ENCOUNTER — Ambulatory Visit: Payer: Managed Care, Other (non HMO) | Admitting: Pharmacist

## 2022-08-11 ENCOUNTER — Ambulatory Visit: Payer: Managed Care, Other (non HMO) | Admitting: Gastroenterology

## 2022-08-11 ENCOUNTER — Encounter: Payer: Self-pay | Admitting: Gastroenterology

## 2022-08-11 ENCOUNTER — Other Ambulatory Visit (INDEPENDENT_AMBULATORY_CARE_PROVIDER_SITE_OTHER): Payer: Managed Care, Other (non HMO)

## 2022-08-11 VITALS — BP 128/80 | HR 76 | Ht 65.0 in | Wt 180.0 lb

## 2022-08-11 DIAGNOSIS — K6389 Other specified diseases of intestine: Secondary | ICD-10-CM

## 2022-08-11 DIAGNOSIS — K31819 Angiodysplasia of stomach and duodenum without bleeding: Secondary | ICD-10-CM

## 2022-08-11 DIAGNOSIS — K5520 Angiodysplasia of colon without hemorrhage: Secondary | ICD-10-CM | POA: Diagnosis not present

## 2022-08-11 DIAGNOSIS — D509 Iron deficiency anemia, unspecified: Secondary | ICD-10-CM

## 2022-08-11 DIAGNOSIS — Z860100 Personal history of colon polyps, unspecified: Secondary | ICD-10-CM

## 2022-08-11 LAB — CBC WITH DIFFERENTIAL/PLATELET
Basophils Absolute: 0.1 10*3/uL (ref 0.0–0.1)
Basophils Relative: 0.9 % (ref 0.0–3.0)
Eosinophils Absolute: 0.1 10*3/uL (ref 0.0–0.7)
Eosinophils Relative: 1.3 % (ref 0.0–5.0)
HCT: 36.4 % (ref 36.0–46.0)
Hemoglobin: 11.6 g/dL — ABNORMAL LOW (ref 12.0–15.0)
Lymphocytes Relative: 46.8 % — ABNORMAL HIGH (ref 12.0–46.0)
Lymphs Abs: 3.8 10*3/uL (ref 0.7–4.0)
MCHC: 31.9 g/dL (ref 30.0–36.0)
MCV: 72.4 fl — ABNORMAL LOW (ref 78.0–100.0)
Monocytes Absolute: 0.5 10*3/uL (ref 0.1–1.0)
Monocytes Relative: 5.8 % (ref 3.0–12.0)
Neutro Abs: 3.7 10*3/uL (ref 1.4–7.7)
Neutrophils Relative %: 45.2 % (ref 43.0–77.0)
Platelets: 362 10*3/uL (ref 150.0–400.0)
RBC: 5.03 Mil/uL (ref 3.87–5.11)
RDW: 18.2 % — ABNORMAL HIGH (ref 11.5–15.5)
WBC: 8.1 10*3/uL (ref 4.0–10.5)

## 2022-08-11 LAB — IBC + FERRITIN
Ferritin: 6 ng/mL — ABNORMAL LOW (ref 10.0–291.0)
Iron: 34 ug/dL — ABNORMAL LOW (ref 42–145)
Saturation Ratios: 7 % — ABNORMAL LOW (ref 20.0–50.0)
TIBC: 484.4 ug/dL — ABNORMAL HIGH (ref 250.0–450.0)
Transferrin: 346 mg/dL (ref 212.0–360.0)

## 2022-08-11 MED ORDER — POLYETHYLENE GLYCOL 3350 17 G PO PACK: 17.0000 g | PACK | Freq: Every day | ORAL | 0 refills | Status: AC | PRN

## 2022-08-11 NOTE — Patient Instructions (Addendum)
If your blood pressure at your visit was 140/90 or greater, please contact your primary care physician to follow up on this. ______________________________________________________  If you are age 63 or older, your body mass index should be between 23-30. Your Body mass index is 29.95 kg/m. If this is out of the aforementioned range listed, please consider follow up with your Primary Care Provider.  If you are age 45 or younger, your body mass index should be between 19-25. Your Body mass index is 29.95 kg/m. If this is out of the aformentioned range listed, please consider follow up with your Primary Care Provider.  ________________________________________________________  The [facility] providers would like to encourage you to use [facility] to communicate with providers for non-urgent requests or questions.  Due to long hold times on the telephone, sending your provider a message by [facility] may be a faster and more efficient way to get a response.  Please allow 48 business hours for a response.  Please remember that this is for non-urgent requests.  _______________________________________________________  Due to recent changes in healthcare laws, you may see the results of your imaging and laboratory studies on MyChart before your provider has had a chance to review them.  We understand that in some cases there may be results that are confusing or concerning to you. Not all laboratory results come back in the same time frame and the provider may be waiting for multiple results in order to interpret others.  Please give [place] 48 hours in order for your provider to thoroughly review all the results before contacting the office for clarification of your results.   Please go to the lab in the basement of our building to have lab work done as you leave today. Hit "B" for basement when you get on the elevator.  When the doors open the lab is on your left.  We will call you with the results. Thank you.   You  have been scheduled for an endoscopy. Please follow written instructions given to you at your visit today. If you use inhalers (even only as needed), please bring them with you on the day of your procedure.   Please purchase the following medications over the counter and take as directed: Miralax use once daily as needed.  Continue oral iron   Thank you for entrusting me with your care and choosing [facility].

## 2022-08-11 NOTE — Progress Notes (Signed)
HPI :  62 year old female here for follow-up visit.  Recall she was admitted to the hospital March of this year with passing melena and iron deficiency anemia.  Stools were heme positive.  EGD was pursued at that time and showed multiple AVMs in her stomach and in her small intestine.  One of the AVMs in her stomach was actively bleeding, these were all cauterized with APC.  She did well at the time and was discharged in stable condition.  She was placed on oral iron supplementation.  She states this has made her stools been constipated but she is otherwise seems to be tolerating it okay.  She denies any NSAID use.  She has not had follow-up labs since March or so.  She had iron deficiency with hemoglobin in the eights.  Incidentally noted during her EGD she had a small duodenal polyp, potentially adenomatous.  This was not removed at the time given her actively bleeding AVM that was cauterized.  She otherwise has been feeling well since have last seen her.  She is not on any NSAIDs.  She does have a history of CVA, that was in 2010.  She is not on any anticoagulation currently.  She does have a history of COPD but is not on any supplemental oxygen.  She denies any cardiopulmonary symptoms.  She is otherwise feeling well without complaints.  She inquires when her last colonoscopy is due.  Recall she had a colonoscopy with Dr. Christella Hartigan in 2023, 2 small adenomas removed, her surveillance was not due until 2030.  Otherwise feels well today without complaints.  EGD 04/29/2022: - Esophagogastric landmarks identified. 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 - Normal esophagus otherwise. - A single non-bleeding angiodysplastic lesion in the cardia. Treated with argon plasma coagulation (APC). - Two non-bleeding angiodysplastic lesions in the fundus. Treated with argon plasma coagulation (APC). - A single bleeding angiodysplastic lesion in the gastric body. Treated with argon plasma coagulation (APC). This is  the likely culprit lesion for her bleeding. - A few angiodysplastic lesions in the duodenum. Treated with argon plasma coagulation (APC). - A single duodenal polyp not removed - Normal duodenum otherwise.   July 2023 screening colonoscopy  -Two sessile polyps were found in the transverse colon and ascending colon. The polyps were 3 to 4 mm in size. These polyps were removed with a cold snare. Resection and retrieval were complete. Findings: - Multiple small and large-mouthed diverticula were found in the entire colon. 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   Hyperlipidemia    Hypertension    Seasonal allergies    Sickle cell trait (HCC)    Stroke (HCC) 2013     Past Surgical History:  Procedure Laterality Date   ABDOMINAL HYSTERECTOMY     ESOPHAGOGASTRODUODENOSCOPY (EGD) WITH PROPOFOL N/A 04/29/2022   Procedure: ESOPHAGOGASTRODUODENOSCOPY (EGD) WITH PROPOFOL;  Surgeon: Benancio Deeds, MD;  Location: [facility];  Service: Gastroenterology;  Laterality: N/A;   HEMOSTASIS CLIP PLACEMENT  04/29/2022   Procedure: HEMOSTASIS CLIP PLACEMENT;  Surgeon: Benancio Deeds, MD;  Location: [facility];  Service: Gastroenterology;;   HOT HEMOSTASIS N/A 04/29/2022   Procedure: HOT HEMOSTASIS (ARGON PLASMA COAGULATION/BICAP);  Surgeon: Benancio Deeds, MD;  Location: [facility];  Service: Gastroenterology;  Laterality: N/A;   Family History  Problem Relation Age of Onset   Hyperlipidemia Mother    Hypertension Mother    Diabetes Mother    Renal Disease Mother    Congestive Heart Failure Mother    Hypertension Maternal Grandfather    Diabetes Maternal Grandfather    Heart disease Paternal Grandmother         died from mycardial infarcation   Colon polyps Daughter 3   Colon cancer Neg Hx    Esophageal cancer Neg Hx    Stomach cancer Neg Hx    Rectal cancer Neg Hx    Social History   Tobacco Use   Smoking status: Former    Packs/day: 0.50    Years: 41.00    Additional pack years: 0.00    Total pack years: 20.50    Types: Cigarettes    Start date: 02/25/1980   Smokeless tobacco: Never   Tobacco comments:    Quit in 2010 for 3 months - Quit 06/2022.  Vaping Use   Vaping Use: Never used  Substance Use Topics   Alcohol use: Not Currently    Comment: OCCASIONAL   Drug use: No   Current Outpatient Medications  Medication Sig Dispense Refill   Cyanocobalamin (VITAMIN B-12 PO) Take 1 tablet by mouth 3 (three) times daily.     ferrous sulfate 324 (65 Fe) MG TBEC Take 1 tablet (324 mg total) by mouth every other day. 30 tablet 3   Probiotic Product (PROBIOTIC PO) Take 1 capsule by mouth at bedtime.     Tiotropium Bromide Monohydrate (SPIRIVA RESPIMAT) 2.5 MCG/ACT AERS Inhale 2 puffs into the lungs daily. 1 each 6   No current facility-administered medications for this visit.   Allergies  Allergen Reactions   Asa [Aspirin] Nausea Only, Palpitations and Other (See Comments)    GI upset Tremors Reactions to 325mg  dose   Crestor [Rosuvastatin] Other (See Comments)    Myalgias    Flagyl [Metronidazole] Other (See Comments)    Yeast infections   Gluten Meal Nausea And Vomiting and Other (See Comments)    Bloating Flatulence   Milk-Related Compounds Nausea And Vomiting and Other (See Comments)    Bloating Gas   Chantix [Varenicline] Other (See Comments)    Dizziness   Egg-Derived Products Nausea Only   Fish Allergy Hives, Itching and Rash    Specifically tuna   Zocor [Simvastatin] Rash     Review of Systems: All systems reviewed and negative except where noted in HPI.   Lab Results  Component Value Date   WBC 7.8 05/06/2022   HGB 8.8 (L) 05/06/2022   HCT 27.5 (L)  05/06/2022   MCV 87 05/06/2022   PLT 354 05/06/2022    Lab Results  Component Value Date   IRON 18 (L) 05/06/2022   TIBC 374 05/06/2022   FERRITIN 15 05/06/2022     Physical Exam: BP (!) 142/70   Pulse 76   Ht 5\' 5"  (1.651 m)   Wt 180 lb (81.6 kg)   BMI 29.95 kg/m  Constitutional: Pleasant,well-developed, female in no acute distress. Neurological: Alert and oriented to person place and time. Psychiatric: Normal mood and affect. Behavior is normal.   ASSESSMENT: 62 y.o. female here for assessment of the following  1. Iron deficiency anemia, unspecified iron deficiency anemia type   2. AVM (arteriovenous malformation) of small bowel, acquired   3. Gastric AVM   4. Small bowel polyp  5. History of colon polyps    As above, history of melena/GI bleeding related to gastric AVMs.  Had multiple gastric/duodenal AVMs likely the cause of her IDA.  She has not had any further bleeding symptoms since her last endoscopy.  Has been on iron therapy but no follow-up blood work done since March.  I discussed what AVMs are with her, she understands and hopefully following endoscopic therapy this does not recur.  Over time we will determine if she can come off iron or not, possible she has additional small bowel AVMs that were not seen on this exam.  Incidentally was noted to have small bowel polyp that I recommend removing to ensure no adenomatous change, which is possible given endoscopic appearance.  I discussed EGD, risk benefits the exam and anesthesia and she is willing to proceed.  This will be scheduled.  Finally, while she does have IDA, her colonoscopy was within the last year and no high risk lesions.  I do not think she needs another colonoscopy at this time, as IDA is likely due to findings on her last EGD.  Will repeat CBC and iron studies today and iron to make sure this has resolved.  She will not likely need another colonoscopy until 2030 for surveillance of 2 small adenomas.   She agrees  PLAN: - lab today - CBC, TIBC / ferritin panel - continue oral iron - add Miralax daily PRN for constipation on iron  - EGD at the [facility] for removal of duodenal polyp - colonoscopy due in 2030  Harlin Rain, MD [facility]

## 2022-08-12 ENCOUNTER — Other Ambulatory Visit: Payer: Self-pay | Admitting: Pharmacy Technician

## 2022-08-12 ENCOUNTER — Telehealth: Payer: Self-pay

## 2022-08-12 ENCOUNTER — Encounter: Payer: Self-pay | Admitting: Gastroenterology

## 2022-08-12 DIAGNOSIS — D509 Iron deficiency anemia, unspecified: Secondary | ICD-10-CM | POA: Insufficient documentation

## 2022-08-12 NOTE — Telephone Encounter (Signed)
Auth Submission: NO AUTH NEEDED Site of care: Site of care: [facility] Payer: [employer]BS Medication & CPT/J Code(s) submitted: Venofer (Iron Sucrose) J1756 Route of submission (phone, fax, portal):  Phone # Fax # Auth type: Buy/Bill Units/visits requested: 5 Approval from: 08/12/2022 to 12/12/2022

## 2022-08-12 NOTE — Addendum Note (Signed)
Addended by: Missy Sabins on: 08/12/2022 09:11 AM   Modules accepted: Orders

## 2022-08-22 ENCOUNTER — Ambulatory Visit: Payer: Managed Care, Other (non HMO)

## 2022-08-22 VITALS — BP 151/75 | HR 72 | Temp 98.5°F | Resp 24 | Ht 65.0 in | Wt 179.8 lb

## 2022-08-22 DIAGNOSIS — D509 Iron deficiency anemia, unspecified: Secondary | ICD-10-CM

## 2022-08-22 MED ORDER — ACETAMINOPHEN 325 MG PO TABS
650.0000 mg | ORAL_TABLET | Freq: Once | ORAL | Status: AC
  Administered 2022-08-22: 650 mg via ORAL
  Filled 2022-08-22: qty 2

## 2022-08-22 MED ORDER — SODIUM CHLORIDE 0.9 % IV SOLN
200.0000 mg | Freq: Once | INTRAVENOUS | Status: AC
  Administered 2022-08-22: 200 mg via INTRAVENOUS
  Filled 2022-08-22: qty 10

## 2022-08-22 MED ORDER — DIPHENHYDRAMINE HCL 25 MG PO CAPS
25.0000 mg | ORAL_CAPSULE | Freq: Once | ORAL | Status: AC
  Administered 2022-08-22: 25 mg via ORAL
  Filled 2022-08-22: qty 1

## 2022-08-22 NOTE — Progress Notes (Signed)
Diagnosis: Iron Deficiency Anemia  Provider:  Chilton Greathouse MD  Procedure: IV Infusion  IV Type: Peripheral, IV Location: L Forearm  Venofer (Iron Sucrose), Dose: 200 mg  Infusion Start Time: 1437  Infusion Stop Time: 1501  Post Infusion IV Care: Observation period completed and Peripheral IV Discontinued  Discharge: Condition: Stable, Destination: Home . AVS Provided  Performed by:  Wyvonne Lenz, RN

## 2022-08-22 NOTE — Patient Instructions (Signed)

## 2022-08-29 ENCOUNTER — Ambulatory Visit: Payer: Managed Care, Other (non HMO) | Admitting: Gastroenterology

## 2022-08-29 ENCOUNTER — Encounter: Payer: Self-pay | Admitting: Gastroenterology

## 2022-08-29 VITALS — BP 152/67 | HR 68 | Temp 98.0°F | Resp 15 | Ht 65.0 in | Wt 180.0 lb

## 2022-08-29 DIAGNOSIS — D509 Iron deficiency anemia, unspecified: Secondary | ICD-10-CM

## 2022-08-29 DIAGNOSIS — K317 Polyp of stomach and duodenum: Secondary | ICD-10-CM | POA: Diagnosis not present

## 2022-08-29 MED ORDER — SODIUM CHLORIDE 0.9 % IV SOLN
500.00 mL | Freq: Once | INTRAVENOUS | Status: DC
Start: 2022-08-29 — End: 2022-08-29

## 2022-08-29 NOTE — Progress Notes (Signed)
A and O x3. Report to RN. Tolerated MAC anesthesia well.Teeth unchanged after procedure. 

## 2022-08-29 NOTE — Patient Instructions (Signed)
Resume previous diet Continue present medications Await pathology results Handouts/information given for esophageal web  YOU HAD AN ENDOSCOPIC PROCEDURE TODAY AT THE [facility]:   Refer to the procedure report that was given to you for any specific questions about what was found during the examination.  If the procedure report does not answer your questions, please call your gastroenterologist to clarify.  If you requested that your care partner not be given the details of your procedure findings, then the procedure report has been included in a sealed envelope for you to review at your convenience later.  YOU SHOULD EXPECT: Some feelings of bloating in the abdomen. Passage of more gas than usual.  Walking can help get rid of the air that was put into your GI tract during the procedure and reduce the bloating. If you had a lower endoscopy (such as a colonoscopy or flexible sigmoidoscopy) you may notice spotting of blood in your stool or on the toilet paper. If you underwent a bowel prep for your procedure, you may not have a normal bowel movement for a few days.  Please Note:  You might notice some irritation and congestion in your nose or some drainage.  This is from the oxygen used during your procedure.  There is no need for concern and it should clear up in a day or so.  SYMPTOMS TO REPORT IMMEDIATELY:  Following upper endoscopy (EGD)  Vomiting of blood or coffee ground material  New chest pain or pain under the shoulder blades  Painful or persistently difficult swallowing  New shortness of breath  Fever of 100F or higher  Black, tarry-looking stools  For urgent or emergent issues, a gastroenterologist can be reached at any hour by calling [phone]. Do not use MyChart messaging for urgent concerns.   DIET:  We do recommend a small meal at first, but then you may proceed to your regular diet.  Drink plenty of fluids but you should avoid alcoholic beverages for 24  hours.  ACTIVITY:  You should plan to take it easy for the rest of today and you should NOT DRIVE or use heavy machinery until tomorrow (because of the sedation medicines used during the test).    FOLLOW UP: Our staff will call the number listed on your records the next business day following your procedure.  We will call around 7:15- 8:00 am to check on you and address any questions or concerns that you may have regarding the information given to you following your procedure. If we do not reach you, we will leave a message.     If any biopsies were taken you will be contacted by phone or by letter within the next 1-3 weeks.  Please call us at [phone] if you have not heard about the biopsies in 3 weeks.    SIGNATURES/CONFIDENTIALITY: You and/or your care partner have signed paperwork which will be entered into your electronic medical record.  These signatures attest to the fact that that the information above on your After Visit Summary has been reviewed and is understood.  Full responsibility of the confidentiality of this discharge information lies with you and/or your care-partner.

## 2022-08-29 NOTE — Op Note (Signed)
[facility] Patient Name: [name] Procedure Date: 08/29/2022 1:49 PM MRN: [id number] Endoscopist: Viviann Spare P. Adela Lank , MD, [id number] Age: 62 Referring MD:  Date of Birth: 01-14-1961 Gender: Female Account #: [id number] Procedure:                Upper GI endoscopy Indications:              For therapy of polyps in the duodenum - noted                            incidentally when admitted for GI bleeding in                            March. EGD at the time showed AVMs in the stomach /                            small bowel that were ablated. Duodenal bulb polyp                            not removed at the time. Medicines:                Monitored Anesthesia Care Procedure:                Pre-Anesthesia Assessment:                           - Prior to the procedure, a History and Physical                            was performed, and patient medications and                            allergies were reviewed. The patient's tolerance of                            previous anesthesia was also reviewed. The risks                            and benefits of the procedure and the sedation                            options and risks were discussed with the patient.                            All questions were answered, and informed consent                            was obtained. Prior Anticoagulants: The patient has                            taken no anticoagulant or antiplatelet agents. ASA                            Grade Assessment: III - A patient with severe  systemic disease. After reviewing the risks and                            benefits, the patient was deemed in satisfactory                            condition to undergo the procedure.                           After obtaining informed consent, the endoscope was                            passed under direct vision. Throughout the                            procedure, the patient's  blood pressure, pulse, and                            oxygen saturations were monitored continuously. The                            GIF [id number] #[id number] was introduced through the                            mouth, and advanced to the second part of duodenum.                            The upper GI endoscopy was accomplished without                            difficulty. The patient tolerated the procedure                            well. Scope In: Scope Out: Findings:                 Esophagogastric landmarks were identified: the                            Z-line was found at 37 cm, the gastroesophageal                            junction was found at 37 cm and the upper extent of                            the gastric folds was found at 37 cm from the                            incisors.                           A web was found in the distal esophagus.                           The exam of the  esophagus was otherwise normal.                           The entire examined stomach was normal.                           A single 4 mm sessile polyp was found in the                            duodenal bulb. The polyp was removed with a cold                            snare. Resection and retrieval were complete.                           The exam of the duodenum was otherwise normal. Complications:            No immediate complications. Estimated blood loss:                            Minimal. Estimated Blood Loss:     Estimated blood loss was minimal. Impression:               - Esophagogastric landmarks identified.                           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                           - Normal esophagus otherwise.                           - Normal stomach.                           - A single duodenal polyp. Resected and retrieved.                           - Normal duodenum otherwise. Recommendation:           - Patient has a contact number available for                             emergencies. The signs and symptoms of potential                            delayed complications were discussed with the                            patient. Return to normal activities tomorrow.                            Written discharge instructions were provided to the                            patient.                           -  Resume previous diet.                           - Continue present medications.                           - Await pathology results with further                            recommendations. Viviann Spare P. Inge Waldroup, MD 08/29/2022 2:06:07 PM This report has been signed electronically.

## 2022-08-29 NOTE — Progress Notes (Signed)
History and Physical Interval Note: Here for EGD to remove duodenal polyp. See visit on 08/11/22 - no interval changes. Previously had EGD to evaluate IDA and multiple AVMs ablated. Now on iron, Hgb improving. Duodenal polyp was previously noted but not removed given anemia. Here for polypectomy today. Discussed risks / benefits she wishes to proceed.    08/29/2022 1:47 PM  [name]  has presented today for endoscopic procedure(s), with the diagnosis of  Encounter Diagnoses  Name Primary?   Polyp of duodenum Yes   Iron deficiency anemia, unspecified iron deficiency anemia type   .  The various methods of evaluation and treatment have been discussed with the patient and/or family. After consideration of risks, benefits and other options for treatment, the patient has consented to  the endoscopic procedure(s).   The patient's history has been reviewed, patient examined, no change in status, stable for surgery.  I have reviewed the patient's chart and labs.  Questions were answered to the patient's satisfaction.    Harlin Rain, MD [facility]

## 2022-08-29 NOTE — Progress Notes (Signed)
Called to room to assist during endoscopic procedure.  Patient ID and intended procedure confirmed with present staff. Received instructions for my participation in the procedure from the performing physician.  

## 2022-09-01 ENCOUNTER — Telehealth: Payer: Self-pay | Admitting: *Deleted

## 2022-09-01 NOTE — Telephone Encounter (Signed)
  Follow up Call-     08/29/2022    1:11 PM 09/20/2021    8:01 AM  Call back number  Post procedure Call Back phone  # [phone]  Permission to leave phone message Yes Yes     Patient questions:  Do you have a fever, pain , or abdominal swelling? No. Pain Score  0 *  Have you tolerated food without any problems? Yes.    Have you been able to return to your normal activities? Yes.    Do you have any questions about your discharge instructions: Diet   No. Medications  No. Follow up visit  No.  Do you have questions or concerns about your Care? No.  Actions: * If pain score is 4 or above: No action needed, pain <4.

## 2022-09-02 ENCOUNTER — Ambulatory Visit: Payer: Managed Care, Other (non HMO) | Admitting: Pharmacist

## 2022-09-05 ENCOUNTER — Ambulatory Visit: Payer: Managed Care, Other (non HMO)

## 2022-09-05 VITALS — BP 125/73 | HR 68 | Temp 97.8°F | Resp 20 | Ht 65.0 in | Wt 181.2 lb

## 2022-09-05 DIAGNOSIS — D509 Iron deficiency anemia, unspecified: Secondary | ICD-10-CM

## 2022-09-05 MED ORDER — DIPHENHYDRAMINE HCL 25 MG PO CAPS
25.0000 mg | ORAL_CAPSULE | Freq: Once | ORAL | Status: AC
  Administered 2022-09-05: 25 mg via ORAL
  Filled 2022-09-05: qty 1

## 2022-09-05 MED ORDER — ACETAMINOPHEN 325 MG PO TABS
650.0000 mg | ORAL_TABLET | Freq: Once | ORAL | Status: AC
  Administered 2022-09-05: 650 mg via ORAL
  Filled 2022-09-05: qty 2

## 2022-09-05 MED ORDER — SODIUM CHLORIDE 0.9 % IV SOLN
200.0000 mg | Freq: Once | INTRAVENOUS | Status: AC
  Administered 2022-09-05: 200 mg via INTRAVENOUS
  Filled 2022-09-05: qty 10

## 2022-09-05 NOTE — Progress Notes (Signed)
Diagnosis: Iron Deficiency Anemia  Provider:  Chilton Greathouse MD  Procedure: IV Infusion  IV Type: Peripheral, IV Location: L Antecubital  Venofer (Iron Sucrose), Dose: 200 mg  Infusion Start Time: 1506  Infusion Stop Time: 1521  Post Infusion IV Care: Patient declined observation and Peripheral IV Discontinued  Discharge: Condition: Stable, Destination: Home . AVS Declined  Performed by:  Wyvonne Lenz, RN

## 2022-09-12 ENCOUNTER — Ambulatory Visit (INDEPENDENT_AMBULATORY_CARE_PROVIDER_SITE_OTHER): Payer: Managed Care, Other (non HMO)

## 2022-09-12 ENCOUNTER — Encounter: Payer: Self-pay | Admitting: Gastroenterology

## 2022-09-12 VITALS — BP 141/70 | HR 74 | Temp 98.3°F | Resp 18 | Ht 65.0 in | Wt 180.6 lb

## 2022-09-12 DIAGNOSIS — D509 Iron deficiency anemia, unspecified: Secondary | ICD-10-CM | POA: Diagnosis not present

## 2022-09-12 MED ORDER — SODIUM CHLORIDE 0.9 % IV SOLN
200.0000 mg | Freq: Once | INTRAVENOUS | Status: AC
  Administered 2022-09-12: 200 mg via INTRAVENOUS
  Filled 2022-09-12: qty 10

## 2022-09-12 MED ORDER — DIPHENHYDRAMINE HCL 25 MG PO CAPS
25.0000 mg | ORAL_CAPSULE | Freq: Once | ORAL | Status: AC
  Administered 2022-09-12: 25 mg via ORAL

## 2022-09-12 MED ORDER — ACETAMINOPHEN 325 MG PO TABS
650.0000 mg | ORAL_TABLET | Freq: Once | ORAL | Status: AC
  Administered 2022-09-12: 650 mg via ORAL

## 2022-09-12 NOTE — Progress Notes (Signed)
Diagnosis: Iron Deficiency Anemia  Provider:  Chilton Greathouse MD  Procedure: IV Infusion  IV Type: Peripheral, IV Location: R Forearm  Venofer (Iron Sucrose), Dose: 200 mg  Infusion Start Time: 1456  Infusion Stop Time: 1514  Post Infusion IV Care: Patient declined observation and Peripheral IV Discontinued  Discharge: Condition: Good, Destination: Home . AVS Declined  Performed by:  Wyvonne Lenz, RN

## 2022-09-19 ENCOUNTER — Encounter: Payer: Self-pay | Admitting: Pharmacist

## 2022-09-19 ENCOUNTER — Ambulatory Visit (INDEPENDENT_AMBULATORY_CARE_PROVIDER_SITE_OTHER): Payer: Managed Care, Other (non HMO) | Admitting: *Deleted

## 2022-09-19 ENCOUNTER — Ambulatory Visit (INDEPENDENT_AMBULATORY_CARE_PROVIDER_SITE_OTHER): Payer: Managed Care, Other (non HMO) | Admitting: Pharmacist

## 2022-09-19 VITALS — BP 120/60 | HR 85 | Ht 65.0 in | Wt 178.5 lb

## 2022-09-19 VITALS — BP 149/79 | HR 83 | Temp 98.4°F | Resp 16 | Ht 65.0 in | Wt 179.0 lb

## 2022-09-19 DIAGNOSIS — Z87891 Personal history of nicotine dependence: Secondary | ICD-10-CM

## 2022-09-19 DIAGNOSIS — D509 Iron deficiency anemia, unspecified: Secondary | ICD-10-CM

## 2022-09-19 MED ORDER — SODIUM CHLORIDE 0.9 % IV SOLN
200.0000 mg | Freq: Once | INTRAVENOUS | Status: AC
  Administered 2022-09-19: 200 mg via INTRAVENOUS
  Filled 2022-09-19: qty 10

## 2022-09-19 MED ORDER — ACETAMINOPHEN 325 MG PO TABS
650.0000 mg | ORAL_TABLET | Freq: Once | ORAL | Status: AC
  Administered 2022-09-19: 650 mg via ORAL
  Filled 2022-09-19: qty 2

## 2022-09-19 MED ORDER — DIPHENHYDRAMINE HCL 25 MG PO CAPS
25.0000 mg | ORAL_CAPSULE | Freq: Once | ORAL | Status: AC
  Administered 2022-09-19: 25 mg via ORAL
  Filled 2022-09-19: qty 1

## 2022-09-19 NOTE — Assessment & Plan Note (Signed)
Tobacco use disorder history in patient who has quit for 2.5 months.  Excellent candidate for long-term success because of current level of motivation.    - Congratulated on success quit.  - We discussed her progress and success over the last 12 months - We reviewed high level stressor management / sources of relapse into the future.  - I will plan to follow-up for long-term support by phone.  6 month quit call planned for early November.

## 2022-09-19 NOTE — Progress Notes (Signed)
   S:   Chief Complaint  Patient presents with   Medication Management    Tobacco Cessation - QUIT!   [name] is a 62 y.o. female who presents for evaluation/assistance with tobacco dependence.  She reports being quit for almost 3 months.  She has had only 3 slips in the preceding few weeks.  PMH is significant for COPD with Asthma.  Patient was referred and last seen by Primary Care Provider, Dr. Idalia Needle.    At last visit, was nearly complete abstinence and was committed to long-term quit status.   Age when started using tobacco on a daily basis 20.  Non longer using any medications for support of nicotine/tobacco cessation.   Rates IMPORTANCE of quitting tobacco on 1-10 scale of 10. Rates CONFIDENCE of remaining quit from tobacco long-term on 1-10 scale of 10.  Most common triggers to use tobacco include; Stress - reviewed handling the big stressors in life into the future.    Motivation to quit: Breathing.   O:  Review of Systems  Respiratory:  Negative for shortness of breath.     Physical Exam Pulmonary:     Effort: Pulmonary effort is normal.  Neurological:     Mental Status: She is alert.  Psychiatric:        Mood and Affect: Mood normal.        Behavior: Behavior normal.        Thought Content: Thought content normal.        Judgment: Judgment normal.    A/P: Tobacco use disorder history in patient who has quit for 2.5 months.  Excellent candidate for long-term success because of current level of motivation.    - Congratulated on success quit.  - We discussed her progress and success over the last 12 months - We reviewed high level stressor management / sources of relapse into the future.  - I will plan to follow-up for long-term support by phone.  6 month quit call planned for early November.  Written patient instructions provided. Patient verbalized understanding of treatment plan.  Total time in face to face counseling 17 minutes.    Follow-up:   Pharmacist PRN. PCP clinic visit Dr. Marisue Humble in early September.

## 2022-09-19 NOTE — Patient Instructions (Signed)
It was nice to see you today!  Medication Changes: Continue all medications the same.  Come back any time.   Keep up the good work with diet and exercise. Aim for a diet full of vegetables, fruit and lean meats (chicken, [place], fish). Try to limit salt intake by eating fresh or frozen vegetables (instead of canned), rinse canned vegetables prior to cooking and do not add any additional salt to meals.

## 2022-09-19 NOTE — Progress Notes (Signed)
Reviewed and agree with Dr Koval's plan.   

## 2022-09-19 NOTE — Progress Notes (Signed)
Diagnosis: Iron Deficiency Anemia  Provider:  Chilton Greathouse MD  Procedure: IV Infusion  IV Type: Peripheral, IV Location: R Hand  Venofer (Iron Sucrose), Dose: 200 mg  Infusion Start Time: 1501  Infusion Stop Time: 1523 pm  Post Infusion IV Care: Observation period completed and Peripheral IV Discontinued  Discharge: Condition: Good, Destination: Home . AVS Declined  Performed by:  Adriana Mccallum, RN

## 2022-09-26 ENCOUNTER — Ambulatory Visit (INDEPENDENT_AMBULATORY_CARE_PROVIDER_SITE_OTHER): Payer: Managed Care, Other (non HMO)

## 2022-09-26 VITALS — BP 144/65 | HR 77 | Temp 97.6°F | Resp 18 | Wt 180.0 lb

## 2022-09-26 DIAGNOSIS — D509 Iron deficiency anemia, unspecified: Secondary | ICD-10-CM

## 2022-09-26 MED ORDER — DIPHENHYDRAMINE HCL 25 MG PO CAPS
25.0000 mg | ORAL_CAPSULE | Freq: Once | ORAL | Status: AC
  Administered 2022-09-26: 25 mg via ORAL
  Filled 2022-09-26: qty 1

## 2022-09-26 MED ORDER — SODIUM CHLORIDE 0.9 % IV SOLN
200.0000 mg | Freq: Once | INTRAVENOUS | Status: AC
  Administered 2022-09-26: 200 mg via INTRAVENOUS
  Filled 2022-09-26: qty 10

## 2022-09-26 MED ORDER — ACETAMINOPHEN 325 MG PO TABS
650.0000 mg | ORAL_TABLET | Freq: Once | ORAL | Status: AC
  Administered 2022-09-26: 650 mg via ORAL
  Filled 2022-09-26: qty 2

## 2022-09-26 NOTE — Progress Notes (Signed)
Diagnosis: Iron Deficiency Anemia  Provider:  Chilton Greathouse MD  Procedure: IV Infusion  IV Type: Peripheral, IV Location: R Forearm  Venofer (Iron Sucrose), Dose: 200 mg  Infusion Start Time: 1500  Infusion Stop Time: 1518  Post Infusion IV Care: Patient declined observation and Peripheral IV Discontinued  Discharge: Condition: Good, Destination: Home . AVS Declined  Performed by:  Garnette Czech, RN

## 2022-10-08 NOTE — Telephone Encounter (Signed)
done

## 2022-10-28 ENCOUNTER — Encounter: Payer: Self-pay | Admitting: Pharmacist

## 2022-10-30 ENCOUNTER — Ambulatory Visit (INDEPENDENT_AMBULATORY_CARE_PROVIDER_SITE_OTHER): Payer: Managed Care, Other (non HMO) | Admitting: Student

## 2022-10-30 ENCOUNTER — Other Ambulatory Visit: Payer: Self-pay

## 2022-10-30 ENCOUNTER — Encounter: Payer: Self-pay | Admitting: Student

## 2022-10-30 VITALS — BP 124/86 | HR 65 | Ht 65.0 in | Wt 179.2 lb

## 2022-10-30 DIAGNOSIS — Z1231 Encounter for screening mammogram for malignant neoplasm of breast: Secondary | ICD-10-CM | POA: Diagnosis not present

## 2022-10-30 DIAGNOSIS — Z122 Encounter for screening for malignant neoplasm of respiratory organs: Secondary | ICD-10-CM | POA: Diagnosis not present

## 2022-10-30 DIAGNOSIS — R011 Cardiac murmur, unspecified: Secondary | ICD-10-CM

## 2022-10-30 DIAGNOSIS — I3139 Other pericardial effusion (noninflammatory): Secondary | ICD-10-CM | POA: Diagnosis not present

## 2022-10-30 DIAGNOSIS — L918 Other hypertrophic disorders of the skin: Secondary | ICD-10-CM

## 2022-10-30 DIAGNOSIS — D508 Other iron deficiency anemias: Secondary | ICD-10-CM

## 2022-10-30 NOTE — Patient Instructions (Addendum)
Ms. Boyce, [place] to meet you!  A few things for [place] to follow up on: I have ordered your mammogram. This will be done at the [facility], right across the street from our clinic. You will need to call to make this appointment. Their number is [phone].  The address is [address], [place], [place] [address]  I have also ordered you a CT of the chest due to your smoking history, [facility] will call you to schedule this.  I'd also like to repeat echocardiogram given the fluid that was around your heart in 2022. They will call you to schedule this as well.  Eliezer Mccoy, MD

## 2022-10-30 NOTE — Progress Notes (Signed)
    SUBJECTIVE:   CHIEF COMPLAINT / HPI:   New Patient to Me Comes to me from Dr. Idalia Needle. 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 Follows with Dr. Adela Lank outpatient. Also sees Dr. Raymondo Band regularly for smoking cessation efforts.   Skin Tags Has skin tags of the bilateral axillae that she would like removed as they catch on clothing and are bothersome to her.   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 Followed by GI, got several venofer infusions per GI. Last Hgb was 11.6 in June and has had two more venofer infusions since that time. Feels well. No recent melena/hematochezia.   Health Maintenance S/p radical hysterectomy: no cervix. Colonoscopy in 07/23 with tubular adenomas x2 that were removed.  Mammogram in 07/2021 CT Chest in 03/22 with recommendation for 12 mo follow-up.   PERTINENT  PMH / PSH: Subarachnoid hemorrhage in 2012. COPD.   OBJECTIVE:   BP 124/86   Pulse 65   Ht 5\' 5"  (1.651 m)   Wt 179 lb 3.2 oz (81.3 kg)   SpO2 99%   BMI 29.82 kg/m   Gen: In good spirits, vibrant and NAD Cardio: RRR, 1-2/6 systolic murmur heard over RUSB/LUSB, no LE Edema Pulm: Normal WOB on RA, lungs clear throughout Skin: ~74mm skin tags, one to each axilla. No surrounding inflammation.   ASSESSMENT/PLAN:   Acrochordon Procedure: Skin tag removal Informed consent:  Discussed risks (permanent scarring, infection, pain, bleeding, bruising, redness, and recurrence of the lesion) and benefits of the procedure, as well as the alternatives.  She is aware that skin tags are benign lesions, and their removal is often not considered medically necessary.  Informed consent was obtained. Anesthesia: Gebauers Spray  The area was prepared in a standard fashion with alcohol wipes. Snip removal was performed.   The patient tolerated procedure well. The patient was instructed on post-op care.   Number of lesions removed:  2   Iron deficiency anemia Being managed by GI. Getting regular venofer infusions. Last Hgb was quite  appropriate. Defer further management to her GI specialists.   Systolic murmur Murmur is rather soft, I can only really hear it with an amplified stethoscope. Echo in 2022 with calcification of aortic valve without aortic stenosis. There was, however, a moderate pericardial effusion. She had a recent viral illness at that time, but wouldn't expect such a large effusion simply from viral infection.  - Repeat echo    Healthcare Maintenance Due for Mammogram- ordered CT Chest due to smoking history- due for repeat, ordered. Has cut back significantly on cigarettes, now just a handful each week.   Eliezer Mccoy, MD [facility]

## 2022-10-31 DIAGNOSIS — L918 Other hypertrophic disorders of the skin: Secondary | ICD-10-CM | POA: Insufficient documentation

## 2022-10-31 NOTE — Assessment & Plan Note (Signed)
Procedure: Skin tag removal Informed consent:  Discussed risks (permanent scarring, infection, pain, bleeding, bruising, redness, and recurrence of the lesion) and benefits of the procedure, as well as the alternatives.  She is aware that skin tags are benign lesions, and their removal is often not considered medically necessary.  Informed consent was obtained. Anesthesia: Gebauers Spray  The area was prepared in a standard fashion with alcohol wipes. Snip removal was performed.   The patient tolerated procedure well. The patient was instructed on post-op care.   Number of lesions removed:  2

## 2022-10-31 NOTE — Assessment & Plan Note (Signed)
Being managed by GI. Getting regular venofer infusions. Last Hgb was quite appropriate. Defer further management to her GI specialists.

## 2022-10-31 NOTE — Assessment & Plan Note (Signed)
Murmur is rather soft, I can only really hear it with an amplified stethoscope. Echo in 2022 with calcification of aortic valve without aortic stenosis. There was, however, a moderate pericardial effusion. She had a recent viral illness at that time, but wouldn't expect such a large effusion simply from viral infection.  - Repeat echo

## 2022-11-04 ENCOUNTER — Other Ambulatory Visit: Payer: Self-pay

## 2022-11-18 ENCOUNTER — Ambulatory Visit: Payer: Managed Care, Other (non HMO)

## 2022-11-18 ENCOUNTER — Other Ambulatory Visit: Payer: Managed Care, Other (non HMO)

## 2022-12-11 ENCOUNTER — Ambulatory Visit
Admission: RE | Admit: 2022-12-11 | Discharge: 2022-12-11 | Disposition: A | Discharge status: Home / Self Care | Payer: Managed Care, Other (non HMO) | Source: Ambulatory Visit | Attending: Family Medicine | Admitting: Family Medicine

## 2022-12-11 ENCOUNTER — Ambulatory Visit (HOSPITAL_COMMUNITY)
Admission: RE | Admit: 2022-12-11 | Discharge: 2022-12-11 | Disposition: A | Discharge status: Home / Self Care | Payer: Managed Care, Other (non HMO) | Source: Ambulatory Visit | Attending: Family Medicine | Admitting: Family Medicine

## 2022-12-11 DIAGNOSIS — J449 Chronic obstructive pulmonary disease, unspecified: Secondary | ICD-10-CM | POA: Diagnosis not present

## 2022-12-11 DIAGNOSIS — Z87891 Personal history of nicotine dependence: Secondary | ICD-10-CM | POA: Insufficient documentation

## 2022-12-11 DIAGNOSIS — Z122 Encounter for screening for malignant neoplasm of respiratory organs: Secondary | ICD-10-CM

## 2022-12-11 DIAGNOSIS — I779 Disorder of arteries and arterioles, unspecified: Secondary | ICD-10-CM | POA: Insufficient documentation

## 2022-12-11 DIAGNOSIS — Z1231 Encounter for screening mammogram for malignant neoplasm of breast: Secondary | ICD-10-CM

## 2022-12-11 DIAGNOSIS — E785 Hyperlipidemia, unspecified: Secondary | ICD-10-CM | POA: Insufficient documentation

## 2022-12-11 DIAGNOSIS — I3139 Other pericardial effusion (noninflammatory): Secondary | ICD-10-CM | POA: Insufficient documentation

## 2022-12-11 DIAGNOSIS — I119 Hypertensive heart disease without heart failure: Secondary | ICD-10-CM | POA: Insufficient documentation

## 2022-12-11 LAB — ECHOCARDIOGRAM COMPLETE
AR max vel: 3.06 cm2
AV Area VTI: 3.25 cm2
AV Area mean vel: 3.09 cm2
AV Mean grad: 6.3 mm[Hg]
AV Peak grad: 11.7 mm[Hg]
Ao pk vel: 1.71 m/s
Area-P 1/2: 3.21 cm2
S' Lateral: 2.7 cm

## 2022-12-11 NOTE — Progress Notes (Signed)
*  PRELIMINARY RESULTS* Echocardiogram 2D Echocardiogram has been performed.  [name] 12/11/2022, 11:57 AM

## 2022-12-11 NOTE — Progress Notes (Signed)
*  PRELIMINARY RESULTS* Echocardiogram 2D Echocardiogram has been performed.  [name] 12/11/2022, 11:56 AM

## 2022-12-12 ENCOUNTER — Telehealth: Payer: Self-pay | Admitting: Student

## 2022-12-12 DIAGNOSIS — I3139 Other pericardial effusion (noninflammatory): Secondary | ICD-10-CM

## 2022-12-12 NOTE — Telephone Encounter (Signed)
Called patient to discuss recent echocardiogram results showing stable moderately sized pericardial effusion, also present on last echo in 2022.  Reassuringly, she is asymptomatic.  Discussed recommendation for cardiology referral for further evaluation as this is abnormal.  Patient agreed.  Referral placed.

## 2023-01-03 NOTE — Progress Notes (Unsigned)
  Cardiology Office Note:  .   Date:  01/03/2023  ID:  [name], DOB Dec 28, 1960, MRN [id number] PCP: Brittany Amel, MD  [facility] Providers Cardiologist:  Brittany Lerner, DO  History of Present Illness: .   [name] is a 62 y.o. female with a past medical history of HTN, COPD, history of subarachnoid hemorrhage, tobacco use, HLD. Patient is followed by Dr. Odis Hurley and presents today for evaluation of pericardial effusion on recent echocardiogram  Per chart review patient was first seen by Dr [name] in 05/2020 for evaluation of abnormal echocardiogram. Echocardiogram on 05/04/20 showed EF 60-65%, no wall motion abnormalities, severe LVH, normal RV function. There was a moderate pericardial effusion without evidence of cardiac tamponade, mild-moderate MR. Dr. Odis Hurley recommended cardiac MRI, but study was never completed. ANA was negative, ESR and CRP normal. Cause of pericardial effusion was unknown. Patient has not been seen by cardiology since that time   Patient recently underwent echocardiogram on 12/11/22 that showed EF 60-65%, no regional wall motion abnormalities, grade I DD, normal RV function. Moderate pericardial effusion, unchanged in size compared with 04/2020 echo. No evidence of cardiac tamponade. Patient was referred to cardiology for further evaluation    Severe LVH  - Patient was first seen by cardiology in 05/2020 for evaluation of LVH. Recommended cardiac MRI that was never completed - Most recent echocardiogram from 12/11/22 showed EF 60-65%, no regional wall motion abnormalities, grade I DD, severe asymmetric LVH of the septal segment with apical gradient- peak velocity 1.85 m/s, peak gradient 13.6 mmHg  - Ordered cMRI to evaluate for hypertrophic cardiomyopathy  - *** Start metoprolol succinate  - Discussed importance of adequate hydration.   Pericardial Effusion  - Chronic- echocardiogram in 04/2020 showed moderate pericardial effusion. 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 She denies shortness of breath, low BP,   HTN   Tobacco use   HLD  - Lipid panel from 02/2021 showed LDL 205, HDL 38, triglycerides 220, total cholesterol 286  -   ROS: ***  Studies Reviewed: .        *** Risk Assessment/Calculations:   {Does this patient have ATRIAL FIBRILLATION?:[id number]} No BP recorded.  {Refresh Note OR Click here to enter BP  :1}***       Physical Exam:   VS:  There were no vitals taken for this visit.   Wt Readings from Last 3 Encounters:  10/30/22 179 lb 3.2 oz (81.3 kg)  09/26/22 180 lb (81.6 kg)  09/19/22 179 lb (81.2 kg)    GEN: Well nourished, well developed in no acute distress NECK: No JVD; No carotid bruits CARDIAC: ***RRR, no murmurs, rubs, gallops RESPIRATORY:  Clear to auscultation without rales, wheezing or rhonchi  ABDOMEN: Soft, non-tender, non-distended EXTREMITIES:  No edema; No deformity   ASSESSMENT AND PLAN: .   ***    {Are you ordering a CV Procedure (e.g. stress test, cath, DCCV, TEE, etc)?   Press F2        :[id number]}  Dispo: ***  Signed, Jonita Albee, PA-C

## 2023-01-05 ENCOUNTER — Ambulatory Visit: Payer: Managed Care, Other (non HMO) | Attending: Cardiology | Admitting: Cardiology

## 2023-01-05 ENCOUNTER — Encounter: Payer: Self-pay | Admitting: Cardiology

## 2023-01-05 VITALS — BP 132/86 | HR 68 | Ht 65.0 in | Wt 179.0 lb

## 2023-01-05 DIAGNOSIS — E782 Mixed hyperlipidemia: Secondary | ICD-10-CM | POA: Diagnosis not present

## 2023-01-05 DIAGNOSIS — Z789 Other specified health status: Secondary | ICD-10-CM | POA: Diagnosis not present

## 2023-01-05 DIAGNOSIS — I517 Cardiomegaly: Secondary | ICD-10-CM | POA: Diagnosis not present

## 2023-01-05 DIAGNOSIS — I3139 Other pericardial effusion (noninflammatory): Secondary | ICD-10-CM

## 2023-01-05 DIAGNOSIS — I10 Essential (primary) hypertension: Secondary | ICD-10-CM | POA: Diagnosis not present

## 2023-01-05 MED ORDER — CARVEDILOL 3.125 MG PO TABS: 3.1250 mg | ORAL_TABLET | Freq: Two times a day (BID) | ORAL | 3 refills | Status: DC

## 2023-01-05 NOTE — Patient Instructions (Addendum)
Medication Instructions:  Start Carvedilol 3.125 mg twice a day *If you need a refill on your cardiac medications before your next appointment, please call your pharmacy*  Lab Work: Today CBC If you have labs (blood work) drawn today and your tests are completely normal, you will receive your results only by: MyChart Message (if you have MyChart) OR A paper copy in the mail If you have any lab test that is abnormal or we need to change your treatment, we will call you to review the results.  Testing/Procedures:   Cardiac MRI ?  [facility] [address] [place], [place] [address] [phone] Please take advantage of the free valet parking available at the [facility] and [employer] (Entrance C).  Proceed to the [facility] Radiology Department (First Floor) for check-in.   OR   [facility] [address] [place], [place] [address] [phone] Please go to the [facility] and check-in with the desk attendant.   Magnetic resonance imaging (MRI) is a painless test that produces images of the inside of the body without using Xrays.  During an MRI, strong magnets and radio waves work together in a Data processing manager to form detailed images.   MRI images may provide more details about a medical condition than X-rays, CT scans, and ultrasounds can provide.  You may be given earphones to listen for instructions.  You may eat a light breakfast and take medications as ordered with the exception of furosemide, hydrochlorothiazide, or spironolactone(fluid pill, other). If you are undergoing a stress MRI, please avoid stimulants for 12 hr prior to test. (Ie. Caffeine, nicotine, chocolate, or antihistamine medications)  An IV will be inserted into one of your veins. Contrast material will be injected into your IV. It will leave your body through your urine within a day. You may be told to drink plenty of fluids to help flush the contrast  material out of your system.  You will be asked to remove all metal, including: Watch, jewelry, and other metal objects including hearing aids, hair pieces and dentures. Also wearable glucose monitoring systems (ie. Freestyle Libre and Omnipods) (Braces and fillings normally are not a problem.)   TEST WILL TAKE APPROXIMATELY 1 HOUR  PLEASE NOTIFY SCHEDULING AT LEAST 24 HOURS IN ADVANCE IF YOU ARE UNABLE TO KEEP YOUR APPOINTMENT. [phone]  For more information and frequently asked questions, please visit our website : [email or web address]  Please call the Cardiac Imaging Nurse Navigators with any questions/concerns. [phone] Office   Follow-Up: At [facility], you and your health needs are our priority.  As part of our continuing mission to provide you with exceptional heart care, we have created designated Provider Care Teams.  These Care Teams include your primary Cardiologist (physician) and Advanced Practice Providers (APPs -  Physician Assistants and Nurse Practitioners) who all work together to provide you with the care you need, when you need it.  We recommend signing up for the patient portal called "MyChart".  Sign up information is provided on this After Visit Summary.  MyChart is used to connect with patients for Virtual Visits (Telemedicine).  Patients are able to view lab/test results, encounter notes, upcoming appointments, etc.  Non-urgent messages can be sent to your provider as well.   To learn more about what you can do with MyChart, go to [email or web address].    Your next appointment:   2 Months  Provider:   Tessa Lerner, DO

## 2023-01-06 LAB — CBC
Hematocrit: 43.7 % (ref 34.0–46.6)
Hemoglobin: 13.9 g/dL (ref 11.1–15.9)
MCH: 28.5 pg (ref 26.6–33.0)
MCHC: 31.8 g/dL (ref 31.5–35.7)
MCV: 90 fL (ref 79–97)
Platelets: 295 10*3/uL (ref 150–450)
RBC: 4.88 x10E6/uL (ref 3.77–5.28)
RDW: 14.2 % (ref 11.7–15.4)
WBC: 7.2 10*3/uL (ref 3.4–10.8)

## 2023-01-12 ENCOUNTER — Telehealth: Payer: Self-pay | Admitting: Pharmacist

## 2023-01-12 NOTE — Telephone Encounter (Signed)
Attempted to contact patient for follow-up of tobacco intake reduction / cessation.  I believe this may be 6 month quit follow-up call.   Left HIPAA compliant voice mail requesting call back to direct phone: [phone]  Total time with patient call and documentation of interaction: 7 minutes.  Additional follow-up phone call planned: 4 weeks

## 2023-02-02 ENCOUNTER — Encounter: Payer: Self-pay | Admitting: Student

## 2023-02-02 ENCOUNTER — Ambulatory Visit (INDEPENDENT_AMBULATORY_CARE_PROVIDER_SITE_OTHER): Payer: Managed Care, Other (non HMO) | Admitting: Student

## 2023-02-02 VITALS — BP 141/80 | HR 80 | Temp 98.0°F | Wt 180.6 lb

## 2023-02-02 DIAGNOSIS — R300 Dysuria: Secondary | ICD-10-CM | POA: Diagnosis not present

## 2023-02-02 LAB — POCT URINALYSIS DIP (MANUAL ENTRY)
Bilirubin, UA: NEGATIVE
Glucose, UA: NEGATIVE mg/dL
Ketones, POC UA: NEGATIVE mg/dL
Nitrite, UA: NEGATIVE
Protein Ur, POC: 30 mg/dL — AB
Spec Grav, UA: 1.015 (ref 1.010–1.025)
Urobilinogen, UA: 2 U/dL — AB
pH, UA: 7 (ref 5.0–8.0)

## 2023-02-02 LAB — POCT UA - MICROSCOPIC ONLY
RBC, Urine, Miroscopic: >20 (ref 0–2)
WBC, Ur, HPF, POC: >20 (ref 0–5)

## 2023-02-02 MED ORDER — CIPROFLOXACIN HCL 500 MG PO TABS: 500.0000 mg | ORAL_TABLET | Freq: Two times a day (BID) | ORAL | 0 refills | Status: AC

## 2023-02-02 MED ORDER — CEFTRIAXONE SODIUM 1 G IJ SOLR
1.0000 g | Freq: Once | INTRAMUSCULAR | Status: AC
  Administered 2023-02-02: 1 g via INTRAMUSCULAR

## 2023-02-02 NOTE — Progress Notes (Signed)
    SUBJECTIVE:   CHIEF COMPLAINT / HPI:   62 y.o.  year old female  presents with complaint 6 days of dysuria  She also endorses increased frequency and urgency.  Denies vaginal discharge, itchiness or odor. No fever but endorses chills, suprapubic and lower back pain No hematuria or recent change in medication.  PERTINENT  PMH / PSH: Reviewed   OBJECTIVE:   BP (!) 141/80   Pulse 80   Temp 98 F (36.7 C)   Wt 180 lb 9.6 oz (81.9 kg)   SpO2 96%   BMI 30.05 kg/m    Physical Exam General: Generally fatigue, non toxic appearing, NAD Cardiovascular: RRR, No Murmurs, Normal S2/S2 Respiratory: CTAB, No wheezing or Rales Abdomen: No distension or tenderness Musculoskeletal: Positive suprapubic tenderness, bilateral CVA tenderness Extremities: No edema on extremities   Skin: Warm and dry  ASSESSMENT/PLAN:   Dysuria Patient complaining of dysuria and increased urinary frequency in the last 3 days with suprapubic pain positive CVA tenderness on exam.  POCT dipstick was consistent with UTI.  Overall symptoms and presentation more consistent with pyelonephritis. -Administered 1 g Rocephin in the clinic today -Rx ciprofloxacin 500 mg twice daily for 7 days -ED precautions discussed with patient -PRN Tylenol for pain     Jerre Simon, MD [facility]

## 2023-02-02 NOTE — Patient Instructions (Signed)
It was nice to meet you.  Your symptoms and results of your urine analysis today was positive for UTI.  Today we gave you 1 g injection of Rocephin and also I have sent in medication for oral ciprofloxacin which you will take 2 times daily for 7 days.  You should start taking your ciprofloxacin tomorrow.  Urine culture has been sent out

## 2023-02-03 ENCOUNTER — Ambulatory Visit: Payer: Managed Care, Other (non HMO)

## 2023-02-03 NOTE — Progress Notes (Unsigned)
Office Visit    Patient Name: [name] Date of Encounter: 02/03/2023  Primary Care Provider:  Alicia Amel, MD Primary Cardiologist:  Tessa Lerner, DO  Chief Complaint    Hyperlipidemia - familial  Significant Past Medical History   HTN Elevated at last visit, on carvedilol 3.125    Allergies  Allergen Reactions   Asa [Aspirin] Nausea Only, Palpitations and Other (See Comments)    GI upset Tremors Reactions to 325mg  dose   Crestor [Rosuvastatin] Other (See Comments)    Myalgias    Flagyl [Metronidazole] Other (See Comments)    Yeast infections   Gluten Meal Nausea And Vomiting and Other (See Comments)    Bloating Flatulence   Milk-Related Compounds Nausea And Vomiting and Other (See Comments)    Bloating Gas   Chantix [Varenicline] Other (See Comments)    Dizziness   Egg-Derived Products Nausea Only   Fish Allergy Hives, Itching and Rash    Specifically tuna   Zocor [Simvastatin] Rash    History of Present Illness    [name] is a 62 y.o. female patient of Dr Odis Hollingshead, in the office today to discuss options for cholesterol management.  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 [] Yes [] No  Do you ever skip doses due to side effects? [] Yes [] No  Do you have trouble affording your medicines? [] Yes [] No  Are you ever unable to pick up your medication due to transportation difficulties? [] Yes [] No  Do you ever stop taking your medications because you don't believe they are helping? [] Yes [] No  Do you check your weight daily? [] Yes [] No   Adherence strategy: ***  Barriers to obtaining medications: ***     Accessory Clinical Findings   Lab Results  Component Value Date    CHOL 286 (H) 03/25/2021   HDL 38 (L) 03/25/2021   LDLCALC 205 (H) 03/25/2021   TRIG 220 (H) 03/25/2021   CHOLHDL 7.5 (H) 03/25/2021    No results found for: "LIPOA"  Lab Results  Component Value Date   ALT 13 04/29/2022   AST 19 04/29/2022   ALKPHOS 65 04/29/2022   BILITOT 0.6 04/29/2022   Lab Results  Component Value Date   CREATININE 0.89 05/01/2022   BUN 9 05/01/2022   NA 143 05/01/2022   K 4.1 05/01/2022   CL 105 05/01/2022   CO2 23 05/01/2022   Lab Results  Component Value Date   HGBA1C 5.6 03/11/2022    Home Medications    Current Outpatient Medications  Medication Sig Dispense Refill   carvedilol (COREG) 3.125 MG tablet Take 1 tablet (3.125 mg total) by mouth 2 (two) times daily. 180 tablet 3   ciprofloxacin (CIPRO) 500 MG tablet Take 1 tablet (500 mg total) by mouth 2 (two) times daily for 7 days. 14 tablet 0   Cyanocobalamin (VITAMIN B-12 PO) Take 1 tablet by mouth 3 (three) times daily. (Patient not taking: Reported on 01/05/2023)     ferrous sulfate 324 (65 Fe) MG TBEC Take 1 tablet (324 mg total) by mouth every other day. 30 tablet 3   polyethylene glycol (MIRALAX / GLYCOLAX) 17 g packet Take 17 g by  mouth daily as needed. 14 each 0   Probiotic Product (PROBIOTIC PO) Take 1 capsule by mouth at bedtime.     Tiotropium Bromide Monohydrate (SPIRIVA RESPIMAT) 2.5 MCG/ACT AERS Inhale 2 puffs into the lungs daily. 1 each 6   No current facility-administered medications for this visit.     Assessment & Plan    No problem-specific Assessment & Plan notes found for this encounter.   Phillips Hay, PharmD CPP [facility] [address]  [place], [place] [address] [phone]  02/03/2023, 6:33 AM

## 2023-02-05 ENCOUNTER — Telehealth: Payer: Self-pay | Admitting: Student

## 2023-02-05 LAB — URINE CULTURE

## 2023-02-05 NOTE — Telephone Encounter (Signed)
Called patient to follow-up given recent UTI infection.  Informed patient that her recent results showed that she had multi resistant E. coli UTI.  Urine culture showed that the E. coli was resistant to multiple abx and levofloxacin was intermediate, however ciprofloxacin was not listed on patient's susceptibility lab.  Speaking to patient she says she is feeling much better and has been afebrile for over 48 hours since being treated for UTI.  Symptoms are much improved and she is feeling better.  Still taking her ciprofloxacin as prescribed.

## 2023-03-13 ENCOUNTER — Telehealth (HOSPITAL_COMMUNITY): Payer: Self-pay | Admitting: *Deleted

## 2023-03-13 NOTE — Telephone Encounter (Signed)
 Attempted to call patient regarding upcoming cardiac MRI appointment. Left message on voicemail with name and callback number Johney Frame RN Navigator Cardiac Imaging [facility] [phone] Office

## 2023-03-16 ENCOUNTER — Ambulatory Visit (HOSPITAL_COMMUNITY)
Admission: RE | Admit: 2023-03-16 | Discharge: 2023-03-16 | Disposition: A | Discharge status: Home / Self Care | Payer: Managed Care, Other (non HMO) | Source: Ambulatory Visit | Attending: Cardiology | Admitting: Cardiology

## 2023-03-16 ENCOUNTER — Other Ambulatory Visit: Payer: Self-pay | Admitting: Cardiology

## 2023-03-16 DIAGNOSIS — I517 Cardiomegaly: Secondary | ICD-10-CM | POA: Insufficient documentation

## 2023-03-16 MED ORDER — GADOBUTROL 1 MMOL/ML IV SOLN
10.0000 mL | Freq: Once | INTRAVENOUS | Status: AC | PRN
  Administered 2023-03-16: 10 mL via INTRAVENOUS

## 2023-03-18 ENCOUNTER — Ambulatory Visit: Payer: Managed Care, Other (non HMO) | Attending: Cardiology | Admitting: Cardiology

## 2023-03-24 ENCOUNTER — Ambulatory Visit: Payer: Managed Care, Other (non HMO) | Attending: Cardiology | Admitting: Pharmacist

## 2023-03-24 ENCOUNTER — Encounter: Payer: Self-pay | Admitting: Pharmacist

## 2023-03-24 DIAGNOSIS — M791 Myalgia, unspecified site: Secondary | ICD-10-CM

## 2023-03-24 DIAGNOSIS — I6509 Occlusion and stenosis of unspecified vertebral artery: Secondary | ICD-10-CM | POA: Diagnosis not present

## 2023-03-24 DIAGNOSIS — E782 Mixed hyperlipidemia: Secondary | ICD-10-CM | POA: Diagnosis not present

## 2023-03-24 DIAGNOSIS — Z888 Allergy status to other drugs, medicaments and biological substances status: Secondary | ICD-10-CM

## 2023-03-24 DIAGNOSIS — Z87891 Personal history of nicotine dependence: Secondary | ICD-10-CM

## 2023-03-24 DIAGNOSIS — T466X5D Adverse effect of antihyperlipidemic and antiarteriosclerotic drugs, subsequent encounter: Secondary | ICD-10-CM

## 2023-03-24 NOTE — Progress Notes (Unsigned)
Patient ID: [name]                 DOB: Jul 22, 1960                    MRN: [id number]     HPI: [name] is a 63 y.o. female patient referred to lipid clinic by Robet Leu. Patient of Dr. Odis Hollingshead. PMH is significant for vertebral artery stenosis, HTN, COPD, subarachnoid hemorrhage, smoking and statin intolerance.  Patient presents today to discuss lipid management. Lipid panel overdue. Last drawn in 2023. Patient is fasting.  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   2023 lab results: TC 286, LDL 205, Trigs 220, HDL 38 (03/25/21)  Has family history of CAD on her mothers side. Continues to smoke but is down to 4 cigarettes a day. Does not drink alcohol. Does not eat meals out. All meals prepared at home.  Has tried multiple statins in the past. Some caused myalgias however simvastatin caused a rash.  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 180 tablet 3   Cyanocobalamin (VITAMIN B-12 PO) Take 1 tablet by mouth 3 (three) times daily. (Patient not taking: Reported on 01/05/2023)     ferrous sulfate 324 (65 Fe) MG TBEC Take 1 tablet (324 mg total) by mouth every other day. 30 tablet 3   polyethylene glycol (MIRALAX / GLYCOLAX) 17 g packet Take 17 g by mouth daily as needed. 14 each 0   Probiotic Product (PROBIOTIC PO) Take 1 capsule by  mouth at bedtime.     Tiotropium Bromide Monohydrate (SPIRIVA RESPIMAT) 2.5 MCG/ACT AERS Inhale 2 puffs into the lungs daily. 1 each 6   No current facility-administered medications on file prior to visit.    Allergies  Allergen Reactions   Asa [Aspirin] Nausea Only, Palpitations and Other (See Comments)    GI upset Tremors Reactions to 325mg  dose   Crestor [Rosuvastatin] Other (See Comments)    Myalgias    Flagyl [Metronidazole] Other (See Comments)    Yeast infections   Gluten Meal Nausea And Vomiting and Other (See Comments)    Bloating Flatulence   Milk-Related Compounds Nausea And Vomiting and Other (See Comments)    Bloating Gas   Chantix [Varenicline] Other (See Comments)    Dizziness   Egg-Derived Products Nausea Only   Fish Allergy Hives, Itching and Rash    Specifically tuna   Zocor [Simvastatin] Rash    Assessment/Plan:  1. Hyperlipidemia - Patient last LDL 205 which is above goal of <55. Results over 33 years old however, will update today. Due to statin intolerance and numerous comorbidities, recommend aggressive lipid lowering with PCSK9i or inclisiran.  Discussed mechanism of action of Repatha and inclisiran including dosing, timing of administration, and possible adverse effects. Educated on how to use [employer].   Will  submit [employer] benefits investigation and complete PA for Repatha. Recheck lipid panel in 3 months.  Laural Golden, PharmD, BCACP, CDCES, CPP [address] [place], [place], [address] Phone: [phone], Fax: [phone]

## 2023-03-24 NOTE — Patient Instructions (Addendum)
It was nice meeting you today  We would like your LDL (bad cholesterol) to be less than 70  We will update your lipid panel today  The medication we discussed today is called Repatha. This is a medication you would take once every 2 weeks  I will complete the prior authorization for you and contact you when it is approved  Once you start the medication we will recheck your fasting lipid panel in 2-3 months  Please let us know if you have any questions  Laural Golden, PharmD, BCACP, CDCES, CPP [address] [place], [place], [address] Phone: [phone], Fax: [phone]

## 2023-03-25 LAB — LIPID PANEL
Chol/HDL Ratio: 8.4 {ratio} — ABNORMAL HIGH (ref 0.0–4.4)
Cholesterol, Total: 312 mg/dL — ABNORMAL HIGH (ref 100–199)
HDL: 37 mg/dL — ABNORMAL LOW (ref >39)
LDL Chol Calc (NIH): 251 mg/dL — ABNORMAL HIGH (ref 0–99)
Triglycerides: 130 mg/dL (ref 0–149)
VLDL Cholesterol Cal: 24 mg/dL (ref 5–40)

## 2023-03-26 ENCOUNTER — Ambulatory Visit: Payer: Managed Care, Other (non HMO) | Admitting: Family Medicine

## 2023-03-26 NOTE — Progress Notes (Deleted)
    SUBJECTIVE:   CHIEF COMPLAINT / HPI:   Same day sick visit. Coughing and puking  PERTINENT  PMH / PSH: ***  OBJECTIVE:   There were no vitals taken for this visit.  ***  ASSESSMENT/PLAN:   No problem-specific Assessment & Plan notes found for this encounter.     Gerrit Heck, DO [facility]

## 2023-03-30 ENCOUNTER — Telehealth: Payer: Self-pay | Admitting: Pharmacist

## 2023-03-30 NOTE — Telephone Encounter (Signed)
Called and spoke with patient. Advised that Leqvio requires a separate PA. Has commercial plan, may qualify for copay card. Otherwise would need to pay deductible first. Will send orders to infusion center to help with PA and copay card.

## 2023-04-21 ENCOUNTER — Other Ambulatory Visit (HOSPITAL_COMMUNITY): Payer: Self-pay

## 2023-04-21 ENCOUNTER — Telehealth: Payer: Self-pay | Admitting: Pharmacy Technician

## 2023-04-21 ENCOUNTER — Telehealth: Payer: Self-pay | Admitting: Pharmacist

## 2023-04-21 ENCOUNTER — Encounter: Payer: Self-pay | Admitting: Cardiology

## 2023-04-21 DIAGNOSIS — E782 Mixed hyperlipidemia: Secondary | ICD-10-CM

## 2023-04-21 DIAGNOSIS — I6509 Occlusion and stenosis of unspecified vertebral artery: Secondary | ICD-10-CM

## 2023-04-21 DIAGNOSIS — G720 Drug-induced myopathy: Secondary | ICD-10-CM

## 2023-04-21 DIAGNOSIS — T466X5A Adverse effect of antihyperlipidemic and antiarteriosclerotic drugs, initial encounter: Secondary | ICD-10-CM

## 2023-04-21 NOTE — Telephone Encounter (Signed)
 Pharmacy Patient Advocate Encounter   Received notification from Pt Calls Messages that prior authorization for Repatha is required/requested.   Insurance verification completed.   The patient is insured through [employer] .   Per test claim: The current 04/21/23 day co-pay is, $24.99- one month.  No PA needed at this time. This test claim was processed through [facility]- copay amounts may vary at other pharmacies due to pharmacy/plan contracts, or as the patient moves through the different stages of their insurance plan.

## 2023-04-21 NOTE — Telephone Encounter (Signed)
 Please complete PA for Repatha

## 2023-04-21 NOTE — Telephone Encounter (Signed)
 Thayer Ohm,   Can you please lok int this - I believe you are involved in her care.    Thanks   Brighton, DO, [facility]

## 2023-04-21 NOTE — Telephone Encounter (Signed)
 [name]. F/u:  [name] has been denied due to patient has not failed or tried Repatha. You have the right to appeal if you would like. Phone: [phone] Ref: [id number] [phone] The letter has been scanned to the media tab for your review.  Kim  [email or web address] , plz d/c the tx plan.

## 2023-04-22 MED ORDER — REPATHA SURECLICK 140 MG/ML ~~LOC~~ SOAJ
1.0000 mL | SUBCUTANEOUS | 5 refills | Status: DC
Start: 2023-04-22 — End: 2023-07-02

## 2023-04-22 NOTE — Addendum Note (Signed)
 Addended by: Cheree Ditto on: 04/22/2023 10:09 AM   Modules accepted: Orders

## 2023-05-05 ENCOUNTER — Ambulatory Visit: Payer: Managed Care, Other (non HMO) | Admitting: Cardiology

## 2023-05-06 ENCOUNTER — Ambulatory Visit: Payer: Managed Care, Other (non HMO) | Attending: Cardiology | Admitting: Cardiology

## 2023-05-06 ENCOUNTER — Encounter: Payer: Self-pay | Admitting: Cardiology

## 2023-05-06 VITALS — BP 142/82 | HR 80 | Resp 16 | Ht 64.0 in | Wt 173.8 lb

## 2023-05-06 DIAGNOSIS — I422 Other hypertrophic cardiomyopathy: Secondary | ICD-10-CM | POA: Diagnosis not present

## 2023-05-06 DIAGNOSIS — Z789 Other specified health status: Secondary | ICD-10-CM | POA: Diagnosis not present

## 2023-05-06 DIAGNOSIS — F17210 Nicotine dependence, cigarettes, uncomplicated: Secondary | ICD-10-CM | POA: Diagnosis not present

## 2023-05-06 DIAGNOSIS — I10 Essential (primary) hypertension: Secondary | ICD-10-CM

## 2023-05-06 DIAGNOSIS — E7800 Pure hypercholesterolemia, unspecified: Secondary | ICD-10-CM | POA: Diagnosis not present

## 2023-05-06 DIAGNOSIS — R0989 Other specified symptoms and signs involving the circulatory and respiratory systems: Secondary | ICD-10-CM

## 2023-05-06 MED ORDER — LOSARTAN POTASSIUM 50 MG PO TABS
50.0000 mg | ORAL_TABLET | Freq: Every morning | ORAL | 3 refills | Status: DC
Start: 2023-05-06 — End: 2023-07-02

## 2023-05-06 NOTE — Progress Notes (Signed)
 Cardiology Office Note:  .   Date:  05/06/2023  ID:  [name], DOB Dec 14, 1960, MRN [id number] PCP:  Georganna Skeans, MD  Former Cardiology Providers: NA [facility] Providers Cardiologist:  Tessa Lerner, DO , [facility] (established care 05/30/2020) Electrophysiologist:  None  Click to update primary MD,subspecialty MD or APP then REFRESH:1}    Chief Complaint  Patient presents with   Follow-up    Review cardiac MRI results    History of Present Illness: .   [name] is a 64 y.o. African-American female whose past medical history and cardiovascular risk factors includes:  HCM, Hx of GI bleeding, COVID-19 infection (01/2020), smoking cigarettes, uncontrolled hypertension, hyperlipidemia, subarachnoid hemorrhage in 2012, COPD, postmenopausal female.   Patient was referred to the practice back in 2022 for evaluation of left ventricular hypertrophy.  She had an echocardiogram back in March 2022 which noted preserved LVEF, severe concentric hypertrophy, and peak LV gradient was 234 mmHg (but location w/n the LV nonspecific).  She denied any history of syncope or family history of sudden cardiac death or cardiomyopathy.  Her shortness of breath which was more noticeable with effort related activities is being attributed to her post COVID infection, per patient.  Patient was recommended to undergo cardiac MRI.  I had discontinued chlorthalidone and started her on diltiazem 120 mg p.o. daily as well as losartan.  Patient had a cardiac MRI in January 2025 which notes severe basal septal hypertrophy with SAM consistent with hypertrophic cardiomyopathy.  Patient has done well on diltiazem and losartan; however, these medications were held as she had sustained acute GI bleeding needing endoscopy and interventions.  At the time of discharge she was started on low-dose carvedilol 3.125 mg p.o. twice daily and patient states that she is tolerating it well and blood pressures are better  controlled on this therapy.  She denies anginal chest pain.  But does experience shortness of breath with over exertional activities.  She continues to smoke 5 cigarettes/day and is motivated to stop smoking.  In the interim patient is also been started on Repatha for her hypercholesterolemia.  She is tolerating it well she is due for second dose later this week.  Review of Systems: .   Review of Systems  Cardiovascular:  Positive for dyspnea on exertion and palpitations (intermittent). Negative for chest pain, claudication, irregular heartbeat, leg swelling, near-syncope, orthopnea, paroxysmal nocturnal dyspnea and syncope.  Respiratory:  Negative for shortness of breath.   Hematologic/Lymphatic: Negative for bleeding problem.  Gastrointestinal:  Positive for hematochezia (History of) and melena (History of).    Studies Reviewed:   EKG: EKG Interpretation Date/Time:  Wednesday May 06 2023 14:04:54 EDT Ventricular Rate:  71 PR Interval:  130 QRS Duration:  76 QT Interval:  404 QTC Calculation: 439 R Axis:   15  Text Interpretation: Normal sinus rhythm ST & T wave abnormality, consider lateral ischemia When compared with ECG of 05-Jan-2023 10:06, Nonspecific T wave abnormality has replaced inverted T waves in Anterior leads Confirmed by Tessa Lerner [phone]) on 05/06/2023 2:22:04 PM  Echocardiogram: 04/2020  1. Peak LV gradient 234 mmHg with CW doppler, but location in LV is nonspecific.Marland Kitchen Left ventricular ejection fraction, by estimation, is 60 to 65%. The left ventricle has normal function. The left ventricle has no regional wall motion abnormalities.  There is severe concentric left ventricular hypertrophy. Left ventricular diastolic parameters are indeterminate. The average left ventricular global longitudinal strain is -17.4 %. The global longitudinal strain is normal.  2. Right ventricular systolic function is normal. The right ventricular size is normal.   3. Moderate  pericardial effusion. There is no evidence of cardiac tamponade.   4. The mitral valve is normal in structure. Mild to moderate mitral valve regurgitation. No evidence of mitral stenosis.   5. The aortic valve has an indeterminant number of cusps. There is mild calcification of the aortic valve. There is mild thickening of the aortic valve. Aortic valve regurgitation is not visualized. Mild aortic valve sclerosis is present, with no  evidence of aortic valve stenosis.   6. The inferior vena cava is normal in size with greater than 50% respiratory variability, suggesting right atrial pressure of 3 mmHg.   12/11/2022  1. Apical gradient. Peak velocity 1.85 m/s. Peak gradient 13.6 mmHg. Left  ventricular ejection fraction, by estimation, is 60 to 65%. The left ventricle has normal function. The left ventricle has no regional wall motion abnormalities. There is severe  asymmetric left ventricular hypertrophy of the septal segment. Left ventricular diastolic parameters are consistent with Grade I diastolic dysfunction (impaired relaxation). Elevated left ventricular end-diastolic pressure.   2. Right ventricular systolic function is normal. The right ventricular size is normal. There is normal pulmonary artery systolic pressure.   3. Pericardial effusion unchanged in size compared with echo 04/2020. Moderate pericardial effusion. The pericardial effusion is anterior to the right ventricle. There is no evidence of cardiac tamponade.   4. The mitral valve is normal in structure. Trivial mitral valve regurgitation. No evidence of mitral stenosis.   5. The aortic valve is normal in structure. Aortic valve regurgitation is not visualized. No aortic stenosis is present.   6. The inferior vena cava is normal in size with greater than 50% respiratory variability, suggesting right atrial pressure of 3 mmHg.   CMRI 02/2023 1. Severe basal septal hypertrophy with SAM Septal thickness 19 mm compared to posterior wall  9 mm. Findings more consistent with hypertrophic cardiomyopathy. Suggest echo correlation for gradients. No high risk features in regard to hyper-enhancement   2.  No evidence of amyloid.  Normal T1/T2 and minimally elevated ECV   3.  Normal LVEF 59%   4.  Normal RVEF 55%   5.  Mild LAE   6.  Mild MR   7.  Trivial posterior pericardial effusion.   8.  Estimated cardiac output 2.5 L/min  RADIOLOGY: NA  Risk Assessment/Calculations:   NA   Labs:       Latest Ref Rng & Units 01/05/2023   11:24 AM 08/11/2022    2:35 PM 05/06/2022    5:04 PM  CBC  WBC 3.4 - 10.8 x10E3/uL 7.2  8.1  7.8   Hemoglobin 11.1 - 15.9 g/dL 16.1  09.6  8.8   Hematocrit 34.0 - 46.6 % 43.7  36.4  27.5   Platelets 150 - 450 x10E3/uL 295  362.0  354        Latest Ref Rng & Units 05/01/2022    5:08 PM 04/30/2022    1:21 AM 04/29/2022    8:30 AM  BMP  Glucose 70 - 99 mg/dL 93  045  409   BUN 8 - 27 mg/dL 9  12  21    Creatinine 0.57 - 1.00 mg/dL 8.11  9.14  7.82   BUN/Creat Ratio 12 - 28 10     Sodium 134 - 144 mmol/L 143  137  138   Potassium 3.5 - 5.2 mmol/L 4.1  3.6  3.6   Chloride  96 - 106 mmol/L 105  107  107   CO2 20 - 29 mmol/L 23  23  21    Calcium 8.7 - 10.3 mg/dL 9.4  8.6  9.0       Latest Ref Rng & Units 05/01/2022    5:08 PM 04/30/2022    1:21 AM 04/29/2022    8:30 AM  CMP  Glucose 70 - 99 mg/dL 93  829  562   BUN 8 - 27 mg/dL 9  12  21    Creatinine 0.57 - 1.00 mg/dL 1.30  8.65  7.84   Sodium 134 - 144 mmol/L 143  137  138   Potassium 3.5 - 5.2 mmol/L 4.1  3.6  3.6   Chloride 96 - 106 mmol/L 105  107  107   CO2 20 - 29 mmol/L 23  23  21    Calcium 8.7 - 10.3 mg/dL 9.4  8.6  9.0   Total Protein 6.5 - 8.1 g/dL   6.6   Total Bilirubin 0.3 - 1.2 mg/dL   0.6   Alkaline Phos 38 - 126 U/L   65   AST 15 - 41 U/L   19   ALT 0 - 44 U/L   13     Lab Results  Component Value Date   CHOL 312 (H) 03/24/2023   HDL 37 (L) 03/24/2023   LDLCALC 251 (H) 03/24/2023   TRIG 130 03/24/2023   CHOLHDL  8.4 (H) 03/24/2023   No results for input(s): "LIPOA" in the last 8760 hours. No components found for: "NTPROBNP" No results for input(s): "PROBNP" in the last 8760 hours. No results for input(s): "TSH" in the last 8760 hours.   Physical Exam:    Today's Vitals   05/06/23 1358  BP: (!) 142/82  Pulse: 80  Resp: 16  Weight: 173 lb 12.8 oz (78.8 kg)  Height: 5\' 4"  (1.626 m)   Body mass index is 29.83 kg/m. Wt Readings from Last 3 Encounters:  05/06/23 173 lb 12.8 oz (78.8 kg)  02/02/23 180 lb 9.6 oz (81.9 kg)  01/05/23 179 lb (81.2 kg)    Physical Exam  Constitutional: No distress.  hemodynamically stable  Neck: No JVD present.  Cardiovascular: Normal rate, regular rhythm, S1 normal and S2 normal. Exam reveals no gallop, no S3 and no S4.  Murmur heard. Systolic murmur is present with a grade of 3/6. Pulses:      Carotid pulses are  on the right side with bruit and  on the left side with bruit. Pulmonary/Chest: Effort normal and breath sounds normal. No stridor. She has no wheezes. She has no rales.  Musculoskeletal:        General: No edema.     Cervical back: Neck supple.  Skin: Skin is warm.     Impression & Recommendation(s):  Impression:   ICD-10-CM   1. Hypertrophic cardiomyopathy (HCC)  I42.2 ECHOCARDIOGRAM COMPLETE    Ambulatory referral to HOCM    2. Pure hypercholesterolemia  E78.00 EKG 12-Lead    3. Statin intolerance  Z78.9     4. Benign hypertension  I10 Basic metabolic panel    losartan (COZAAR) 50 MG tablet    Basic metabolic panel    5. Cigarette smoker  F17.210     6. Bilateral carotid bruits  R09.89 VAS US CAROTID       Recommendation(s):  Hypertrophic cardiomyopathy (HCC) Initially suspected based on transthoracic echo in the past, but now confirmed by cardiac MRI. Component of hypertensive  heart disease cannot be entirely ruled out as she has had long history of uncontrolled hypertension.  The gradients that are noted on prior  echocardiograms have been inconsistent and I suspect that the gradients reported on the echo from 2022 may be overestimated.  Recommend repeating echocardiogram as her blood pressure is now better controlled to reevaluate obstructive physiology.    I would like to refer her to HCM clinic with Dr. Raynelle Jan for him to review the case and make any necessary recommendations going forward.    Patient is advised to see Dr. Raynelle Jan after the echocardiogram has been performed.  Patient prefers to be on carvedilol as opposed to diltiazem for now.  Will start losartan 50 mg p.o. every morning with follow-up BMP in 1 week.  Pure hypercholesterolemia Statin intolerance Currently on Repatha 140 mg injection every 14 days. Follows with Pharm.D. Will need repeat fasting lipids in the coming weeks to reevaluate her lipid profile  Benign hypertension Office blood pressures are acceptable but not at goal. As mentioned above will start losartan 50 mg p.o. every morning with follow-up labs in 1 week  Cigarette smoker Tobacco cessation counseling: Currently smoking 5 cigarettes/day Patient denies claudication Patient is informed to follow-up with PCP and consider lung cancer screening if and when appropriate. Consider screening for peripheral artery disease with ankle-brachial index-defer to PCP.  She is informed of the dangers of tobacco abuse including stroke, cancer, and MI, as well as benefits of tobacco cessation. She is willing to quit at this time. 5 mins were spent counseling patient cessation techniques. We discussed various methods to help quit smoking, including deciding on a date to quit, joining a support group, pharmacological agents- nicotine gum/patch/lozenges.  I will reassess her progress at the next follow-up visit  Bilateral carotid bruits Asymptomatic. Incidentally found on physical examination. Carotid duplex to evaluate disease burden  Orders Placed:  Orders Placed  This Encounter  Procedures   Basic metabolic panel    Standing Status:   Future    Number of Occurrences:   1    Expiration Date:   05/05/2024   Ambulatory referral to HOCM    Referral Priority:   Routine    Referral Type:   Consultation    Referral Reason:   Specialty Services Required    Referred to Provider:   Christell Constant, MD    Number of Visits Requested:   1   EKG 12-Lead   ECHOCARDIOGRAM COMPLETE    Standing Status:   Future    Expected Date:   05/13/2023    Expiration Date:   05/05/2024    Where should this test be performed:   [facility])    Does the patient weigh less than or greater than 250 lbs?:   Patient weighs less than 250 lbs    Perflutren DEFINITY (image enhancing agent) should be administered unless hypersensitivity or allergy exist:   Administer Perflutren    Reason for exam-Echo:   Other-Full Diagnosis List    Full ICD-10/Reason for Exam:   Hypertrophic cardiomyopathy (HCC) [469629]   Final Medication List:    Meds ordered this encounter  Medications   losartan (COZAAR) 50 MG tablet    Sig: Take 1 tablet (50 mg total) by mouth in the morning.    Dispense:  30 tablet    Refill:  3    There are no discontinued medications.   Current Outpatient Medications:    Evolocumab (REPATHA SURECLICK) 140 MG/ML SOAJ,  Inject 140 mg into the skin every 14 (fourteen) days., Disp: 2 mL, Rfl: 5   losartan (COZAAR) 50 MG tablet, Take 1 tablet (50 mg total) by mouth in the morning., Disp: 30 tablet, Rfl: 3   polyethylene glycol (MIRALAX / GLYCOLAX) 17 g packet, Take 17 g by mouth daily as needed., Disp: 14 each, Rfl: 0   Probiotic Product (PROBIOTIC PO), Take 1 capsule by mouth at bedtime., Disp: , Rfl:    Tiotropium Bromide Monohydrate (SPIRIVA RESPIMAT) 2.5 MCG/ACT AERS, Inhale 2 puffs into the lungs daily., Disp: 1 each, Rfl: 6   carvedilol (COREG) 3.125 MG tablet, Take 1 tablet (3.125 mg total) by mouth 2 (two) times daily., Disp: 180  tablet, Rfl: 3   Cyanocobalamin (VITAMIN B-12 PO), Take 1 tablet by mouth 3 (three) times daily. (Patient not taking: Reported on 05/06/2023), Disp: , Rfl:    ferrous sulfate 324 (65 Fe) MG TBEC, Take 1 tablet (324 mg total) by mouth every other day. (Patient not taking: Reported on 05/06/2023), Disp: 30 tablet, Rfl: 3  Consent:   NA  Disposition:   3 months follow-up sooner if needed  Her questions and concerns were addressed to her satisfaction. She voices understanding of the recommendations provided during this encounter.    Signed, Tessa Lerner, DO, [facility]  [facility]  [address] [place], [place] [address] 05/06/2023 7:37 PM

## 2023-05-06 NOTE — Patient Instructions (Addendum)
 Medication Instructions:  Your physician has recommended you make the following change in your medication:   START Losartan 50 mg once daily in the morning    *If you need a refill on your cardiac medications before your next appointment, please call your pharmacy*  Lab Work: To be completed in 1 week: BMP  If you have labs (blood work) drawn today and your tests are completely normal, you will receive your results only by: MyChart Message (if you have MyChart) OR A paper copy in the mail If you have any lab test that is abnormal or we need to change your treatment, we will call you to review the results.  Testing/Procedures: Your physician has requested that you have an echocardiogram. Echocardiography is a painless test that uses sound waves to create images of your heart. It provides your doctor with information about the size and shape of your heart and how well your heart's chambers and valves are working. This procedure takes approximately one hour. There are no restrictions for this procedure. Please do NOT wear cologne, perfume, aftershave, or lotions (deodorant is allowed). Please arrive 15 minutes prior to your appointment time.  Please note: We ask at that you not bring children with you during ultrasound (echo/ vascular) testing. Due to room size and safety concerns, children are not allowed in the ultrasound rooms during exams. Our front office staff cannot provide observation of children in our lobby area while testing is being conducted. An adult accompanying a patient to their appointment will only be allowed in the ultrasound room at the discretion of the ultrasound technician under special circumstances. We apologize for any inconvenience.   Your physician has requested that you have a carotid duplex. This test is an ultrasound of the carotid arteries in your neck. It looks at blood flow through these arteries that supply the brain with blood. Allow one hour for this exam.  There are no restrictions or special instructions.   Follow-Up: At [facility], you and your health needs are our priority.  As part of our continuing mission to provide you with exceptional heart care, we have created designated Provider Care Teams.  These Care Teams include your primary Cardiologist (physician) and Advanced Practice Providers (APPs -  Physician Assistants and Nurse Practitioners) who all work together to provide you with the care you need, when you need it.  We recommend signing up for the patient portal called "MyChart".  Sign up information is provided on this After Visit Summary.  MyChart is used to connect with patients for Virtual Visits (Telemedicine).  Patients are able to view lab/test results, encounter notes, upcoming appointments, etc.  Non-urgent messages can be sent to your provider as well.   To learn more about what you can do with MyChart, go to [email or web address].    Your next appointment:   3 month(s) (after appt with Dr. Izora Ribas)  The format for your next appointment:   In Person  Provider:   Tessa Lerner, DO {  Other Instructions You have been referred to Dr. Izora Ribas for hypertrophic cardiomyopathy.

## 2023-05-19 LAB — BASIC METABOLIC PANEL
BUN/Creatinine Ratio: 15 (ref 12–28)
BUN: 10 mg/dL (ref 8–27)
CO2: 25 mmol/L (ref 20–29)
Calcium: 9.3 mg/dL (ref 8.7–10.3)
Chloride: 105 mmol/L (ref 96–106)
Creatinine, Ser: 0.67 mg/dL (ref 0.57–1.00)
Glucose: 87 mg/dL (ref 70–99)
Potassium: 4.1 mmol/L (ref 3.5–5.2)
Sodium: 143 mmol/L (ref 134–144)
eGFR: 98 mL/min/{1.73_m2} (ref >59)

## 2023-06-10 ENCOUNTER — Other Ambulatory Visit: Payer: Self-pay

## 2023-06-10 ENCOUNTER — Encounter (HOSPITAL_COMMUNITY): Payer: Self-pay

## 2023-06-10 ENCOUNTER — Emergency Department (HOSPITAL_COMMUNITY)
Admission: EM | Admit: 2023-06-10 | Discharge: 2023-06-10 | Discharge status: Against Medical Advice | Attending: Emergency Medicine | Admitting: Emergency Medicine

## 2023-06-10 ENCOUNTER — Emergency Department (HOSPITAL_BASED_OUTPATIENT_CLINIC_OR_DEPARTMENT_OTHER)

## 2023-06-10 ENCOUNTER — Ambulatory Visit: Payer: Self-pay

## 2023-06-10 ENCOUNTER — Encounter (HOSPITAL_BASED_OUTPATIENT_CLINIC_OR_DEPARTMENT_OTHER): Payer: Self-pay

## 2023-06-10 ENCOUNTER — Emergency Department (HOSPITAL_BASED_OUTPATIENT_CLINIC_OR_DEPARTMENT_OTHER)
Admission: EM | Admit: 2023-06-10 | Discharge: 2023-06-10 | Disposition: A | Discharge status: Home / Self Care | Source: Home / Self Care | Attending: Emergency Medicine | Admitting: Emergency Medicine

## 2023-06-10 DIAGNOSIS — Z5321 Procedure and treatment not carried out due to patient leaving prior to being seen by health care provider: Secondary | ICD-10-CM | POA: Diagnosis not present

## 2023-06-10 DIAGNOSIS — R42 Dizziness and giddiness: Secondary | ICD-10-CM | POA: Insufficient documentation

## 2023-06-10 DIAGNOSIS — R110 Nausea: Secondary | ICD-10-CM | POA: Insufficient documentation

## 2023-06-10 LAB — BASIC METABOLIC PANEL WITH GFR
Anion gap: 8 (ref 5–15)
BUN: 13 mg/dL (ref 8–23)
CO2: 29 mmol/L (ref 22–32)
Calcium: 10.1 mg/dL (ref 8.9–10.3)
Chloride: 101 mmol/L (ref 98–111)
Creatinine, Ser: 0.75 mg/dL (ref 0.44–1.00)
GFR, Estimated: >60 mL/min (ref >60)
Glucose, Bld: 91 mg/dL (ref 70–99)
Potassium: 4 mmol/L (ref 3.5–5.1)
Sodium: 138 mmol/L (ref 135–145)

## 2023-06-10 LAB — CBC
HCT: 42.4 % (ref 36.0–46.0)
Hemoglobin: 13.8 g/dL (ref 12.0–15.0)
MCH: 27.9 pg (ref 26.0–34.0)
MCHC: 32.5 g/dL (ref 30.0–36.0)
MCV: 85.8 fL (ref 80.0–100.0)
Platelets: 319 10*3/uL (ref 150–400)
RBC: 4.94 MIL/uL (ref 3.87–5.11)
RDW: 13.3 % (ref 11.5–15.5)
WBC: 6.6 10*3/uL (ref 4.0–10.5)
nRBC: 0 % (ref 0.0–0.2)

## 2023-06-10 LAB — URINALYSIS, ROUTINE W REFLEX MICROSCOPIC
Bilirubin Urine: NEGATIVE
Glucose, UA: NEGATIVE mg/dL
Hgb urine dipstick: NEGATIVE
Ketones, ur: NEGATIVE mg/dL
Leukocytes,Ua: NEGATIVE
Nitrite: NEGATIVE
Protein, ur: NEGATIVE mg/dL
Specific Gravity, Urine: 1.011 (ref 1.005–1.030)
pH: 7 (ref 5.0–8.0)

## 2023-06-10 LAB — CBG MONITORING, ED: Glucose-Capillary: 114 mg/dL — ABNORMAL HIGH (ref 70–99)

## 2023-06-10 MED ORDER — METOCLOPRAMIDE HCL 5 MG/ML IJ SOLN: 5.0000 mg | Freq: Once | INTRAMUSCULAR | Status: DC

## 2023-06-10 MED ORDER — SODIUM CHLORIDE 0.9 % IV BOLUS: 1000.0000 mL | Freq: Once | INTRAVENOUS | Status: DC

## 2023-06-10 MED ORDER — MECLIZINE HCL 50 MG PO TABS: 50.0000 mg | ORAL_TABLET | Freq: Three times a day (TID) | ORAL | 0 refills | Status: AC | PRN

## 2023-06-10 MED ORDER — CETIRIZINE HCL 10 MG PO TABS: 10.0000 mg | ORAL_TABLET | Freq: Every day | ORAL | 0 refills | Status: AC

## 2023-06-10 MED ORDER — CETIRIZINE HCL 10 MG PO TABS: 10.0000 mg | ORAL_TABLET | Freq: Every day | ORAL | 0 refills | Status: DC

## 2023-06-10 MED ORDER — MECLIZINE HCL 25 MG PO TABS: 25.0000 mg | ORAL_TABLET | Freq: Once | ORAL | Status: DC

## 2023-06-10 MED ORDER — DIPHENHYDRAMINE HCL 50 MG/ML IJ SOLN: 12.5000 mg | Freq: Once | INTRAMUSCULAR | Status: DC

## 2023-06-10 MED ORDER — MECLIZINE HCL 50 MG PO TABS: 50.0000 mg | ORAL_TABLET | Freq: Three times a day (TID) | ORAL | 0 refills | Status: DC | PRN

## 2023-06-10 MED ORDER — MECLIZINE HCL 25 MG PO TABS
50.0000 mg | ORAL_TABLET | Freq: Once | ORAL | Status: AC
  Administered 2023-06-10: 50 mg via ORAL
  Filled 2023-06-10: qty 2

## 2023-06-10 NOTE — Telephone Encounter (Signed)
  Chief Complaint: dizziness Symptoms: nausea, HTN Frequency: today Pertinent Negatives: Patient denies current nausea Disposition: [x] ED /[] Urgent Care (no appt availability in office) / [] Appointment(In office/virtual)/ []  [facility]/ [] Home Care/ [x] Refused Recommended Disposition /[] [facility]/ []  Follow-up with PCP Additional Notes: Pt c/o dizziness, nausea and HTN (reported by EMS). Pt was at work today and EMS was called d/t dizziness and nausea. Pt reports that EMS brought her to ED and MD suggested MRI to r/o stroke. Pt is currently waiting and no longer wishes to stay in ED and requesting note from PCP to return back to work. Triager strongly recommended pt stay in ED for evaluation and explained to pt that EMS brought her to ED because their assessment warranted ED evaluation. Pt did not agree, and then thanked triager and disconnected call.    Copied from CRM [id number]. Topic: Clinical - Red Word Triage >> Jun 10, 2023  1:58 PM Ivette P wrote: [facility] that prompted transfer to Nurse Triage: pt has dizziness and wants to get an appt. Currently at ER and waiting to be seen. Does not want to wait to be told she has vertigo. Pt needs to be seen by a doctor to get note from work. Would ike md wilson. Reason for Disposition  Sounds like a life-threatening emergency to the triager  Answer Assessment - Initial Assessment Questions 1. DESCRIPTION: "Describe your dizziness."     Dizziness/nausea, elevated BP Reports was brought to ED from EMS - pt was feeling dizzy, nausea at work and EMS was called. Pt reports EMS reported elevated BP, and gave nausea medications and brought pt to ED. Pt reports that ED MD suggested MRI to r/o stroke, but pt has been waiting for hours and scan has not been done. Pt requesting note from PCP office to go back to work because she no longer wishes to wait in the ED. Triager advised that if EMS brought her to the ED, their disposition  warranted an ED evaluation. Triager strongly encouraged pt to stay in ED, pt stated "I do not want to wait here, let me go find a doctor" and then pt said "thank you, goodbye" and call was disconnected.  Protocols used: Dizziness - Vertigo-A-AH

## 2023-06-10 NOTE — ED Triage Notes (Signed)
 Patient arrives with complaints of dizziness that started overnight. Patient was seen at another ED for the same today, but left due to the wait times.

## 2023-06-10 NOTE — ED Provider Triage Note (Signed)
 Emergency Medicine Provider Triage Evaluation Note  [name] , a 63 y.o. female  was evaluated in triage.  Pt complains of dizziness.  Review of Systems  Positive: dizziness Negative: Chest pain, abdominal pain  Physical Exam  BP (!) 203/71   Pulse 63   Temp 97.9 F (36.6 C)   Resp 18   SpO2 98%  Gen:   Awake, no distress   Resp:  Normal effort  MSK:   Moves extremities without difficulty  Other:  CN II- XII intact.  Gait deferred, patient wearing heels  Medical Decision Making  Medically screening exam initiated at 11:42 AM.  Appropriate orders placed.  [name] was informed that the remainder of the evaluation will be completed by another provider, this initial triage assessment does not replace that evaluation, and the importance of remaining in the ED until their evaluation is complete.  63 year old female with a chief complaint of dizziness.  Worse with head movement.  Has some left-sided fast going nystagmus.  She is able to ambulate without much issue.  She is actually wearing high heels today.  Will attempt to treat her symptoms.  Blood work.  Bolus of IV fluids.  Reassess.   Brittany Hughs, DO 06/10/23 1143

## 2023-06-10 NOTE — Telephone Encounter (Signed)
 Noted.

## 2023-06-10 NOTE — ED Provider Notes (Signed)
 [facility] AT [facility] Provider Note   CSN: [id number] Arrival date & time: 06/10/23  1440     History Chief Complaint  Patient presents with   Dizziness    HPI [name] is a 63 y.o. female presenting for chief complaint of vertiginous dizziness since 9pm last night. States that she gets a spinning sensation anytime that she tries to change position. Has Nausea Denies FCApV Cp SOB. Ambulatory tolerating PO intake.   Patient's recorded medical, surgical, social, medication list and allergies were reviewed in the Snapshot window as part of the initial history.   Review of Systems   Review of Systems  Constitutional:  Negative for chills and fever.  HENT:  Negative for ear pain and sore throat.   Eyes:  Negative for pain and visual disturbance.  Respiratory:  Negative for cough and shortness of breath.   Cardiovascular:  Negative for chest pain and palpitations.  Gastrointestinal:  Positive for nausea. Negative for abdominal pain and vomiting.  Genitourinary:  Negative for dysuria and hematuria.  Musculoskeletal:  Negative for arthralgias and back pain.  Skin:  Negative for color change and rash.  Neurological:  Positive for dizziness. Negative for seizures and syncope.  All other systems reviewed and are negative.   Physical Exam Updated Vital Signs BP (!) 168/73   Pulse 69   Temp 97.8 F (36.6 C) (Oral)   Resp 16   Ht 5\' 4"  (1.626 m)   Wt 78.8 kg   SpO2 95%   BMI 29.82 kg/m  Physical Exam Vitals and nursing note reviewed.  Constitutional:      General: She is not in acute distress.    Appearance: She is well-developed.  HENT:     Head: Normocephalic and atraumatic.  Eyes:     Conjunctiva/sclera: Conjunctivae normal.  Cardiovascular:     Rate and Rhythm: Normal rate and regular rhythm.     Heart sounds: No murmur heard. Pulmonary:     Effort: Pulmonary effort is normal. No respiratory distress.     Breath sounds:  Normal breath sounds.  Abdominal:     General: There is no distension.     Palpations: Abdomen is soft.     Tenderness: There is no abdominal tenderness. There is no right CVA tenderness or left CVA tenderness.  Musculoskeletal:        General: No swelling or tenderness. Normal range of motion.     Cervical back: Neck supple.  Skin:    General: Skin is warm and dry.  Neurological:     General: No focal deficit present.     Mental Status: She is alert and oriented to person, place, and time. Mental status is at baseline.     Cranial Nerves: No cranial nerve deficit.      ED Course/ Medical Decision Making/ A&P    Procedures Procedures   Medications Ordered in ED Medications  meclizine (ANTIVERT) tablet 50 mg (50 mg Oral Given 06/10/23 1712)   Medical Decision Making:   [name] is a 63 y.o. female who presented to the ED today with dizziness detailed above.    Additional history discussed with patient's family/caregivers.  Patient placed on continuous vitals and telemetry monitoring while in ED which was reviewed periodically.  Complete initial physical exam performed, notably the patient  was HDS inNAD.    I personally performed a HINTS exam Head impulse test: Patient does have a corrective saccade to the right. Nystagmus: Patient does have nystagmus  to the right on contralateral directional gaze. Test of skew: No skew. Hints exam localizes to the peripheral nervous system.  Reviewed and confirmed nursing documentation for past medical history, family history, social history.    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 Other diagnoses were considered including (but not limited to) CVA, Labyrinthitis, Vestibular Neuritis, Mnire's disease, occipital migraine, Inner Ear infection. These are considered less likely due to history of present illness and  physical exam findings.   This is most consistent with an acute life/limb threatening illness complicated by underlying chronic conditions. Notably, due to duration of symptoms being greater than 4 and half hours, patient is not a candidate for code stroke activation.  Initial Plan:  CT head to evaluate for intracranial hemorrhage or intracranial mass as etiology. Due to duration of symptoms, would expect reasonable sensitivity of this test for CVA.  Screening labs including CBC and Metabolic panel to evaluate for infectious or metabolic etiology of disease.  Urinalysis with reflex culture ordered to evaluate for UTI or relevant urologic/nephrologic pathology.  CXR to evaluate for structural/infectious intrathoracic pathology.  EKG to evaluate for cardiac pathology Objective evaluation as below reviewed with plan for reassessment after administration of Meclizine.  Initial Study Results:   Laboratory  All laboratory results reviewed without evidence of clinically relevant pathology.     EKG EKG was reviewed independently. Rate, rhythm, axis, intervals all examined and without medically relevant abnormality. ST segments without concerns for elevations.    Radiology:  All images reviewed independently. Agree with radiology report at this time.   CT HEAD WO CONTRAST ( ) Result Date: 06/10/2023 CLINICAL DATA:  Neuro deficit, acute, stroke suspected. Dizziness beginning last night. EXAM: CT HEAD WITHOUT CONTRAST TECHNIQUE: Contiguous axial images were obtained from the base of the skull through the vertex without intravenous contrast. RADIATION DOSE REDUCTION: This exam was performed according to the departmental dose-optimization program which includes automated exposure control, adjustment of the mA and/or kV according to patient size and/or use of iterative reconstruction technique. COMPARISON:  09/09/2010 FINDINGS: Brain: The brain shows a normal appearance without evidence of malformation,  atrophy, old or acute small or large vessel infarction, mass lesion, hemorrhage, hydrocephalus or extra-axial collection. Vascular: There is atherosclerotic calcification of the major vessels at the base of the brain. Skull: Normal.  No traumatic finding.  No focal bone lesion. Sinuses/Orbits: No inflammatory sinus disease. Insignificant right ethmoid osteoma. Old medial wall blowout fracture of the left orbit. Other: No fluid in the middle ears or mastoid air cells on either side. IMPRESSION: 1. No acute CT finding. Normal appearance of the brain itself. 2. Old medial wall blowout fracture of the left orbit. Electronically Signed   By: Bettylou Brunner M.D.   On: 06/10/2023 17:59     Reassessment and Plan:   Reviewed patient's external labs as above from this morning.  They showed no acute pathology. Patient is hints negative for central nervous system lesion and consistent with peripheral lesion.  Her CT head shows no acute pathology.  After meclizine, she is now ambulatory tolerating p.o. intake in no acute distress. She is endorsing substantial improvement with this medication, will use in the outpatient setting. I had a shared medical decision making conversation with the patient regarding transfer for MRI for complete evaluation for CVA.  Patient declined stating that she would rather follow-up in the outpatient setting secondary to already being  at the tertiary care hospital earlier today for 12 hours without completing her evaluation and expressed understanding of risk of interval disease and missed disease based on limitations of today's evaluation without MRI.  Disposition:  I have considered need for hospitalization, however, considering all of the above, I believe this patient is stable for discharge at this time.  Patient/family educated about specific return precautions for given chief complaint and symptoms.  Patient/family educated about follow-up with PCP and neurology.     Patient/family  expressed understanding of return precautions and need for follow-up. Patient spoken to regarding all imaging and laboratory results and appropriate follow up for these results. All education provided in verbal form with additional information in written form. Time was allowed for answering of patient questions. Patient discharged.    Emergency Department Medication Summary:   Medications  meclizine (ANTIVERT) tablet 50 mg (50 mg Oral Given 06/10/23 1712)          Clinical Impression:  1. Dizziness      Discharge   Final Clinical Impression(s) / ED Diagnoses Final diagnoses:  Dizziness    Rx / DC Orders ED Discharge Orders          Ordered    meclizine (ANTIVERT) 50 MG tablet  3 times daily PRN,   Status:  Discontinued        06/10/23 1819    cetirizine (ZYRTEC ALLERGY) 10 MG tablet  Daily,   Status:  Discontinued        06/10/23 1819    cetirizine (ZYRTEC ALLERGY) 10 MG tablet  Daily        06/10/23 1820    meclizine (ANTIVERT) 50 MG tablet  3 times daily PRN        06/10/23 1820              Onetha Bile, MD 06/10/23 2314

## 2023-06-10 NOTE — ED Triage Notes (Signed)
 Brought in via EMS, Coming from work, last night started with dizziness. Became diaphoretic, abnormal sensations on L arm, EMS states the torniquet tied made it feel better  BP 170 90 (didn't take BP meds this AM) HR90 Bcg 121 R 16 98% RA 4mg  zofran given and has not helped

## 2023-06-12 ENCOUNTER — Ambulatory Visit: Admitting: Cardiology

## 2023-06-12 NOTE — Progress Notes (Deleted)
  Cardiology Office Note:   Date:  06/12/2023  ID:  [name], DOB [birth year], MRN [id number] PCP: Abraham Abo, MD  [facility] Providers Cardiologist:  Olinda Bertrand, DO { Click to update primary MD,subspecialty MD or APP then REFRESH:1}   History of Present Illness:   [name] is a 63 y.o. female ***  Discussed the use of AI scribe software for clinical note transcription with the patient, who gave verbal consent to proceed.  History of Present Illness      Today patient denies chest pain, shortness of breath, lower extremity edema, fatigue, palpitations, melena, hematuria, hemoptysis, diaphoresis, weakness, presyncope, syncope, orthopnea, and PND.   Studies Reviewed:    EKG:        ***  Risk Assessment/Calculations:   {Does this patient have ATRIAL FIBRILLATION?:[phone]} No BP recorded.  {Refresh Note OR Click here to enter BP  :1}***        Physical Exam:   VS:  There were no vitals taken for this visit.   Wt Readings from Last 3 Encounters:  06/10/23 173 lb 11.6 oz (78.8 kg)  05/06/23 173 lb 12.8 oz (78.8 kg)  02/02/23 180 lb 9.6 oz (81.9 kg)     Physical Exam  Physical Exam    ASSESSMENT AND PLAN:     Assessment and Plan Assessment & Plan          {Are you ordering a CV Procedure (e.g. stress test, cath, DCCV, TEE, etc)?   Press F2        :[id number]}   Signed, Leala Prince, PA-C

## 2023-06-24 ENCOUNTER — Ambulatory Visit (HOSPITAL_COMMUNITY): Admission: RE | Admit: 2023-06-24 | Source: Ambulatory Visit

## 2023-07-02 ENCOUNTER — Ambulatory Visit (INDEPENDENT_AMBULATORY_CARE_PROVIDER_SITE_OTHER): Admitting: Family Medicine

## 2023-07-02 ENCOUNTER — Encounter: Payer: Self-pay | Admitting: Family Medicine

## 2023-07-02 VITALS — BP 143/82 | HR 68 | Wt 174.2 lb

## 2023-07-02 DIAGNOSIS — T466X5A Adverse effect of antihyperlipidemic and antiarteriosclerotic drugs, initial encounter: Secondary | ICD-10-CM | POA: Diagnosis not present

## 2023-07-02 DIAGNOSIS — G720 Drug-induced myopathy: Secondary | ICD-10-CM

## 2023-07-02 DIAGNOSIS — I10 Essential (primary) hypertension: Secondary | ICD-10-CM | POA: Diagnosis not present

## 2023-07-02 DIAGNOSIS — E785 Hyperlipidemia, unspecified: Secondary | ICD-10-CM

## 2023-07-02 DIAGNOSIS — F17210 Nicotine dependence, cigarettes, uncomplicated: Secondary | ICD-10-CM

## 2023-07-02 DIAGNOSIS — Z7689 Persons encountering health services in other specified circumstances: Secondary | ICD-10-CM

## 2023-07-02 DIAGNOSIS — D62 Acute posthemorrhagic anemia: Secondary | ICD-10-CM

## 2023-07-02 DIAGNOSIS — E782 Mixed hyperlipidemia: Secondary | ICD-10-CM

## 2023-07-02 DIAGNOSIS — I6509 Occlusion and stenosis of unspecified vertebral artery: Secondary | ICD-10-CM

## 2023-07-02 DIAGNOSIS — Z720 Tobacco use: Secondary | ICD-10-CM

## 2023-07-02 MED ORDER — REPATHA SURECLICK 140 MG/ML ~~LOC~~ SOAJ: 1.0000 mL | SUBCUTANEOUS | 5 refills | Status: DC

## 2023-07-02 MED ORDER — LOSARTAN POTASSIUM 50 MG PO TABS: 50.0000 mg | ORAL_TABLET | Freq: Every morning | ORAL | 3 refills | Status: AC

## 2023-07-02 MED ORDER — CARVEDILOL 3.125 MG PO TABS: 3.1250 mg | ORAL_TABLET | Freq: Two times a day (BID) | ORAL | 3 refills | Status: DC

## 2023-07-02 NOTE — Progress Notes (Signed)
 New Patient Office Visit  Subjective    Patient ID: [name], female    DOB: 01-17-1961  Age: 63 y.o. MRN: [id number]  CC:  Chief Complaint  Patient presents with   New Patient (Initial Visit)   Medication Refill    On everything     HPI [name] presents to establish care and for review of chronic med issues including hypertension, and anemia. Patient reports med compliance and denies acute complaints.    Outpatient Encounter Medications as of 07/02/2023  Medication Sig   cetirizine  (ZYRTEC  ALLERGY) 10 MG tablet Take 1 tablet (10 mg total) by mouth daily.   Cyanocobalamin (VITAMIN B-12 PO) Take 1 tablet by mouth 3 (three) times daily.   ferrous sulfate  324 (65 Fe) MG TBEC Take 1 tablet (324 mg total) by mouth every other day.   meclizine  (ANTIVERT ) 50 MG tablet Take 1 tablet (50 mg total) by mouth 3 (three) times daily as needed.   polyethylene glycol (MIRALAX  / GLYCOLAX ) 17 g packet Take 17 g by mouth daily as needed.   Probiotic Product (PROBIOTIC PO) Take 1 capsule by mouth at bedtime.   Tiotropium Bromide Monohydrate  (SPIRIVA  RESPIMAT) 2.5 MCG/ACT AERS Inhale 2 puffs into the lungs daily.   [DISCONTINUED] Evolocumab  (REPATHA  SURECLICK) 140 MG/ML SOAJ Inject 140 mg into the skin every 14 (fourteen) days.   [DISCONTINUED] losartan  (COZAAR ) 50 MG tablet Take 1 tablet (50 mg total) by mouth in the morning.   carvedilol  (COREG ) 3.125 MG tablet Take 1 tablet (3.125 mg total) by mouth 2 (two) times daily.   Evolocumab  (REPATHA  SURECLICK) 140 MG/ML SOAJ Inject 140 mg into the skin every 14 (fourteen) days.   losartan  (COZAAR ) 50 MG tablet Take 1 tablet (50 mg total) by mouth in the morning.   [DISCONTINUED] carvedilol  (COREG ) 3.125 MG tablet Take 1 tablet (3.125 mg total) by mouth 2 (two) times daily.   No facility-administered encounter medications on file as of 07/02/2023.    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   Hyperlipidemia    Hypertension    Seasonal allergies    Sickle cell trait (HCC)    Stroke (HCC) 2013    Past Surgical History:  Procedure Laterality Date   ABDOMINAL HYSTERECTOMY     ESOPHAGOGASTRODUODENOSCOPY (EGD) WITH PROPOFOL  N/A 04/29/2022   Procedure: ESOPHAGOGASTRODUODENOSCOPY (EGD) WITH PROPOFOL ;  Surgeon: Ace Holder, MD;  Location: [facility] ENDOSCOPY;  Service: Gastroenterology;  Laterality: N/A;   HEMOSTASIS CLIP PLACEMENT  04/29/2022   Procedure: HEMOSTASIS CLIP PLACEMENT;  Surgeon: Ace Holder, MD;  Location: [facility] ENDOSCOPY;  Service: Gastroenterology;;   HOT HEMOSTASIS N/A 04/29/2022   Procedure: HOT HEMOSTASIS (ARGON PLASMA COAGULATION/BICAP);  Surgeon: Ace Holder, MD;  Location: [facility] ENDOSCOPY;  Service: Gastroenterology;  Laterality: N/A;    Family History  Problem Relation Age of Onset   Hyperlipidemia Mother    Hypertension Mother    Diabetes Mother    Renal Disease Mother    Congestive Heart Failure Mother    Hypertension Maternal Grandfather    Diabetes Maternal Grandfather    Heart disease Paternal Grandmother        died from mycardial infarcation   Colon polyps Daughter 1   Colon cancer Neg Hx    Esophageal cancer Neg Hx    Stomach cancer Neg Hx  Rectal cancer Neg Hx     Social History   Socioeconomic History   Marital status: Single    Spouse name: Not on file   Number of children: 1   Years of education: Not on file   Highest education level: Not on file  Occupational History   Not on file  Tobacco Use   Smoking status: Former    Current packs/day: 0.00    Average packs/day: 0.5 packs/day for 42.3 years (21.2 ttl pk-yrs)    Types: Cigarettes    Start date: 02/25/1980    Quit date: 07/01/2022    Years since quitting: 1.0    Passive exposure: Past   Smokeless tobacco: Never   Tobacco comments:     Quit in 2010 for 3 months - Quit 06/2022  Vaping Use   Vaping status: Never Used  Substance and Sexual Activity   Alcohol use: Not Currently    Comment: OCCASIONAL   Drug use: No   Sexual activity: Yes    Birth control/protection: Surgical, Condom  Other Topics Concern   Not on file  Social History Narrative   Financial assistance approved for 100% discount at [facility] and has [facility] card per [employer]   03/14/2010         Social Drivers of Health   Financial Resource Strain: High Risk (08/28/2021)   Overall Financial Resource Strain (CARDIA)    Difficulty of Paying Living Expenses: Very hard  Food Insecurity: No Food Insecurity (04/29/2022)   Hunger Vital Sign    Worried About Running Out of Food in the Last Year: Never true    Ran Out of Food in the Last Year: Never true  Transportation Needs: No Transportation Needs (04/29/2022)   PRAPARE - Administrator, Civil Service (Medical): No    Lack of Transportation (Non-Medical): No  Physical Activity: Not on file  Stress: Not on file  Social Connections: Not on file  Intimate Partner Violence: Not At Risk (04/29/2022)   Humiliation, Afraid, Rape, and Kick questionnaire    Fear of Current or Ex-Partner: No    Emotionally Abused: No    Physically Abused: No    Sexually Abused: No    Review of Systems  All other systems reviewed and are negative.       Objective   BP (!) 143/82 (BP Location: Right Arm, Patient Position: Sitting, Cuff Size: Normal)   Pulse 68   Wt 174 lb 3.2 oz (79 kg)   SpO2 96%   BMI 29.90 kg/m   Physical Exam Vitals and nursing note reviewed.  Constitutional:      General: She is not in acute distress. Cardiovascular:     Rate and Rhythm: Normal rate and regular rhythm.  Pulmonary:     Effort: Pulmonary effort is normal.     Breath sounds: Normal breath sounds.  Abdominal:     Palpations: Abdomen is soft.     Tenderness: There is no abdominal tenderness.  Neurological:     General:  No focal deficit present.     Mental Status: She is alert and oriented to person, place, and time.         Assessment & Plan:   Benign hypertension -     Losartan  Potassium; Take 1 tablet (50 mg total) by mouth in the morning.  Dispense: 90 tablet; Refill: 3  Hyperlipidemia, unspecified hyperlipidemia type  Statin myopathy -     Repatha  SureClick; Inject 140 mg into the skin every 14 (  fourteen) days.  Dispense: 2 mL; Refill: 5  Anemia, posthemorrhagic, acute  Tobacco use  Encounter to establish care  Other orders -     Carvedilol ; Take 1 tablet (3.125 mg total) by mouth 2 (two) times daily.  Dispense: 180 tablet; Refill: 3     Return in about 3 months (around 10/02/2023) for follow up.   Arlo Lama, MD

## 2023-07-03 ENCOUNTER — Encounter: Payer: Self-pay | Admitting: Family Medicine

## 2023-07-06 ENCOUNTER — Encounter: Payer: Managed Care, Other (non HMO) | Admitting: Family

## 2023-07-14 ENCOUNTER — Encounter: Payer: Self-pay | Admitting: Internal Medicine

## 2023-07-14 ENCOUNTER — Ambulatory Visit

## 2023-07-14 ENCOUNTER — Ambulatory Visit: Attending: Genetic Counselor | Admitting: Genetic Counselor

## 2023-07-14 ENCOUNTER — Other Ambulatory Visit: Payer: Self-pay | Admitting: Internal Medicine

## 2023-07-14 ENCOUNTER — Ambulatory Visit: Attending: Internal Medicine | Admitting: Internal Medicine

## 2023-07-14 VITALS — BP 142/80 | HR 70 | Ht 64.0 in | Wt 175.0 lb

## 2023-07-14 DIAGNOSIS — Z8241 Family history of sudden cardiac death: Secondary | ICD-10-CM

## 2023-07-14 DIAGNOSIS — R002 Palpitations: Secondary | ICD-10-CM

## 2023-07-14 DIAGNOSIS — I422 Other hypertrophic cardiomyopathy: Secondary | ICD-10-CM

## 2023-07-14 DIAGNOSIS — Z87891 Personal history of nicotine dependence: Secondary | ICD-10-CM

## 2023-07-14 NOTE — Progress Notes (Addendum)
 Cardiology Office Note:  .    Date:  07/14/2023  ID:  [name], DOB May 16, 1960, MRN [id number] PCP: Abraham Abo, MD  [facility] Providers Cardiologist:  Olinda Bertrand, DO     CC: HCM discussion Consulted for the evaluation of HCM at the behest of Dr. Albert Huff   History of Present Illness: .    [name] is a 63 y.o. female with nonobstructive hypertrophic cardiomyopathy who presents for evaluation of her condition.  She has a history of nonobstructive hypertrophic cardiomyopathy and has undergone multiple imaging studies, including echocardiograms and a cardiac MRI. She experiences heart palpitations, particularly when anxious or lying down. No chest pain, but she notes occasional shortness of breath, which she attributes to her COPD. No syncope or presyncope.  She has COPD and experiences worsening shortness of breath when not taking her breathing medication. If she skips her medication for several days, she feels like her 'sides want to explode.' Currently, she takes her breathing medication daily, which helps manage her symptoms.  She is on medication to manage her cholesterol and has been on it for more than a month. She is interested in monitoring her cholesterol levels to see how they respond to the medication.  Her family history is significant for heart disease. Her grandmother died of a massive heart attack at 83, and her mother had heart disease and kidney failure due to uncontrolled high blood pressure. Her aunts have high blood pressure, and her daughter also has high blood pressure but no known hypertrophic cardiomyopathy.  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 She has a history of a recent ear infection, which caused dizziness and nausea, but these symptoms have resolved.  Discussed the use of AI scribe software for clinical note transcription with  the patient, who gave verbal consent to proceed.   Relevant histories: .  Social  - has a daughter and a sister- no testing - Patient's grandmother had heart attack at 31 - Patient's mother had heart disease - Patient's mother had kidney failure - Patient's daughter has high blood pressure - Patient's sister has cancer of salivary gland  ROS: As per HPI.   Studies Reviewed: .     Cardiac Studies & Procedures   ______________________________________________________________________________________________     ECHOCARDIOGRAM  ECHOCARDIOGRAM COMPLETE 12/11/2022  Narrative ECHOCARDIOGRAM REPORT    Patient Name:   [name] Date of Exam: 12/11/2022 Medical Rec #:  [id number]      Height:       65.0 in Accession #:    [id number]     Weight:       179.2 lb Date of Birth:  1960-10-12      BSA:          1.888 m Patient Age:    62 years       BP:           122/70 mmHg Patient Gender: F              HR:           70 bpm. Exam Location:  Inpatient  Procedure: 2D Echo, Cardiac Doppler and Color Doppler  Indications:    Pericardial effusion I31.3  History:        Patient has prior history of Echocardiogram examinations, most recent 05/04/2020. Carotid Disease and COPD; Risk Factors:Hypertension, Dyslipidemia and Former Smoker.  Sonographer:  Adelia Homestead RVT RCS Referring Phys: [id number] CARINA M BROWN   Sonographer Comments: Technically challenging study due to limited acoustic windows and Technically difficult study due to poor echo windows. Image acquisition challenging due to respiratory motion and Image acquisition challenging due to COPD. IMPRESSIONS   1. Apical gradient. Peak velocity 1.85 m/s. Peak gradient 13.6 mmHg. Left ventricular ejection fraction, by estimation, is 60 to 65%. The left ventricle has normal function. The left ventricle has no regional wall motion abnormalities. There is severe asymmetric left ventricular hypertrophy of the septal segment. Left  ventricular diastolic parameters are consistent with Grade I diastolic dysfunction (impaired relaxation). Elevated left ventricular end-diastolic pressure. 2. Right ventricular systolic function is normal. The right ventricular size is normal. There is normal pulmonary artery systolic pressure. 3. Pericardial effusion unchanged in size compared with echo 04/2020. Moderate pericardial effusion. The pericardial effusion is anterior to the right ventricle. There is no evidence of cardiac tamponade. 4. The mitral valve is normal in structure. Trivial mitral valve regurgitation. No evidence of mitral stenosis. 5. The aortic valve is normal in structure. Aortic valve regurgitation is not visualized. No aortic stenosis is present. 6. The inferior vena cava is normal in size with greater than 50% respiratory variability, suggesting right atrial pressure of 3 mmHg.  Comparison(s): Peak LV gradient 234 mmHg with CW doppler, but location in LV is nonspecific.Aaron Aas Left ventricular ejection fraction, by estimation, is 60 to 65%.   FINDINGS Left Ventricle: Apical gradient. Peak velocity 1.85 m/s. Peak gradient 13.6 mmHg. Left ventricular ejection fraction, by estimation, is 60 to 65%. The left ventricle has normal function. The left ventricle has no regional wall motion abnormalities. The left ventricular internal cavity size was normal in size. There is severe asymmetric left ventricular hypertrophy of the septal segment. Left ventricular diastolic parameters are consistent with Grade I diastolic dysfunction (impaired relaxation). Elevated left ventricular end-diastolic pressure.  Right Ventricle: The right ventricular size is normal. No increase in right ventricular wall thickness. Right ventricular systolic function is normal. There is normal pulmonary artery systolic pressure. The tricuspid regurgitant velocity is 1.08 m/s, and with an assumed right atrial pressure of 3 mmHg, the estimated right ventricular  systolic pressure is 7.7 mmHg.  Left Atrium: Left atrial size was normal in size.  Right Atrium: Right atrial size was normal in size.  Pericardium: Pericardial effusion unchanged in size compared with echo 04/2020. A moderately sized pericardial effusion is present. The pericardial effusion is anterior to the right ventricle. There is no evidence of cardiac tamponade.  Mitral Valve: The mitral valve is normal in structure. Trivial mitral valve regurgitation. No evidence of mitral valve stenosis.  Tricuspid Valve: The tricuspid valve is normal in structure. Tricuspid valve regurgitation is trivial. No evidence of tricuspid stenosis.  Aortic Valve: The aortic valve is normal in structure. Aortic valve regurgitation is not visualized. No aortic stenosis is present. Aortic valve mean gradient measures 6.2 mmHg. Aortic valve peak gradient measures 11.7 mmHg. Aortic valve area, by VTI measures 3.25 cm.  Pulmonic Valve: The pulmonic valve was normal in structure. Pulmonic valve regurgitation is not visualized. No evidence of pulmonic stenosis.  Aorta: The aortic root is normal in size and structure.  Venous: The inferior vena cava is normal in size with greater than 50% respiratory variability, suggesting right atrial pressure of 3 mmHg.  IAS/Shunts: No atrial level shunt detected by color flow Doppler.   LEFT VENTRICLE PLAX 2D LVIDd:  3.90 cm   Diastology LVIDs:         2.70 cm   LV e' medial:    3.92 cm/s LV PW:         1.40 cm   LV E/e' medial:  24.2 LV IVS:        1.80 cm   LV e' lateral:   15.20 cm/s LVOT diam:     1.90 cm   LV E/e' lateral: 6.2 LV SV:         110 LV SV Index:   58 LVOT Area:     2.84 cm   RIGHT VENTRICLE             IVC RV S prime:     15.00 cm/s  IVC diam: 1.20 cm TAPSE (M-mode): 1.6 cm  LEFT ATRIUM           Index        RIGHT ATRIUM          Index LA diam:      3.40 cm 1.80 cm/m   RA Area:     9.14 cm LA Vol (A2C): 21.5 ml 11.39 ml/m  RA  Volume:   19.20 ml 10.17 ml/m LA Vol (A4C): 49.1 ml 26.01 ml/m AORTIC VALVE AV Area (Vmax):    3.06 cm AV Area (Vmean):   3.09 cm AV Area (VTI):     3.25 cm AV Vmax:           171.00 cm/s AV Vmean:          114.750 cm/s AV VTI:            0.339 m AV Peak Grad:      11.7 mmHg AV Mean Grad:      6.2 mmHg LVOT Vmax:         184.50 cm/s LVOT Vmean:        125.000 cm/s LVOT VTI:          0.388 m LVOT/AV VTI ratio: 1.15  AORTA Ao Root diam: 2.60 cm Ao Asc diam:  3.10 cm  MITRAL VALVE                TRICUSPID VALVE MV Area (PHT): 3.21 cm     TR Peak grad:   4.7 mmHg MV Decel Time: 236 msec     TR Vmax:        108.00 cm/s MV E velocity: 94.70 cm/s MV A velocity: 119.00 cm/s  SHUNTS MV E/A ratio:  0.80         Systemic VTI:  0.39 m Systemic Diam: 1.90 cm  Maudine Sos MD Electronically signed by Maudine Sos MD Signature Date/Time: 12/11/2022/4:46:36 PM    Final        CARDIAC MRI  MR CARDIAC MORPHOLOGY W WO CONTRAST 03/16/2023  Narrative CLINICAL DATA:  LVH R/O Amyloid  EXAM: CARDIAC MRI  TECHNIQUE: The patient was scanned on a 1.5 Tesla Siemens magnet. A dedicated cardiac coil was used. Functional imaging was done using Fiesta sequences. 2,3, and 4 chamber views were done to assess for RWMA's. Modified Simpson's rule using a short axis stack was used to calculate an ejection fraction on a dedicated work Research officer, trade [employer]. The patient received 10 cc of Gadavist  After 10 minutes inversion recovery sequences were used to assess for infiltration and scar tissue.  CONTRAST:  Gadavist   FINDINGS: Mild LAE. Normal RA. No PFO/ASD. Trivial posterior pericardial effusion. Normal ascending thoracic aorta 2.9 cm. Tri leaflet  AV with no AR/AS. SAM with some turbulence in LVOT. Normal RA/RV. Mild appearing MR. Normal LV size and function. Severe basal septal hypertrophy thickness 19 mm with posterior wall thickness 9 mm. Delayed enhancement images  with gadolinium show no evidence of scar, infarct or infiltration.  Quantitative LVEF: 59% (EDV 127 cc ESV 52 cc SV 75 cc) Estimated cardiac output 2.5 L/min  Quantitative RVEF: 55% (EDV 101 cc ESV 45 cc SV 56 cc )  Parametric Measures: Using Hct 44  T1: Normal 1033 msec  ECV: Minimally elevated 30%  T2: Normal 48 msec  IMPRESSION: 1. Severe basal septal hypertrophy with SAM Septal thickness 19 mm compared to posterior wall 9 mm. Findings more consistent with hypertrophic cardiomyopathy. Suggest echo correlation for gradients. No high risk features in regard to hyper-enhancement  2.  No evidence of amyloid.  Normal T1/T2 and minimally elevated ECV  3.  Normal LVEF 59%  4.  Normal RVEF 55%  5.  Mild LAE  6.  Mild MR  7.  Trivial posterior pericardial effusion.  8.  Estimated cardiac output 2.5 L/min  Janelle Mediate   Electronically Signed By: Janelle Mediate M.D. On: 03/16/2023 12:25   ______________________________________________________________________________________________       Physical Exam:    VS:  BP (!) 142/80   Pulse 70   Ht 5\' 4"  (1.626 m)   Wt 175 lb (79.4 kg)   SpO2 98%   BMI 30.04 kg/m    Wt Readings from Last 3 Encounters:  07/14/23 175 lb (79.4 kg)  07/02/23 174 lb 3.2 oz (79 kg)  06/10/23 173 lb 11.6 oz (78.8 kg)    Gen: no distress, light smell of smoke   Neck: No JVD Cardiac: No Rubs or Gallops, no murmur, RRR +2 radial pulses Respiratory: Clear to auscultation bilaterally, normal effort, normal  respiratory rate GI: Soft, nontender, non-distended  MS: No  edema;  moves all extremities Integument: Skin feels warm Neuro:  At time of evaluation, alert and oriented to person/place/time/situation  Psych: Normal affect, patient feels ok   ASSESSMENT AND PLAN: .    Hypertrophic nonobstructive cardiomyopathy - NYHA I, non obstructive Hypertrophic nonobstructive cardiomyopathy with septal thickness 19 mm. Asymptomatic for chest  pain, dyspnea, or fatigue. Family history of heart disease and sudden cardiac death. Risk of sudden cardiac death evaluated based on family history and potential non-sustained ventricular tachycardia. Discussed potential symptoms such as dyspnea, lightheadedness, and fatigue, and the importance of monitoring these symptoms. Low risk of sudden cardiac death if heart monitor results are normal; risk increases to 4.04% per year with non-sustained ventricular tachycardia. - Order heart monitor for two weeks to assess for arrhythmias given her palpitations - Schedule treadmill stress echo test to evaluate exercise tolerance and classify for inducible gradient. - Refer to clinical geneticist for potential genetic testing. - Provide anticipatory guidance on symptoms to monitor, such as sudden dyspnea or lightheadedness. - exercise guidance after testing  Palpitations Intermittent palpitations, particularly when supine. No associated chest pain or syncope. Plan to assess for arrhythmias with heart monitor.  Tobacco use disorder Chronic obstructive pulmonary disease (COPD) COPD managed with daily bronchodilator therapy. Symptoms well-controlled with regular medication use. Dyspnea occurs with exertion if medication is missed. - Encourage smoking cessation, especially before trip to [place].  Familial hypercholesterolemia - Planned for f/u labs on PCSK9i  One year with me unless new issues.  Time Spent Directly with Patient:   I have spent a total of 43 minutes with the  patient reviewing notes, imaging, EKGs, labs, and examining the patient as well as establishing an assessment and plan that was discussed personally with the patient. Discussed disease state education , using shared decision making tools and cardiac modeling, and genetic testing options/echo screening. Reviewed care and plan in collaboration with clinical genetics (COHESION).    Gloriann Larger, MD FASE [facility] Cardiologist [facility]  [address] [place], [address] [place], [place] [address] [phone]  3:38 PM

## 2023-07-14 NOTE — Patient Instructions (Signed)
 Medication Instructions:  Your physician recommends that you continue on your current medications as directed. Please refer to the Current Medication list given to you today.  *If you need a refill on your cardiac medications before your next appointment, please call your pharmacy*  Lab Work: NONE  If you have labs (blood work) drawn today and your tests are completely normal, you will receive your results only by: MyChart Message (if you have MyChart) OR A paper copy in the mail If you have any lab test that is abnormal or we need to change your treatment, we will call you to review the results.  Testing/Procedures: Your physician has requested that you have a stress echocardiogram. For further information please visit [email or web address] Please follow instruction sheet as given.  Please note: We ask at that you not bring children with you during ultrasound (echo/ vascular) testing. Due to room size and safety concerns, children are not allowed in the ultrasound rooms during exams. Our front office staff cannot provide observation of children in our lobby area while testing is being conducted. An adult accompanying a patient to their appointment will only be allowed in the ultrasound room at the discretion of the ultrasound technician under special circumstances. We apologize for any inconvenience.   Your physician has requested that you wear a heart monitor.   Follow-Up: At [facility], you and your health needs are our priority.  As part of our continuing mission to provide you with exceptional heart care, our providers are all part of one team.  This team includes your primary Cardiologist (physician) and Advanced Practice Providers or APPs (Physician Assistants and Nurse Practitioners) who all work together to provide you with the care you need, when you need it.  Your next appointment:   12 month(s)  Provider:   Gloriann Larger, MD   Other Instructions [name]- Long  Term Monitor Instructions  Your physician has requested you wear a ZIO patch monitor for 14 days.  This is a single patch monitor. [employer] supplies one patch monitor per enrollment. Additional stickers are not available. Please do not apply patch if you will be having a Nuclear Stress Test,   Cardiac CT, MRI, or Chest Xray during the period you would be wearing the  monitor. The patch cannot be worn during these tests. You cannot remove and re-apply the  ZIO XT patch monitor.  Your ZIO patch monitor will be mailed 3 day USPS to your address on file. It may take 3-5 days  to receive your monitor after you have been enrolled.  Once you have received your monitor, please review the enclosed instructions. Your monitor  has already been registered assigning a specific monitor serial # to you.  Billing and Patient Assistance Program Information  We have supplied [employer] with any of your insurance information on file for billing purposes. [employer] offers a sliding scale Patient Assistance Program for patients that do not have  insurance, or whose insurance does not completely cover the cost of the ZIO monitor.  You must apply for the Patient Assistance Program to qualify for this discounted rate.  To apply, please call [employer] at [phone], select option 4, select option 2, ask to apply for  Patient Assistance Program. Sanna Crystal will ask your household income, and how many people  are in your household. They will quote your out-of-pocket cost based on that information.  [employer] will also be able to set up a 23-month, interest-free payment plan if needed.  Applying the  monitor   Shave hair from upper left chest.  Hold abrader disc by orange tab. Rub abrader in 40 strokes over the upper left chest as  indicated in your monitor instructions.  Clean area with 4 enclosed alcohol pads. Let dry.  Apply patch as indicated in monitor instructions. Patch will be placed under collarbone on left  side of  chest with arrow pointing upward.  Rub patch adhesive wings for 2 minutes. Remove white label marked "1". Remove the white  label marked "2". Rub patch adhesive wings for 2 additional minutes.  While looking in a mirror, press and release button in center of patch. A small green light will  flash 3-4 times. This will be your only indicator that the monitor has been turned on.  Do not shower for the first 24 hours. You may shower after the first 24 hours.  Press the button if you feel a symptom. You will hear a small click. Record Date, Time and  Symptom in the Patient Logbook.  When you are ready to remove the patch, follow instructions on the last 2 pages of Patient  Logbook. Stick patch monitor onto the last page of Patient Logbook.  Place Patient Logbook in the blue and white box. Use locking tab on box and tape box closed  securely. The blue and white box has prepaid postage on it. Please place it in the mailbox as  soon as possible. Your physician should have your test results approximately 7 days after the  monitor has been mailed back to [facility].  Call [facility] Customer Care at [phone] if you have questions regarding  your ZIO XT patch monitor. Call them immediately if you see an orange light blinking on your  monitor.  If your monitor falls off in less than 4 days, contact our Monitor department at [phone].  If your monitor becomes loose or falls off after 4 days call [employer] at [phone] for  suggestions on securing your monitor

## 2023-07-14 NOTE — Progress Notes (Unsigned)
 Enrolled for [employer] to mail a ZIO XT long term holter monitor to the patients address on file.

## 2023-07-24 ENCOUNTER — Ambulatory Visit (HOSPITAL_COMMUNITY)
Admission: RE | Admit: 2023-07-24 | Discharge: 2023-07-24 | Disposition: A | Discharge status: Home / Self Care | Source: Ambulatory Visit | Attending: Cardiology | Admitting: Cardiology

## 2023-07-24 DIAGNOSIS — I422 Other hypertrophic cardiomyopathy: Secondary | ICD-10-CM

## 2023-07-24 DIAGNOSIS — R0989 Other specified symptoms and signs involving the circulatory and respiratory systems: Secondary | ICD-10-CM

## 2023-07-24 LAB — ECHOCARDIOGRAM COMPLETE
AR max vel: 2.59 cm2
AV Area VTI: 2.41 cm2
AV Area mean vel: 2.5 cm2
AV Mean grad: 6 mmHg
AV Peak grad: 11.2 mmHg
Ao pk vel: 1.67 m/s
Area-P 1/2: 2.8 cm2
Est EF: >75
MV M vel: 3.14 m/s
MV Peak grad: 39.4 mmHg
S' Lateral: 2.45 cm

## 2023-07-27 ENCOUNTER — Ambulatory Visit: Payer: Self-pay | Admitting: Cardiology

## 2023-07-27 DIAGNOSIS — I6529 Occlusion and stenosis of unspecified carotid artery: Secondary | ICD-10-CM

## 2023-07-27 DIAGNOSIS — I6521 Occlusion and stenosis of right carotid artery: Secondary | ICD-10-CM

## 2023-07-27 DIAGNOSIS — I771 Stricture of artery: Secondary | ICD-10-CM

## 2023-07-29 ENCOUNTER — Telehealth: Payer: Self-pay | Admitting: Cardiology

## 2023-07-29 NOTE — Telephone Encounter (Signed)
-----   Message from Nurse Armond Lands sent at 07/27/2023  5:32 PM EDT ----- Regarding: Push out f/u with Dr. Lawton Price! Dr. Albert Huff would like to push this pt's f/u appt until after her stress echo is complete to be able to have these results at the visit. Thanks!  Lonni Robert, RN

## 2023-07-29 NOTE — Telephone Encounter (Signed)
 [name]! I reached out to the patient to reschedule her appointment with Dr. Albert Huff but she is not wanting to do the stress test that involves the treadmill w/ exercise. She states that she has heard too many horror stories regarding the treadmill stress test and is requesting the stress test with dye. Please advise.

## 2023-07-31 NOTE — Telephone Encounter (Signed)
 Called pt discussed MD response:  The clinical question here is the evaluation of obstructive inducible hypertrophic cardiomyopathy. Presently, when we met, she had noted symptoms that included exercise-induced heart, palpitations, and shortness of breath. A chemical nuclear medicine stress test would not be able to unmask obstructive physiology; her LVOT gradient was 19 on prior testing. A stress echocardiogram could evaluate for a obstructive HCM that could be treated and potentially improve her symptoms. A squat to stand limited echocardiogram could be performed if she's not amenable to this test. If she'd prefer to not undergo test testing, then we can offer her additional support in smoking sensation to improve her symptom burden.   Pt expresses doesn't want testing d/t potential for heart attack or something happening to her.   Advised pt of reason for stress echo; pt again expressed doesn't want anything to happen to heart.  Advised pt could have an obstruction with regular exercise and have a cardiac event.  With testing activity is in a more controlled environment and she can be monitored.  Which would be better than being out and occur spontaneously.   Pt then expresses will do a squat to stand echocardiogram.  Advised pt someone will reach out to her to schedule testing.

## 2023-07-31 NOTE — Telephone Encounter (Signed)
 Please see the chart - I think it was walking ordered by [facility] for HCM workup.   Brittany Nakamura [place], DO, FACC

## 2023-08-06 ENCOUNTER — Ambulatory Visit: Admitting: Cardiology

## 2023-08-06 ENCOUNTER — Telehealth (HOSPITAL_COMMUNITY): Payer: Self-pay | Admitting: *Deleted

## 2023-08-06 NOTE — Telephone Encounter (Signed)
 Left detailed instructions for stress echo.

## 2023-08-10 ENCOUNTER — Other Ambulatory Visit: Payer: Self-pay | Admitting: Internal Medicine

## 2023-08-10 DIAGNOSIS — I422 Other hypertrophic cardiomyopathy: Secondary | ICD-10-CM

## 2023-08-11 NOTE — Progress Notes (Signed)
 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   Personal Medical Information [name] (III.1 on pedigree) is a 63 year old pleasant African American lady with a wonderful zest for living life to the fullest. 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 She does report having dyspnea that occurred soon after getting COVID and underwent breathing treatments for this. She denies having fatigue, chest discomfort, dizziness or syncope.  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  Traditional Risk Factors [name] was found to have HTN after having COVID19 in 2022 and states that is well-controlled with medication.  Family history  Relation to Proband Pedigree # Current age Heart condition/age of onset Notes  Daughter IV.1 49 None Echo/EKG to be done  Grandchildren, 2 V.1, V.2 23, 18 None         Maternal half-sister III.2 57 None Echo/EKG to be done  Nephews, 3 IV.2-IV.4 ?          Father II.1 Deceased None Died @ 69 in MVA  Paternal uncles/aunts, ? unaware     Paternal grandfather 1.1 Deceased None Died @ ? of ?  Paternal grandmother I.2 Deceased None Died @ ? of ?        Mother II.2 Deceased None Died @ 49- renal failure  Maternal aunts, 2 II.3, II.4 Deceased, 38 None II.4- Died @ 60- sepsis post UTI  Maternal grandfather I.3 Deceased None Died @ 44 in sleep  Maternal grandmother I.4 Deceased None Syncope @ 24 while running and playing with grandchildren. Died after going into coma   Genetics [name] was counseled on the genetics of hypertrophic cardiomyopathy (HCM). I explained to the patient that this is an autosomal dominant condition with incomplete penetrance i.e. not all individuals  harboring the HCM mutation will present clinically with HCM, and age-related penetrance where clinical presentation of HCM increases with advanced age. Variability in clinical expression is also seen in families with HCM with affected family members presenting clinically at different ages and with symptoms ranging from mild to severe.  Since HCM is an autosomal dominant condition, first degree-relatives are at a 50% risk of inheriting this condition. They should seek regular surveillance for HCM.  First-degree relatives include her daughter and sister. At this time her grandchildren and nephews do not need to undergo screening- they can be screened if they are symptomatic or if their parent is found to have HCM.    Clinical screening of first-degree relatives involves echocardiogram and EKG at regular intervals, frequency is typically determined by age, with children undergoing screening every year until the age of 30 and those over the age of 47 getting screened every 3-5 years until the age of 75. Patient verbalized understanding of this.  Also briefly discussed the inheritance pattern and treatment /management plans for the infiltrative cardiomyopathies that present as HCM phenocopies. About 8-10% of HCM patients can have compound and digenic sarcomeric mutations for HCM  Patient should be aware that genetic testing is a probabilistic test dependent upon age and severity of presentation, presence of risk factors for HCM and importantly family history of HCM or sudden death in first-degree relatives. The potential outcomes of genetic testing and subsequent management of at-risk family members is listed below-  If a mutation is not identified then it is important that he understands that HCM is a genetic condition and can be passed down to his children. All first-degree relatives should undergo regular screening for HCM.  A negative test result can be due to limitations of the genetic test.   There is  also the likelihood of identifying a "Variant of unknown significance". This result means that the variant has not been detected in a statistically significant number of HCM patients and/or functional studies have not been performed to verify its pathogenicity. This VUS can be tested in the family to see if it segregates with disease. If a VUS is found, first-degree relatives should undergo regular clinical screening for HCM, but genetic testing for the VUS is otherwise not warranted.  If a pathogenic variant is reported, then first-degree family members can get tested for this variant. If they test positive, it is likely they will develop HCM. In light of variable expression and incomplete penetrance associated with HCM, it is not possible to predict when they will manifest clinically with HCM. It is recommended that family members that test positive for the familial pathogenic variant pursue clinical screening for HCM. Family members that test negative for the familial mutation need not pursue periodic screening for HCM, but seek care if symptoms develop.   Impression  [name] was found to have cardiac wall thickness suggestive of HCM at age 78 in the absence of other cardiac loading conditions that can lead to cardiac hypertrophy. There is no family history of HCM or sudden death. 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  Genetic testing is recommended to confirm her diagnosis. This test should include the major sarcomeric genes involved in HCM, namely MYBPPC3, MYH7, TNNI3, TNNT2, TPM1, ACTC1, MYL2 and MYL3. It should also include the genes involved in HCM phenocopies as cardiac-predominant forms of these conditions present clinically as HCM. These include genes for Fabry disease (GLA), Danon disease (LAMP2), WPW syndrome (PRKAG2), Familial transthyretin amyloidosis (TTR) and phospholamban (PLN).  In addition, patient  should be aware of protections afforded by the Genetic Information Non-Discrimination Act (GINA). GINA protects a patient from losing their employment or health insurance based on their genotype. However, these protections do not cover life insurance and disability. Explained to the patient that family members that are found to have the familial genetic mutation will be denied life insurance even if they are asymptomatic and do not exhibit clinical signs of HCM. She verbalized understanding and will discuss this with her daughter and sister.   Please note that the patient has not been counseled in this visit on other personal, cultural or ethical issues that she may face due to her heart condition.   Plan After a thorough discussion of the risk and benefits of genetic testing for HCM, [name] declines genetic testing due to insurance non-coverage for her HCM test. She will discuss HCM screening guidelines and procuring life insurance with her daughter and sister.      [name], Ph.D, [facility] Clinical Molecular Geneticist

## 2023-08-13 ENCOUNTER — Ambulatory Visit (HOSPITAL_COMMUNITY)
Admission: RE | Admit: 2023-08-13 | Discharge: 2023-08-13 | Disposition: A | Discharge status: Home / Self Care | Source: Ambulatory Visit | Attending: Internal Medicine | Admitting: Internal Medicine

## 2023-08-13 ENCOUNTER — Other Ambulatory Visit: Payer: Self-pay | Admitting: Internal Medicine

## 2023-08-13 DIAGNOSIS — I422 Other hypertrophic cardiomyopathy: Secondary | ICD-10-CM

## 2023-08-13 DIAGNOSIS — Z87891 Personal history of nicotine dependence: Secondary | ICD-10-CM

## 2023-08-13 DIAGNOSIS — Z8241 Family history of sudden cardiac death: Secondary | ICD-10-CM

## 2023-08-13 DIAGNOSIS — R002 Palpitations: Secondary | ICD-10-CM

## 2023-08-13 LAB — ECHOCARDIOGRAM COMPLETE: Area-P 1/2: 3.2 cm2

## 2023-08-13 NOTE — Progress Notes (Unsigned)
 Patient ID: [name], female   DOB: 1960/07/22, 63 y.o.   MRN: [id number]  Reason for Consult: No chief complaint on file.   Referred by Olinda Bertrand, DO  Subjective:     HPI [name] is a 63 y.o. female presenting for evaluation of carotid artery stenosis.  She was noted to have bilateral carotid artery bruits and a screening duplex was obtained which demonstrated a 60 to 79% stenosis on the right proximal ICA.  She denies any previous strokes or strokelike symptoms.  Specifically she denies any one-sided weakness, numbness, amaurosis or speech issues.  She is a former smoker and quit in May 2024.  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   Hyperlipidemia    Hypertension    Seasonal allergies    Sickle cell trait (HCC)    Stroke (HCC) 2013   Family History  Problem Relation Age of Onset   Hyperlipidemia Mother    Hypertension Mother    Diabetes Mother    Renal Disease Mother    Congestive Heart Failure Mother    Hypertension Maternal Grandfather    Diabetes Maternal Grandfather    Heart disease Paternal Grandmother        died from mycardial infarcation   Colon polyps Daughter 88   Colon cancer Neg Hx    Esophageal cancer Neg Hx    Stomach cancer Neg Hx    Rectal cancer Neg Hx    Past Surgical History:  Procedure Laterality Date   ABDOMINAL HYSTERECTOMY     ESOPHAGOGASTRODUODENOSCOPY (EGD) WITH PROPOFOL  N/A 04/29/2022   Procedure: ESOPHAGOGASTRODUODENOSCOPY (EGD) WITH PROPOFOL ;  Surgeon: Ace Holder, MD;  Location: [facility] ENDOSCOPY;  Service: Gastroenterology;  Laterality: N/A;   HEMOSTASIS CLIP PLACEMENT  04/29/2022   Procedure: HEMOSTASIS CLIP PLACEMENT;  Surgeon: Ace Holder, MD;  Location: [facility] ENDOSCOPY;  Service: Gastroenterology;;   HOT HEMOSTASIS N/A 04/29/2022    Procedure: HOT HEMOSTASIS (ARGON PLASMA COAGULATION/BICAP);  Surgeon: Ace Holder, MD;  Location: [facility] ENDOSCOPY;  Service: Gastroenterology;  Laterality: N/A;    Short Social History:  Social History   Tobacco Use   Smoking status: Former    Current packs/day: 0.00    Average packs/day: 0.5 packs/day for 42.3 years (21.2 ttl pk-yrs)    Types: Cigarettes    Start date: 02/25/1980    Quit date: 07/01/2022    Years since quitting: 1.1    Passive exposure: Past   Smokeless tobacco: Never   Tobacco comments:    Quit in 2010 for 3 months - Quit 06/2022  Substance Use Topics   Alcohol use: Not Currently    Comment: OCCASIONAL    Allergies  Allergen Reactions   Asa [Aspirin ] Nausea Only, Palpitations and Other (See Comments)    GI upset Tremors Reactions to 325mg  dose   Crestor  [Rosuvastatin ] Other (See Comments)    Myalgias    Flagyl [Metronidazole] Other (See Comments)    Yeast infections   Gluten Meal Nausea And Vomiting and Other (See Comments)    Bloating Flatulence   Milk-Related Compounds Nausea And Vomiting and Other (See Comments)    Bloating Gas   Chantix  [Varenicline ] Other (See Comments)    Dizziness   Egg-Derived Products Nausea Only   Fish Allergy Hives, Itching and Rash    Specifically tuna   Zocor [Simvastatin] Rash  Current Outpatient Medications  Medication Sig Dispense Refill   carvedilol  (COREG ) 3.125 MG tablet Take 1 tablet (3.125 mg total) by mouth 2 (two) times daily. 180 tablet 3   cetirizine  (ZYRTEC  ALLERGY) 10 MG tablet Take 1 tablet (10 mg total) by mouth daily. 30 tablet 0   Cyanocobalamin (VITAMIN B-12 PO) Take 1 tablet by mouth 3 (three) times daily.     Evolocumab  (REPATHA  SURECLICK) 140 MG/ML SOAJ Inject 140 mg into the skin every 14 (fourteen) days. 2 mL 5   ferrous sulfate  324 (65 Fe) MG TBEC Take 1 tablet (324 mg total) by mouth every other day. 30 tablet 3   losartan  (COZAAR ) 50 MG tablet Take 1 tablet (50 mg total) by mouth in  the morning. 90 tablet 3   meclizine  (ANTIVERT ) 50 MG tablet Take 1 tablet (50 mg total) by mouth 3 (three) times daily as needed. 30 tablet 0   polyethylene glycol (MIRALAX  / GLYCOLAX ) 17 g packet Take 17 g by mouth daily as needed. 14 each 0   Probiotic Product (PROBIOTIC PO) Take 1 capsule by mouth at bedtime.     Tiotropium Bromide Monohydrate  (SPIRIVA  RESPIMAT) 2.5 MCG/ACT AERS Inhale 2 puffs into the lungs daily. 1 each 6   No current facility-administered medications for this visit.    REVIEW OF SYSTEMS  All other systems were reviewed and are negative     Objective:  Objective   There were no vitals filed for this visit. There is no height or weight on file to calculate BMI.  Physical Exam General: no acute distress Cardiac: hemodynamically stable Pulm: normal work of breathing Abdomen: non-tender, no pulsatile mass*** Neuro: alert, no focal deficit Extremities: no edema, cyanosis or wounds Vascular:   Right: Palpable radial  Left: Palpable radial  Data: Carotid duplex Right Carotid Findings:  +----------+--------+--------+--------+---------------------+--------------  ----+           PSV cm/sEDV cm/sStenosisPlaque Description   Comments             +----------+--------+--------+--------+---------------------+--------------  ----+  CCA Prox  81      15                                   intimal  thickening  +----------+--------+--------+--------+---------------------+--------------  ----+  CCA Distal72      22                                                        +----------+--------+--------+--------+---------------------+--------------  ----+  ICA Prox  207     71      60-79%  smooth and                                                                  homogeneous                               +----------+--------+--------+--------+---------------------+--------------  ----+  ICA Mid   163     39                                                         +----------+--------+--------+--------+---------------------+--------------  ----+  ICA [id number]     39                                                        +----------+--------+--------+--------+---------------------+--------------  ----+  ECA      88      9               heterogenous                              +----------+--------+--------+--------+---------------------+--------------  ----+   +----------+--------+-------+---------+-------------------+           PSV cm/sEDV cmsDescribe Arm Pressure (mmHG)  +----------+--------+-------+---------+-------------------+  [id number]            Turbulent154                  +----------+--------+-------+---------+-------------------+   +---------+--------+---+--------+--+---------+  VertebralPSV cm/s135EDV cm/s39Antegrade  +---------+--------+---+--------+--+---------+      Left Carotid Findings:  +---------+--------+-------+--------+------------------------+-------------  ----+          PSV cm/sEDV    StenosisPlaque Description      Comments                             cm/s                                                       +---------+--------+-------+--------+------------------------+-------------  ----+  CCA Prox 72      16                                                         +---------+--------+-------+--------+------------------------+-------------  ----+  CCA     91      26                                     intimal             Distal                                                  thickening          +---------+--------+-------+--------+------------------------+-------------  ----+  ICA Prox 92      24     1-39%   heterogenous and                                                            irregular                                    +---------+--------+-------+--------+------------------------+-------------  ----+  ICA Mid  128     36                                                         +---------+--------+-------+--------+------------------------+-------------  ----+  ICA     90      29                                                         Distal                                                                      +---------+--------+-------+--------+------------------------+-------------  ----+  ECA     171     20             heterogenous and                                                            irregular                                   +---------+--------+-------+--------+------------------------+-------------  ----+   +----------+--------+--------+--------+-------------------+           PSV cm/sEDV cm/sDescribeArm Pressure (mmHG)  +----------+--------+--------+--------+-------------------+  [id number]            Stenotic142                  +----------+--------+--------+--------+-------------------+   +---------+--------+--+--------+--+---------+  VertebralPSV cm/s80EDV cm/s22Antegrade  +---------+--------+--+--------+--+---------+   Echo reviewed  BMP reviewed, Cr 0.75  Stress test reviewed     Assessment/Plan:   Yamel Bale is a 63 y.o. female with asymptomatic carotid artery disease.  Her right 60 to 79%, left 1 to 39%.  We discussed the natural history as well as risk factors of carotid artery stenosis.  I also explained the threshold to consider intervention being 80% in asymptomatic patients or 50% in patients who have had ipsilateral neurologic symptoms.  Since she is asymptomatic we will plan to continue with best medical management and surveillance.    She is on Repatha  but has a reaction to aspirin . Plan for follow-up in 12 months with a repeat duplex  Recommendations to optimize cardiovascular risk: Abstinence from  all tobacco products. Blood glucose control with goal A1c < 7%. Blood pressure control with goal blood pressure < 140/90 mmHg. Lipid reduction therapy with goal LDL-C <100 mg/dL  Aspirin  81mg  PO QD.  Atorvastatin  40-80mg  PO QD (or other high intensity statin therapy).   Philipp Brawn MD [facility]

## 2023-08-14 ENCOUNTER — Encounter: Payer: Self-pay | Admitting: Vascular Surgery

## 2023-08-14 ENCOUNTER — Telehealth: Payer: Self-pay | Admitting: Internal Medicine

## 2023-08-14 ENCOUNTER — Ambulatory Visit: Attending: Vascular Surgery | Admitting: Vascular Surgery

## 2023-08-14 VITALS — BP 140/80 | HR 70 | Temp 97.7°F | Resp 20 | Ht 64.0 in | Wt 179.0 lb

## 2023-08-14 DIAGNOSIS — I6523 Occlusion and stenosis of bilateral carotid arteries: Secondary | ICD-10-CM | POA: Diagnosis not present

## 2023-08-14 MED ORDER — CARVEDILOL 6.25 MG PO TABS: 6.2500 mg | ORAL_TABLET | Freq: Two times a day (BID) | ORAL | 3 refills | Status: AC

## 2023-08-14 NOTE — Telephone Encounter (Signed)
 Placed order for carvedilol  6.25 mg PO BID per MD.      Signed     Called patient to discussed results: Severe inducible LVOT gradient.  Patient notes she is NYHA I: she has no sx during squat to stand.  Able to walk around [employer], where she works, with no issues.  Atrributes BP elevation to stress.   Still working on smoking cessation.   We will increase her coreg  to 6.25 mg PO BID. If she develops cardiac sx we will switch to equivalent metoprolol and bring her back to discuss.   No barriers to her going to [place].  Gloriann Larger, MD FASE [facility] Cardiologist [facility]  [address] [place], [place] [address] [phone]  12:32 PM

## 2023-08-14 NOTE — Addendum Note (Signed)
 Addended by: Christine Cozier on: 08/14/2023 01:19 PM   Modules accepted: Orders

## 2023-08-14 NOTE — Telephone Encounter (Signed)
 Called patient to discussed results: Severe inducible LVOT gradient.  Patient notes she is NYHA I: she has no sx during squat to stand.  Able to walk around [employer], where she works, with no issues.  Atrributes BP elevation to stress.  Still working on smoking cessation.  We will increase her coreg  to 6.25 mg PO BID. If she develops cardiac sx we will switch to equivalent metoprolol and bring her back to discuss.   No barriers to her going to [place].  Gloriann Larger, MD FASE [facility] Cardiologist [facility]  [address] [place], [place] [address] [phone]  12:32 PM

## 2023-08-16 ENCOUNTER — Ambulatory Visit: Payer: Self-pay | Admitting: Internal Medicine

## 2023-08-16 DIAGNOSIS — Z8241 Family history of sudden cardiac death: Secondary | ICD-10-CM

## 2023-08-16 DIAGNOSIS — R002 Palpitations: Secondary | ICD-10-CM | POA: Diagnosis not present

## 2023-08-16 DIAGNOSIS — I422 Other hypertrophic cardiomyopathy: Secondary | ICD-10-CM | POA: Diagnosis not present

## 2023-09-14 ENCOUNTER — Encounter: Payer: Self-pay | Admitting: Cardiology

## 2023-09-14 ENCOUNTER — Ambulatory Visit: Attending: Cardiology | Admitting: Cardiology

## 2023-09-14 VITALS — BP 156/86 | HR 60 | Resp 16 | Ht 64.0 in | Wt 179.0 lb

## 2023-09-14 DIAGNOSIS — F17210 Nicotine dependence, cigarettes, uncomplicated: Secondary | ICD-10-CM

## 2023-09-14 DIAGNOSIS — I10 Essential (primary) hypertension: Secondary | ICD-10-CM

## 2023-09-14 DIAGNOSIS — E7801 Familial hypercholesterolemia: Secondary | ICD-10-CM | POA: Diagnosis not present

## 2023-09-14 DIAGNOSIS — Z789 Other specified health status: Secondary | ICD-10-CM | POA: Diagnosis not present

## 2023-09-14 DIAGNOSIS — I422 Other hypertrophic cardiomyopathy: Secondary | ICD-10-CM | POA: Diagnosis not present

## 2023-09-14 DIAGNOSIS — I6521 Occlusion and stenosis of right carotid artery: Secondary | ICD-10-CM

## 2023-09-14 NOTE — Progress Notes (Signed)
 Cardiology Office Note:  .   Date:  09/14/2023  ID:  [name], DOB Jun 19, 1960, MRN [id number] PCP:  Tanda Bleacher, MD  Former Cardiology Providers: NA [facility] Providers Cardiologist:  Madonna Large, DO , [facility] (established care 05/30/2020) Electrophysiologist:  None  Click to update primary MD,subspecialty MD or APP then REFRESH:1}    Chief Complaint  Patient presents with   Cardiomyopathy   Follow-up    History of Present Illness: .   [name] is a 63 y.o. African-American female whose past medical history and cardiovascular risk factors includes:  HCM, Hx of GI bleeding, COVID-19 infection (01/2020), smoking cigarettes, hypertension, familial hypercholesterolemia, subarachnoid hemorrhage in 2012, COPD, postmenopausal female.   Patient was referred to the practice back in 2022 for evaluation of left ventricular hypertrophy.  She had an echocardiogram back in March 2022 which noted preserved LVEF, severe concentric hypertrophy, and peak LV gradient was 234 mmHg (but location w/n the LV nonspecific).  She denied any history of syncope or family history of sudden cardiac death or cardiomyopathy.  Her shortness of breath which was more noticeable with effort related activities is being attributed to her post COVID infection, per patient.  Patient was recommended to undergo cardiac MRI.  I had discontinued chlorthalidone  and started her on diltiazem  120 mg p.o. daily as well as losartan .  Patient had a cardiac MRI in January 2025 which notes severe basal septal hypertrophy with SAM consistent with hypertrophic cardiomyopathy.  She was referred to HCM clinic with Dr. Arnetha who saw the patient back in May 2025.  She was recommended to undergo physiological stress test which noted a peak LVOT gradient of 9.8 mmHg at rest and 33.6 mmHg with Valsalva.  Dr. Arnetha to increase your carvedilol  to 6.25 mg p.o. twice daily; however, patient states that she still on  3.125 mg p.o. twice daily office blood pressures are not well-controlled at home blood pressures are usually around 140/70 mmHg with the wrist cuff that she uses.  Patient has started Repatha  since last office visit and has not had any repeat fasting lipids.  She was also seen by vascular surgery given right ICA disease <80%.  She plans follow-up with them with regards to carotid disease management going forward I will also have a screening ultrasound for AAA.  Clinically denies anginal chest pain or heart failure symptoms.  Review of Systems: .   Review of Systems  Cardiovascular:  Positive for dyspnea on exertion and palpitations (intermittent). Negative for chest pain, claudication, irregular heartbeat, leg swelling, near-syncope, orthopnea, paroxysmal nocturnal dyspnea and syncope.  Respiratory:  Negative for shortness of breath.   Hematologic/Lymphatic: Negative for bleeding problem.  Gastrointestinal:  Positive for hematochezia (History of) and melena (History of).    Studies Reviewed:    Echocardiogram: 04/2020:Peak LV gradient 234 mmHg with CW doppler, but location in LV is nonspecific.SABRA  LVEF 60 to 65%, severe LVH, moderate pericardial effusion, mild to moderate MR.    12/11/2022: Apical peak gradient 13.6 mmHg.  LVEF 60 to 65%, severe septal asymmetric LVH, grade 1 diastolic dysfunction, right ventricular size and function normal, moderate pericardial effusion.  07/24/2023 1. LVOT gradient at rest 9.67mmHG and with Valsalva 33.6 mmHG. . Left ventricular ejection fraction, by estimation, is >75%. The left ventricle has hyperdynamic function. The left ventricle has no regional wall motion abnormalities. There is severe left  ventricular hypertrophy. Left ventricular diastolic parameters were normal. The average left ventricular global longitudinal strain is -11.3 %. The global  longitudinal strain is abnormal. 2. Right ventricular systolic function is normal. The right ventricular  size is normal. There is normal pulmonary artery systolic pressure. 3. A small pericardial effusion is present. The pericardial effusion is anterior to the right ventricle. There is no evidence of cardiac tamponade. 4. The mitral valve is normal in structure. Mild mitral valve regurgitation. No evidence of mitral stenosis. 5. The aortic valve was not well visualized. Unable to determine aortic valve morphology due to image quality. Aortic valve regurgitation is not visualized. No aortic stenosis is present. 6. The inferior vena cava is normal in size with greater than 50% respiratory variability, suggesting right atrial pressure of 3 mmHg.    CMRI 02/2023 1. Severe basal septal hypertrophy with SAM Septal thickness 19 mm compared to posterior wall 9 mm. Findings more consistent with hypertrophic cardiomyopathy. Suggest echo correlation for gradients. No high risk features in regard to hyper-enhancement   2.  No evidence of amyloid.  Normal T1/T2 and minimally elevated ECV   3.  Normal LVEF 59%   4.  Normal RVEF 55%   5.  Mild LAE   6.  Mild MR   7.  Trivial posterior pericardial effusion.   8.  Estimated cardiac output 2.5 L/min  RADIOLOGY: NA  Risk Assessment/Calculations:   NA   Labs:       Latest Ref Rng & Units 06/10/2023   10:03 AM 01/05/2023   11:24 AM 08/11/2022    2:35 PM  CBC  WBC 4.0 - 10.5 K/uL 6.6  7.2  8.1   Hemoglobin 12.0 - 15.0 g/dL 86.1  86.0  88.3   Hematocrit 36.0 - 46.0 % 42.4  43.7  36.4   Platelets 150 - 400 K/uL 319  295  362.0        Latest Ref Rng & Units 06/10/2023    5:11 PM 05/18/2023    4:45 PM 05/01/2022    5:08 PM  BMP  Glucose 70 - 99 mg/dL 91  87  93   BUN 8 - 23 mg/dL 13  10  9    Creatinine 0.44 - 1.00 mg/dL 9.24  9.32  9.10   BUN/Creat Ratio 12 - 28  15  10    Sodium 135 - 145 mmol/L 138  143  143   Potassium 3.5 - 5.1 mmol/L 4.0  4.1  4.1   Chloride 98 - 111 mmol/L 101  105  105   CO2 22 - 32 mmol/L 29  25  23    Calcium  8.9 -  10.3 mg/dL 89.8  9.3  9.4       Latest Ref Rng & Units 06/10/2023    5:11 PM 05/18/2023    4:45 PM 05/01/2022    5:08 PM  CMP  Glucose 70 - 99 mg/dL 91  87  93   BUN 8 - 23 mg/dL 13  10  9    Creatinine 0.44 - 1.00 mg/dL 9.24  9.32  9.10   Sodium 135 - 145 mmol/L 138  143  143   Potassium 3.5 - 5.1 mmol/L 4.0  4.1  4.1   Chloride 98 - 111 mmol/L 101  105  105   CO2 22 - 32 mmol/L 29  25  23    Calcium  8.9 - 10.3 mg/dL 89.8  9.3  9.4     Lab Results  Component Value Date   CHOL 312 (H) 03/24/2023   HDL 37 (L) 03/24/2023   LDLCALC 251 (H) 03/24/2023   TRIG 130  03/24/2023   CHOLHDL 8.4 (H) 03/24/2023   No results for input(s): LIPOA in the last 8760 hours. No components found for: NTPROBNP No results for input(s): PROBNP in the last 8760 hours. No results for input(s): TSH in the last 8760 hours.   Physical Exam:    Today's Vitals   09/14/23 1614  BP: (!) 156/86  Pulse: 60  Resp: 16  SpO2: 96%  Weight: 179 lb (81.2 kg)  Height: 5' 4 (1.626 m)   Body mass index is 30.73 kg/m. Wt Readings from Last 3 Encounters:  09/14/23 179 lb (81.2 kg)  08/14/23 179 lb (81.2 kg)  07/14/23 175 lb (79.4 kg)    Physical Exam  Constitutional: No distress.  hemodynamically stable  HENT:  Xanthelasmas  Neck: No JVD present.  Cardiovascular: Normal rate, regular rhythm, S1 normal and S2 normal. Exam reveals no gallop, no S3 and no S4.  Murmur heard. Systolic murmur is present with a grade of 3/6. Pulses:      Carotid pulses are  on the right side with bruit and  on the left side with bruit. Pulmonary/Chest: Effort normal and breath sounds normal. No stridor. She has no wheezes. She has no rales.  Musculoskeletal:        General: No edema.     Cervical back: Neck supple.  Skin: Skin is warm.   Impression & Recommendation(s):  Impression:   ICD-10-CM   1. Hypertrophic cardiomyopathy (HCC)  I42.2     2. Familial hypercholesterolemia  E78.01 Lipid panel    Comp Met  (CMET)    Comp Met (CMET)    Lipid panel    3. Statin intolerance  Z78.9     4. Benign hypertension  I10     5. Stenosis of right carotid artery  I65.21     6. Cigarette smoker  F17.210        Recommendation(s):  Hypertrophic cardiomyopathy (HCC) NYHA class II June 2025: Squat to stand [facility] protocol provocation study performed to elicit LVOT gradient.  Consistent with severe LVOT obstruction in the setting of HCM. May 2025: Cardiac monitor did not illustrate any malignant arrhythmias Cardiac MRI January 2025: Severe basal septal hypertrophy consistent with HCM and no high risk features in regards to hyperenhancement. Continue losartan  50 mg p.o. daily Currently takes carvedilol  3.125 mg p.o. twice daily, Dr. Arnetha recommended increasing it to 6.25 mg p.o. twice daily this is still pending.  Will make the appropriate changes given her blood pressures.  If she has any symptoms we will have to transition her to metoprolol. She was on diltiazem  in the past but preferred not to be on it. Did establish care with Dr. Arnetha at the [facility] clinic. Also saw Dr. Genell Pac clinical geneticist and patient refused genetic screening due to lack of insurance coverage.  Patient is concerned with regards to life insurance coverage.  If she reconsiders that she will let us  know Patient is aware to seek medical attention if she has worsening shortness of breath, near-syncope or syncopal events  Familial hypercholesterolemia Statin intolerance LDL levels 251 mg/dL as of January 7974. Referred to Pharm.D. for initiation of PCSK9 inhibitors versus Leqvio. The Leqvio was denied. Currently on Repatha . No lipids have been checked after initiation of Repatha , fasting lipids and CMP ordered  Benign hypertension Office blood pressures are improving but not at goal. Shared decision was to implement her prior recommendations of increasing carvedilol  to 6.25 mg p.o. twice daily Continue losartan   50 mg p.o.  every morning Recommended checking her blood pressures regularly Reemphasized importance of low-salt diet.  Stenosis of right carotid artery Did follow-up with vascular surgery who will follow her clinically with carotid duplex going forward.  Progress note reviewed.  Discussed management of at least 2 chronic comorbid conditions. Reviewed the results of the squat to stand [facility] protocol provocation study June 2025, cardiac monitor May 2025. Reviewed the progress notes from Dr. Arnetha and Dr. Fairy as outlined above Prescription drug management Follow-up labs ordered Reemphasized importance of secondary prevention  Orders Placed:  Orders Placed This Encounter  Procedures   Lipid panel    Standing Status:   Future    Number of Occurrences:   1    Expected Date:   09/15/2023    Expiration Date:   09/13/2024   Comp Met (CMET)    Standing Status:   Future    Number of Occurrences:   1    Expected Date:   09/15/2023    Expiration Date:   09/13/2024   Final Medication List:    No orders of the defined types were placed in this encounter.   There are no discontinued medications.   Current Outpatient Medications:    carvedilol  (COREG ) 6.25 MG tablet, Take 1 tablet (6.25 mg total) by mouth 2 (two) times daily., Disp: 180 tablet, Rfl: 3   cetirizine  (ZYRTEC  ALLERGY) 10 MG tablet, Take 1 tablet (10 mg total) by mouth daily., Disp: 30 tablet, Rfl: 0   Cyanocobalamin (VITAMIN B-12 PO), Take 1 tablet by mouth 3 (three) times daily., Disp: , Rfl:    Evolocumab  (REPATHA  SURECLICK) 140 MG/ML SOAJ, Inject 140 mg into the skin every 14 (fourteen) days., Disp: 2 mL, Rfl: 5   ferrous sulfate  324 (65 Fe) MG TBEC, Take 1 tablet (324 mg total) by mouth every other day., Disp: 30 tablet, Rfl: 3   losartan  (COZAAR ) 50 MG tablet, Take 1 tablet (50 mg total) by mouth in the morning., Disp: 90 tablet, Rfl: 3   meclizine  (ANTIVERT ) 50 MG tablet, Take 1 tablet (50 mg total) by mouth 3 (three)  times daily as needed., Disp: 30 tablet, Rfl: 0   polyethylene glycol (MIRALAX  / GLYCOLAX ) 17 g packet, Take 17 g by mouth daily as needed., Disp: 14 each, Rfl: 0   Probiotic Product (PROBIOTIC PO), Take 1 capsule by mouth at bedtime., Disp: , Rfl:    Tiotropium Bromide Monohydrate  (SPIRIVA  RESPIMAT) 2.5 MCG/ACT AERS, Inhale 2 puffs into the lungs daily., Disp: 1 each, Rfl: 6  Consent:   NA  Disposition:   3 months follow-up sooner if needed  Her questions and concerns were addressed to her satisfaction. She voices understanding of the recommendations provided during this encounter.    Signed, Madonna Large, DO, [facility] [place]  [facility]  [address] [place], [place] [address] 09/14/2023 5:27 PM

## 2023-09-14 NOTE — Patient Instructions (Addendum)
 Medication Instructions:  START Carvedilol  to 6.25 mg twice a day   *If you need a refill on your cardiac medications before your next appointment, please call your pharmacy*  Lab Work: FASTING lipid panel and CMP tomorrow   If you have labs (blood work) drawn today and your tests are completely normal, you will receive your results only by: MyChart Message (if you have MyChart) OR A paper copy in the mail If you have any lab test that is abnormal or we need to change your treatment, we will call you to review the results.  Follow-Up: At [facility], you and your health needs are our priority.  As part of our continuing mission to provide you with exceptional heart care, our providers are all part of one team.  This team includes your primary Cardiologist (physician) and Advanced Practice Providers or APPs (Physician Assistants and Nurse Practitioners) who all work together to provide you with the care you need, when you need it.  Your next appointment:   6 month(s)  Provider:   Madonna Large, DO    We recommend signing up for the patient portal called MyChart.  Sign up information is provided on this After Visit Summary.  MyChart is used to connect with patients for Virtual Visits (Telemedicine).  Patients are able to view lab/test results, encounter notes, upcoming appointments, etc.  Non-urgent messages can be sent to your provider as well.   To learn more about what you can do with MyChart, go to [email or web address].

## 2023-09-16 ENCOUNTER — Telehealth: Admitting: Family Medicine

## 2023-09-16 ENCOUNTER — Ambulatory Visit: Payer: Self-pay | Admitting: *Deleted

## 2023-09-16 DIAGNOSIS — R3989 Other symptoms and signs involving the genitourinary system: Secondary | ICD-10-CM | POA: Diagnosis not present

## 2023-09-16 MED ORDER — NITROFURANTOIN MONOHYD MACRO 100 MG PO CAPS: 100.0000 mg | ORAL_CAPSULE | Freq: Two times a day (BID) | ORAL | 0 refills | Status: AC

## 2023-09-16 NOTE — Patient Instructions (Addendum)
 [name], thank you for joining [facility] Brittany Darby, FNP for today's virtual visit.  While this provider is not your primary care provider (PCP), if your PCP is located in our provider database this encounter information will be shared with them immediately following your visit.   A [facility] MyChart account gives you access to today's visit and all your visits, tests, and labs performed at [facility]  click here if you don't have a [facility] MyChart account or go to [email or web address]  Consent: (Patient) [name] provided verbal consent for this virtual visit at the beginning of the encounter.  Current Medications:  Current Outpatient Medications:    nitrofurantoin , macrocrystal-monohydrate, (MACROBID ) 100 MG capsule, Take 1 capsule (100 mg total) by mouth 2 (two) times daily for 5 days., Disp: 10 capsule, Rfl: 0   carvedilol  (COREG ) 6.25 MG tablet, Take 1 tablet (6.25 mg total) by mouth 2 (two) times daily., Disp: 180 tablet, Rfl: 3   cetirizine  (ZYRTEC  ALLERGY) 10 MG tablet, Take 1 tablet (10 mg total) by mouth daily., Disp: 30 tablet, Rfl: 0   Cyanocobalamin (VITAMIN B-12 PO), Take 1 tablet by mouth 3 (three) times daily., Disp: , Rfl:    Evolocumab  (REPATHA  SURECLICK) 140 MG/ML SOAJ, Inject 140 mg into the skin every 14 (fourteen) days., Disp: 2 mL, Rfl: 5   ferrous sulfate  324 (65 Fe) MG TBEC, Take 1 tablet (324 mg total) by mouth every other day., Disp: 30 tablet, Rfl: 3   losartan  (COZAAR ) 50 MG tablet, Take 1 tablet (50 mg total) by mouth in the morning., Disp: 90 tablet, Rfl: 3   meclizine  (ANTIVERT ) 50 MG tablet, Take 1 tablet (50 mg total) by mouth 3 (three) times daily as needed., Disp: 30 tablet, Rfl: 0   polyethylene glycol (MIRALAX  / GLYCOLAX ) 17 g packet, Take 17 g by mouth daily as needed., Disp: 14 each, Rfl: 0   Probiotic Product (PROBIOTIC PO), Take 1 capsule by mouth at bedtime., Disp: , Rfl:    Tiotropium Bromide Monohydrate  (SPIRIVA   RESPIMAT) 2.5 MCG/ACT AERS, Inhale 2 puffs into the lungs daily., Disp: 1 each, Rfl: 6   Medications ordered in this encounter:  Meds ordered this encounter  Medications   nitrofurantoin , macrocrystal-monohydrate, (MACROBID ) 100 MG capsule    Sig: Take 1 capsule (100 mg total) by mouth 2 (two) times daily for 5 days.    Dispense:  10 capsule    Refill:  0    Supervising Provider:   BLAISE ALEENE KIDD [[id number]]     *If you need refills on other medications prior to your next appointment, please contact your pharmacy*  Follow-Up: Call back or seek an in-person evaluation if the symptoms worsen or if the condition fails to improve as anticipated.  [facility] [phone]  Other Instructions Start antibiotic today. Finish entire course of antibiotics even if you are feeling better If symptoms are worsening (fever/ chills, or back pain) please be seen at urgent care or the emergency room right away. Symptoms should gradually improve over the next few days. Drink plenty of water to stay well hydrated. Please review the urinary tract infection handout attached in AVS.    If you have been instructed to have an in-person evaluation today at a local Urgent Care facility, please use the link below. It will take you to a list of all of our available  Urgent Cares, including address, phone number and hours of operation. Please do not delay care.  [facility]  If you or a family member do not have a primary care provider, use the link below to schedule a visit and establish care. When you choose a [facility] primary care physician or advanced practice provider, you gain a long-term partner in health. Find a Primary Care Provider  Learn more about [facility]'s in-office and virtual care options: [facility] - Get Care Now

## 2023-09-16 NOTE — Telephone Encounter (Signed)
 noted

## 2023-09-16 NOTE — Telephone Encounter (Signed)
 Copied from CRM [id number]. Topic: Clinical - Red Word Triage >> Sep 16, 2023 11:07 AM Donna BRAVO wrote: Red Word that prompted transfer to Nurse Triage: patient has pressure, burning,pain when urination Reason for Disposition  Age > 50 years  Answer Assessment - Initial Assessment Questions 1. SEVERITY: How bad is the pain?  (e.g., Scale 1-10; mild, moderate, or severe)     I'm having burning and pressure with urination. 2. FREQUENCY: How many times have you had painful urination today?      Yes  Every time 3. PATTERN: Is pain present every time you urinate or just sometimes?      Yes 4. ONSET: When did the painful urination start?      Started 5 days ago   It's gotten worse over the last 3 days. 5. FEVER: Do you have a fever? If Yes, ask: What is your temperature, how was it measured, and when did it start?     No 6. PAST UTI: Have you had a urine infection before? If Yes, ask: When was the last time? and What happened that time?      This is the 2nd one this year.   I don't get these. 7. CAUSE: What do you think is causing the painful urination?  (e.g., UTI, scratch, Herpes sore)     UTI 8. OTHER SYMPTOMS: Do you have any other symptoms? (e.g., blood in urine, flank pain, genital sores, urgency, vaginal discharge)     No flank pain, my urine is clear.    9. PREGNANCY: Is there any chance you are pregnant? When was your last menstrual period?     N/A due to age  Protocols used: Urination Pain - Female-A-AH  Pt scheduled an On Demand video visit for today at 11:45 AM.       FYI Only or Action Required?: FYI only for provider.  Patient was last seen in primary care on 07/02/2023 by Tanda Bleacher, MD.  Called Nurse Triage reporting Dysuria.  Symptoms began a week ago.  Interventions attempted: Rest, hydration, or home remedies.  Symptoms are: rapidly worsening.  Triage Disposition: See Physician Within 24 Hours  Patient/caregiver understands and  will follow disposition?: Yes

## 2023-09-16 NOTE — Progress Notes (Signed)
 Virtual Visit Consent   [name], you are scheduled for a virtual visit with a [facility] provider today. Just as with appointments in the office, your consent must be obtained to participate. Your consent will be active for this visit and any virtual visit you may have with one of our providers in the next 365 days. If you have a MyChart account, a copy of this consent can be sent to you electronically.  As this is a virtual visit, video technology does not allow for your provider to perform a traditional examination. This may limit your provider's ability to fully assess your condition. If your provider identifies any concerns that need to be evaluated in person or the need to arrange testing (such as labs, EKG, etc.), we will make arrangements to do so. Although advances in technology are sophisticated, we cannot ensure that it will always work on either your end or our end. If the connection with a video visit is poor, the visit may have to be switched to a telephone visit. With either a video or telephone visit, we are not always able to ensure that we have a secure connection.  By engaging in this virtual visit, you consent to the provision of healthcare and authorize for your insurance to be billed (if applicable) for the services provided during this visit. Depending on your insurance coverage, you may receive a charge related to this service.  I need to obtain your verbal consent now. Are you willing to proceed with your visit today? Alura Olveda has provided verbal consent on 09/16/2023 for a virtual visit (video or telephone). Olam [name], FNP  Date: 09/16/2023 11:59 AM   Virtual Visit via Video Note   I, Olam [name], connected with  [name]  ([id number], Feb 13, 1961) on 09/16/23 at 11:45 AM EDT by a video-enabled telemedicine application and verified that I am speaking with the correct person using two identifiers.  Location: Patient: Virtual Visit Location Patient:  Home Provider: Virtual Visit Location Provider: Home Office   I discussed the limitations of evaluation and management by telemedicine and the availability of in person appointments. The patient expressed understanding and agreed to proceed.    History of Present Illness: [name] is a 63 y.o.  female, and is being seen today for symptoms that started on Friday and worsening over lat 3 days. Started with burning sensation. Pain with urination, going frequently and smaller quantities. Dreads going due to the discomfort. Going 2-3 times hourly during the day. Waking up overnight to go, last night x 4 times. No fevers or chills. Mild pressure in bottom of her stomach. No back pain. Last UTI was in December of 2024, she did let her symptoms go greater than 6 days and developed back pain. She was treated with Cipro  and rocephin  and was ecoli positive in urine culture.  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    Allergies:  Allergies  Allergen Reactions   Dorethia Moors ] Nausea Only, Palpitations and Other (See Comments)  GI upset Tremors Reactions to 325mg  dose   Crestor  [Rosuvastatin ] Other (See Comments)    Myalgias    Flagyl [Metronidazole] Other (See Comments)    Yeast infections   Gluten Meal Nausea And Vomiting and Other (See Comments)    Bloating Flatulence   Milk-Related Compounds Nausea And Vomiting and Other (See Comments)    Bloating Gas   Chantix  [Varenicline ] Other (See  Comments)    Dizziness   Egg-Derived Products Nausea Only   Fish Allergy Hives, Itching and Rash    Specifically tuna   Zocor [Simvastatin] Rash   Medications:  Current Outpatient Medications:    nitrofurantoin , macrocrystal-monohydrate, (MACROBID ) 100 MG capsule, Take 1 capsule (100 mg total) by mouth 2 (two) times daily for 5 days., Disp: 10 capsule, Rfl: 0   carvedilol  (COREG ) 6.25 MG tablet, Take 1 tablet (6.25 mg total) by mouth 2 (two) times daily., Disp: 180 tablet, Rfl: 3   cetirizine  (ZYRTEC  ALLERGY) 10 MG tablet, Take 1 tablet (10 mg total) by mouth daily., Disp: 30 tablet, Rfl: 0   Cyanocobalamin (VITAMIN B-12 PO), Take 1 tablet by mouth 3 (three) times daily., Disp: , Rfl:    Evolocumab  (REPATHA  SURECLICK) 140 MG/ML SOAJ, Inject 140 mg into the skin every 14 (fourteen) days., Disp: 2 mL, Rfl: 5   ferrous sulfate  324 (65 Fe) MG TBEC, Take 1 tablet (324 mg total) by mouth every other day., Disp: 30 tablet, Rfl: 3   losartan  (COZAAR ) 50 MG tablet, Take 1 tablet (50 mg total) by mouth in the morning., Disp: 90 tablet, Rfl: 3   meclizine  (ANTIVERT ) 50 MG tablet, Take 1 tablet (50 mg total) by mouth 3 (three) times daily as needed., Disp: 30 tablet, Rfl: 0   polyethylene glycol (MIRALAX  / GLYCOLAX ) 17 g packet, Take 17 g by mouth daily as needed., Disp: 14 each, Rfl: 0   Probiotic Product (PROBIOTIC PO), Take 1 capsule by mouth at bedtime., Disp: , Rfl:    Tiotropium Bromide Monohydrate  (SPIRIVA  RESPIMAT) 2.5 MCG/ACT AERS, Inhale 2 puffs into the lungs daily., Disp: 1 each, Rfl: 6  Observations/Objective: Patient is well-developed, well-nourished in no acute distress.  Resting comfortably in office chair working at home.  Head is normocephalic, atraumatic.  No labored breathing.  Speech is clear and coherent with logical content.  Patient is alert and oriented at baseline.    Assessment and Plan: 1. Suspected UTI (Primary) - nitrofurantoin , macrocrystal-monohydrate, (MACROBID )  100 MG capsule; Take 1 capsule (100 mg total) by mouth 2 (two) times daily for 5 days.  Dispense: 10 capsule; Refill: 0  Start antibiotic today. Finish entire course of antibiotics even if you are feeling better If symptoms are worsening (fever/ chills, or back pain) please be seen at urgent care or the emergency room right away. Symptoms should gradually improve over the next few days. Drink plenty of water to stay well hydrated. Please review the urinary tract infection handout attached in AVS.   Follow Up Instructions: I discussed the assessment and treatment plan with the patient. The patient was provided an opportunity to ask questions and all were answered. The patient agreed with the plan and demonstrated an understanding of the instructions.  A copy of instructions were sent to the patient via MyChart unless otherwise noted below.   The patient was advised to call back or seek an in-person evaluation if the symptoms worsen or if the condition fails to improve as anticipated.    Olam [name], FNP

## 2023-09-21 LAB — LIPID PANEL

## 2023-09-22 LAB — LIPID PANEL
Cholesterol, Total: 140 mg/dL (ref 100–199)
HDL: 47 mg/dL (ref >39)
LDL CALC COMMENT:: 3 ratio (ref 0.0–4.4)
LDL Chol Calc (NIH): 71 mg/dL (ref 0–99)
Triglycerides: 121 mg/dL (ref 0–149)
VLDL Cholesterol Cal: 22 mg/dL (ref 5–40)

## 2023-09-22 LAB — COMPREHENSIVE METABOLIC PANEL WITH GFR
ALT: 8 IU/L (ref 0–32)
AST: 11 IU/L (ref 0–40)
Albumin: 3.9 g/dL (ref 3.9–4.9)
Alkaline Phosphatase: 95 IU/L (ref 44–121)
BUN/Creatinine Ratio: 11 — ABNORMAL LOW (ref 12–28)
BUN: 9 mg/dL (ref 8–27)
Bilirubin Total: 0.6 mg/dL (ref 0.0–1.2)
CO2: 23 mmol/L (ref 20–29)
Calcium: 9.4 mg/dL (ref 8.7–10.3)
Chloride: 104 mmol/L (ref 96–106)
Creatinine, Ser: 0.82 mg/dL (ref 0.57–1.00)
Globulin, Total: 2.7 g/dL (ref 1.5–4.5)
Glucose: 101 mg/dL — ABNORMAL HIGH (ref 70–99)
Potassium: 4 mmol/L (ref 3.5–5.2)
Sodium: 142 mmol/L (ref 134–144)
Total Protein: 6.6 g/dL (ref 6.0–8.5)
eGFR: 80 mL/min/1.73 (ref >59)

## 2023-09-23 ENCOUNTER — Ambulatory Visit: Payer: Self-pay | Admitting: Cardiology

## 2023-10-05 ENCOUNTER — Ambulatory Visit (INDEPENDENT_AMBULATORY_CARE_PROVIDER_SITE_OTHER): Admitting: Family Medicine

## 2023-10-05 ENCOUNTER — Encounter: Payer: Self-pay | Admitting: Family Medicine

## 2023-10-05 VITALS — BP 140/80 | HR 65 | Ht 64.0 in | Wt 178.4 lb

## 2023-10-05 DIAGNOSIS — Z720 Tobacco use: Secondary | ICD-10-CM

## 2023-10-05 DIAGNOSIS — J4489 Other specified chronic obstructive pulmonary disease: Secondary | ICD-10-CM | POA: Diagnosis not present

## 2023-10-05 MED ORDER — SPIRIVA RESPIMAT 2.5 MCG/ACT IN AERS: 2.0000 | INHALATION_SPRAY | Freq: Every day | RESPIRATORY_TRACT | 6 refills | Status: DC

## 2023-10-05 NOTE — Progress Notes (Signed)
 Established Patient Office Visit  Subjective    Patient ID: [name], female    DOB: 07-31-1960  Age: 63 y.o. MRN: [id number]  CC:  Chief Complaint  Patient presents with   Medical Management of Chronic Issues    Pt reports she is out of her breathing medication. Also reports a pull / soreness under her left ribcage. Pt would like to discuss the best way to quit smoking     HPI [name] presents for follow up of COPD and desiring to stop smoking.   Outpatient Encounter Medications as of 10/05/2023  Medication Sig   carvedilol  (COREG ) 6.25 MG tablet Take 1 tablet (6.25 mg total) by mouth 2 (two) times daily.   cetirizine  (ZYRTEC  ALLERGY) 10 MG tablet Take 1 tablet (10 mg total) by mouth daily.   Cyanocobalamin (VITAMIN B-12 PO) Take 1 tablet by mouth 3 (three) times daily.   Evolocumab  (REPATHA  SURECLICK) 140 MG/ML SOAJ Inject 140 mg into the skin every 14 (fourteen) days.   ferrous sulfate  324 (65 Fe) MG TBEC Take 1 tablet (324 mg total) by mouth every other day.   losartan  (COZAAR ) 50 MG tablet Take 1 tablet (50 mg total) by mouth in the morning.   meclizine  (ANTIVERT ) 50 MG tablet Take 1 tablet (50 mg total) by mouth 3 (three) times daily as needed.   polyethylene glycol (MIRALAX  / GLYCOLAX ) 17 g packet Take 17 g by mouth daily as needed.   Probiotic Product (PROBIOTIC PO) Take 1 capsule by mouth at bedtime.   [DISCONTINUED] Tiotropium Bromide Monohydrate  (SPIRIVA  RESPIMAT) 2.5 MCG/ACT AERS Inhale 2 puffs into the lungs daily.   Tiotropium Bromide Monohydrate  (SPIRIVA  RESPIMAT) 2.5 MCG/ACT AERS Inhale 2 puffs into the lungs daily.   No facility-administered encounter medications on file as of 10/05/2023.    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    Hyperlipidemia    Hypertension    Seasonal allergies    Sickle cell trait (HCC)    Stroke (HCC) 2013    Past Surgical History:  Procedure Laterality Date   ABDOMINAL HYSTERECTOMY     ESOPHAGOGASTRODUODENOSCOPY (EGD) WITH PROPOFOL  N/A 04/29/2022   Procedure: ESOPHAGOGASTRODUODENOSCOPY (EGD) WITH PROPOFOL ;  Surgeon: Leigh Elspeth SQUIBB, MD;  Location: [facility] ENDOSCOPY;  Service: Gastroenterology;  Laterality: N/A;   HEMOSTASIS CLIP PLACEMENT  04/29/2022   Procedure: HEMOSTASIS CLIP PLACEMENT;  Surgeon: Leigh Elspeth SQUIBB, MD;  Location: [facility] ENDOSCOPY;  Service: Gastroenterology;;   HOT HEMOSTASIS N/A 04/29/2022   Procedure: HOT HEMOSTASIS (ARGON PLASMA COAGULATION/BICAP);  Surgeon: Leigh Elspeth SQUIBB, MD;  Location: [facility] ENDOSCOPY;  Service: Gastroenterology;  Laterality: N/A;    Family History  Problem Relation Age of Onset   Hyperlipidemia Mother    Hypertension Mother    Diabetes Mother    Renal Disease Mother    Congestive Heart Failure Mother    Hypertension Maternal Grandfather    Diabetes Maternal Grandfather    Heart disease Paternal Grandmother        died from mycardial infarcation   Colon polyps Daughter 1   Colon cancer Neg Hx    Esophageal cancer Neg Hx    Stomach cancer Neg Hx    Rectal cancer Neg Hx     Social History   Socioeconomic History   Marital status: Single  Spouse name: Not on file   Number of children: 1   Years of education: Not on file   Highest education level: Not on file  Occupational History   Not on file  Tobacco Use   Smoking status: Every Day    Types: Cigarettes    Passive exposure: Past   Smokeless tobacco: Never   Tobacco comments:    Quit in 2010 for 3 months - Quit 06/2022  Vaping Use   Vaping status: Never Used  Substance and Sexual Activity   Alcohol use: Not Currently    Comment: OCCASIONAL   Drug use: No   Sexual activity: Yes    Birth control/protection: Surgical, Condom  Other Topics Concern   Not on file  Social  History Narrative   Financial assistance approved for 100% discount at [facility] and has [facility] card per [employer]   03/14/2010         Social Drivers of Health   Financial Resource Strain: High Risk (08/28/2021)   Overall Financial Resource Strain (CARDIA)    Difficulty of Paying Living Expenses: Very hard  Food Insecurity: No Food Insecurity (04/29/2022)   Hunger Vital Sign    Worried About Running Out of Food in the Last Year: Never true    Ran Out of Food in the Last Year: Never true  Transportation Needs: No Transportation Needs (04/29/2022)   PRAPARE - Administrator, Civil Service (Medical): No    Lack of Transportation (Non-Medical): No  Physical Activity: Not on file  Stress: Not on file  Social Connections: Not on file  Intimate Partner Violence: Not At Risk (04/29/2022)   Humiliation, Afraid, Rape, and Kick questionnaire    Fear of Current or Ex-Partner: No    Emotionally Abused: No    Physically Abused: No    Sexually Abused: No    Review of Systems  All other systems reviewed and are negative.       Objective    BP (!) 140/80   Pulse 65   Ht 5' 4 (1.626 m)   Wt 178 lb 6.4 oz (80.9 kg)   SpO2 95%   BMI 30.62 kg/m   Physical Exam Vitals and nursing note reviewed.  Constitutional:      General: She is not in acute distress. Cardiovascular:     Rate and Rhythm: Normal rate and regular rhythm.  Pulmonary:     Effort: Pulmonary effort is normal.     Breath sounds: Normal breath sounds.  Abdominal:     Palpations: Abdomen is soft.     Tenderness: There is no abdominal tenderness.  Neurological:     General: No focal deficit present.     Mental Status: She is alert and oriented to person, place, and time.         Assessment & Plan:   COPD with asthma (HCC) -     Spiriva  Respimat; Inhale 2 puffs into the lungs daily.  Dispense: 1 each; Refill: 6  Tobacco use   Discussed tobacco cessation/reduction at length  Return in about 4 months  (around 02/04/2024).   Tanda Raguel SQUIBB, MD

## 2023-10-08 ENCOUNTER — Encounter: Payer: Self-pay | Admitting: Family Medicine

## 2023-11-03 ENCOUNTER — Other Ambulatory Visit: Payer: Self-pay | Admitting: Family Medicine

## 2023-11-03 DIAGNOSIS — J4489 Other specified chronic obstructive pulmonary disease: Secondary | ICD-10-CM

## 2023-11-03 NOTE — Telephone Encounter (Unsigned)
 Copied from CRM [id number]. Topic: Clinical - Medication Refill >> Nov 03, 2023 11:20 AM Delon DASEN wrote: Medication: Tiotropium Bromide Monohydrate  (SPIRIVA  RESPIMAT) 2.5 MCG/ACT AERS  Has the patient contacted their pharmacy? No (Agent: If no, request that the patient contact the pharmacy for the refill. If patient does not wish to contact the pharmacy document the reason why and proceed with request.) (Agent: If yes, when and what did the pharmacy advise?)  This is the patient's preferred pharmacy:  [facility] [address] ([address] [place] - [address] [place] ([place]) [place] [address] Phone: [phone] Fax: [phone]    Is this the correct pharmacy for this prescription? Yes If no, delete pharmacy and type the correct one.   Has the prescription been filled recently? Yes  Is the patient out of the medication? No  Has the patient been seen for an appointment in the last year OR does the patient have an upcoming appointment? Yes  Can we respond through MyChart? Yes  Agent: Please be advised that Rx refills may take up to 3 business days. We ask that you follow-up with your pharmacy.

## 2023-11-04 MED ORDER — SPIRIVA RESPIMAT 2.5 MCG/ACT IN AERS: 2.0000 | INHALATION_SPRAY | Freq: Every day | RESPIRATORY_TRACT | 6 refills | Status: AC

## 2023-11-04 NOTE — Telephone Encounter (Signed)
 Requested Prescriptions  Pending Prescriptions Disp Refills   Tiotropium Bromide Monohydrate  (SPIRIVA  RESPIMAT) 2.5 MCG/ACT AERS 1 each 6    Sig: Inhale 2 puffs into the lungs daily.     Pulmonology:  Anticholinergic Agents Passed - 11/04/2023  2:05 PM      Passed - Valid encounter within last 12 months    Recent Outpatient Visits           1 month ago COPD with asthma (HCC)   [facility], [place]   4 months ago Benign hypertension   [facility], MD   9 years ago Flu-like symptoms   [facility] Care at [facility], [place], [place]   10 years ago Body aches   [facility] at [facility], [place], [place]

## 2023-12-09 ENCOUNTER — Encounter: Payer: Self-pay | Admitting: Cardiology

## 2023-12-25 ENCOUNTER — Other Ambulatory Visit: Payer: Self-pay | Admitting: Family Medicine

## 2023-12-25 DIAGNOSIS — Z1231 Encounter for screening mammogram for malignant neoplasm of breast: Secondary | ICD-10-CM

## 2024-01-19 ENCOUNTER — Ambulatory Visit

## 2024-02-03 ENCOUNTER — Encounter: Payer: Self-pay | Admitting: Cardiology

## 2024-02-16 ENCOUNTER — Ambulatory Visit
Admission: RE | Admit: 2024-02-16 | Discharge: 2024-02-16 | Disposition: A | Discharge status: Home / Self Care | Source: Ambulatory Visit | Attending: Family Medicine | Admitting: Family Medicine

## 2024-02-16 DIAGNOSIS — Z1231 Encounter for screening mammogram for malignant neoplasm of breast: Secondary | ICD-10-CM

## 2024-02-26 ENCOUNTER — Ambulatory Visit: Payer: Self-pay | Admitting: Family

## 2024-03-09 ENCOUNTER — Ambulatory Visit (INDEPENDENT_AMBULATORY_CARE_PROVIDER_SITE_OTHER): Admitting: Family

## 2024-03-09 ENCOUNTER — Ambulatory Visit: Payer: Self-pay

## 2024-03-09 ENCOUNTER — Other Ambulatory Visit (HOSPITAL_COMMUNITY)
Admission: RE | Admit: 2024-03-09 | Discharge: 2024-03-09 | Disposition: A | Discharge status: Home / Self Care | Source: Ambulatory Visit | Attending: Family | Admitting: Family

## 2024-03-09 ENCOUNTER — Ambulatory Visit (INDEPENDENT_AMBULATORY_CARE_PROVIDER_SITE_OTHER)

## 2024-03-09 ENCOUNTER — Ambulatory Visit: Payer: Self-pay | Admitting: Family

## 2024-03-09 VITALS — BP 150/98 | HR 63 | Resp 16 | Ht 65.0 in | Wt 181.0 lb

## 2024-03-09 DIAGNOSIS — M545 Low back pain, unspecified: Secondary | ICD-10-CM | POA: Diagnosis not present

## 2024-03-09 DIAGNOSIS — I10 Essential (primary) hypertension: Secondary | ICD-10-CM | POA: Diagnosis not present

## 2024-03-09 DIAGNOSIS — N3001 Acute cystitis with hematuria: Secondary | ICD-10-CM

## 2024-03-09 LAB — POCT URINALYSIS DIP (CLINITEK)
Bilirubin, UA: NEGATIVE
Glucose, UA: NEGATIVE mg/dL
Ketones, POC UA: NEGATIVE mg/dL
Nitrite, UA: POSITIVE — AB
POC PROTEIN,UA: NEGATIVE
Spec Grav, UA: 1.02
Urobilinogen, UA: 2 U/dL — AB
pH, UA: 7

## 2024-03-09 MED ORDER — NITROFURANTOIN MONOHYD MACRO 100 MG PO CAPS: 100.0000 mg | ORAL_CAPSULE | Freq: Two times a day (BID) | ORAL | 0 refills | Status: AC

## 2024-03-09 MED ORDER — TRIAMCINOLONE ACETONIDE 40 MG/ML IJ SUSP
40.0000 mg | Freq: Once | INTRAMUSCULAR | Status: AC
  Administered 2024-03-09: 40 mg via INTRAMUSCULAR

## 2024-03-09 NOTE — Telephone Encounter (Signed)
 First attempt to contact patient for triage. LVM for patient to return call to [phone]. Placed back in TQ for additional attempts  Message from Zy'onna H sent at 03/09/2024 12:06 PM EST  Reason for Triage: Patient called in describing the following symptoms: - Slight discomfort when walking - Feels like she may've pulled a muscle in her back. Began yesterday.  Please Advise patient is looking to schedule appt with PCP within the next few days or so.

## 2024-03-09 NOTE — Telephone Encounter (Signed)
 FYI Only or Action Required?: FYI only for provider: appointment scheduled on 03/09/24.  Patient was last seen in primary care on 10/05/2023 by Tanda Bleacher, MD.  Called Nurse Triage reporting Back Pain.  Symptoms began yesterday.  Interventions attempted: Nothing.  Symptoms are: unchanged.  Triage Disposition: See PCP When Office is Open (Within 3 Days)  Patient/caregiver understands and will follow disposition?: Yes          First attempt to contact patient for triage. LVM for patient to return call to [phone]. Placed back in TQ for additional attempts   Message from Zy'onna H sent at 03/09/2024 12:06 PM EST    Reason for Triage: Patient called in describing the following symptoms: - Slight discomfort when walking - Feels like she may've pulled a muscle in her back. Began yesterday.     Reason for Disposition  [1] MODERATE back pain (e.g., interferes with normal activities) AND [2] present > 3 days  Answer Assessment - Initial Assessment Questions 1. ONSET: When did the pain begin? (e.g., minutes, hours, days)     Yesterday   2. LOCATION: Where does it hurt? (upper, mid or lower back)     Back-mid, below rib cage BIL  3. SEVERITY: How bad is the pain?  (e.g., Scale 1-10; mild, moderate, or severe)     Moderate   4. PATTERN: Is the pain constant? (e.g., yes, no; constant, intermittent)      Constant with walking   5. RADIATION: Does the pain shoot into your legs or somewhere else?     No   6. CAUSE:  What do you think is causing the back pain?      Unsure, possible pulled muscle   7. BACK OVERUSE:  Any recent lifting of heavy objects, strenuous work or exercise?     No   8. MEDICINES: What have you taken so far for the pain? (e.g., nothing, acetaminophen , NSAIDS)       9. NEUROLOGIC SYMPTOMS: Do you have any weakness, numbness, or problems with bowel/bladder control?     No   10. OTHER SYMPTOMS: Do you have any other  symptoms? (e.g., fever, abdomen pain, burning with urination, blood in urine)        No     Patient called in to triage with complaints of back pain. This has been ongoing for one day. The patient stated it may be a possible pulled muscle. However she denies any heavy lifting or strenuous activity.    For home care, the patient is not taking any medication.   Appointment scheduled for further evaluation; Patient agrees with the plan of care, and will reach out if symptoms worsen or persist.  Protocols used: Back Pain-A-AH

## 2024-03-09 NOTE — Progress Notes (Signed)
 "   Patient ID: [name], female    DOB: February 10, 1961  MRN: [id number]  CC: Back Pain  Subjective: [name] is a 64 y.o. female who presents for back pain.   Her concerns today include:  - Lower back pain radiating to bilateral legs began 1 day ago. Denies recent trauma/injury and red flag symptoms. States she does not prefer over-the-counter medications. States she has tried massage to help. States she would like to try an injection today in office to see if this helps.  - Blood pressure elevated today in office. She does not complain of red flag symptoms such as but not limited to chest pain, shortness of breath, worst headache of life, nausea/vomiting.   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     Medications Ordered Prior to Encounter[1]  Allergies[2]  Social History   Socioeconomic History   Marital status: Single    Spouse name: Not on file   Number of children: 1   Years of education: Not on file   Highest education level: Not on file  Occupational History   Not on file  Tobacco Use   Smoking status: Every Day    Types: Cigarettes    Passive exposure: Past   Smokeless tobacco: Never   Tobacco comments:    Quit in 2010 for 3 months - Quit 06/2022  Vaping Use   Vaping status: Never Used  Substance and Sexual Activity   Alcohol use: Not Currently    Comment: OCCASIONAL   Drug use: No   Sexual activity: Yes    Birth control/protection: Surgical, Condom  Other Topics Concern   Not  on file  Social History Narrative   Financial assistance approved for 100% discount at [facility] and has [facility] card per [employer]   03/14/2010         Social Drivers of Health   Tobacco Use: High Risk (10/08/2023)   Patient History    Smoking Tobacco Use: Every Day    Smokeless Tobacco Use: Never    Passive Exposure: Past  Financial Resource Strain: High Risk (08/28/2021)   Overall Financial Resource Strain (CARDIA)    Difficulty of Paying Living Expenses: Very hard  Food Insecurity: No Food Insecurity (04/29/2022)   Hunger Vital Sign    Worried About Running Out of Food in the Last Year: Never true    Ran Out of Food in the Last Year: Never true  Transportation Needs: No Transportation Needs (04/29/2022)   PRAPARE - Administrator, Civil Service (Medical): No    Lack of Transportation (Non-Medical): No  Physical Activity: Not on file  Stress: Not on file  Social Connections: Not on file  Intimate Partner Violence: Not At Risk (04/29/2022)   Humiliation, Afraid, Rape, and Kick questionnaire    Fear of Current or Ex-Partner: No    Emotionally Abused: No    Physically Abused: No    Sexually Abused: No  Depression (PHQ2-9): Low Risk (03/09/2024)   Depression (PHQ2-9)    PHQ-2 Score: 0  Alcohol Screen:  Not on file  Housing: Low Risk (04/29/2022)   Housing    Last Housing Risk Score: 0  Utilities: Not At Risk (04/29/2022)   AHC Utilities    Threatened with loss of utilities: No  Health Literacy: Not on file    Family History  Problem Relation Age of Onset   Hyperlipidemia Mother    Hypertension Mother    Diabetes Mother    Renal Disease Mother    Congestive Heart Failure Mother    Hypertension Maternal Grandfather    Diabetes Maternal Grandfather    Heart disease Paternal Grandmother        died from mycardial infarcation   Colon polyps Daughter 58   Colon cancer Neg Hx    Esophageal cancer Neg Hx    Stomach cancer Neg Hx    Rectal cancer Neg Hx     Past Surgical  History:  Procedure Laterality Date   ABDOMINAL HYSTERECTOMY     ESOPHAGOGASTRODUODENOSCOPY (EGD) WITH PROPOFOL  N/A 04/29/2022   Procedure: ESOPHAGOGASTRODUODENOSCOPY (EGD) WITH PROPOFOL ;  Surgeon: Leigh Elspeth SQUIBB, MD;  Location: [facility] ENDOSCOPY;  Service: Gastroenterology;  Laterality: N/A;   HEMOSTASIS CLIP PLACEMENT  04/29/2022   Procedure: HEMOSTASIS CLIP PLACEMENT;  Surgeon: Leigh Elspeth SQUIBB, MD;  Location: [facility] ENDOSCOPY;  Service: Gastroenterology;;   HOT HEMOSTASIS N/A 04/29/2022   Procedure: HOT HEMOSTASIS (ARGON PLASMA COAGULATION/BICAP);  Surgeon: Leigh Elspeth SQUIBB, MD;  Location: [facility] ENDOSCOPY;  Service: Gastroenterology;  Laterality: N/A;    ROS: Review of Systems Negative except as stated above  PHYSICAL EXAM: BP (!) 150/98   Pulse 63   Resp 16   Ht 5' 5 (1.651 m)   Wt 181 lb (82.1 kg)   SpO2 93%   BMI 30.12 kg/m   Physical Exam HENT:     Head: Normocephalic and atraumatic.     Nose: Nose normal.     Mouth/Throat:     Mouth: Mucous membranes are moist.     Pharynx: Oropharynx is clear.  Eyes:     Extraocular Movements: Extraocular movements intact.     Conjunctiva/sclera: Conjunctivae normal.     Pupils: Pupils are equal, round, and reactive to light.  Cardiovascular:     Rate and Rhythm: Normal rate and regular rhythm.     Pulses: Normal pulses.     Heart sounds: Normal heart sounds.  Pulmonary:     Effort: Pulmonary effort is normal.     Breath sounds: Normal breath sounds.  Musculoskeletal:        General: Normal range of motion.     Right shoulder: Normal.     Left shoulder: Normal.     Right upper arm: Normal.     Left upper arm: Normal.     Right elbow: Normal.     Left elbow: Normal.     Right forearm: Normal.     Left forearm: Normal.     Right wrist: Normal.     Left wrist: Normal.     Right hand: Normal.     Left hand: Normal.     Cervical back: Normal, normal range of motion and neck supple.     Thoracic back: Normal.     Lumbar  back: Tenderness present.     Right hip: Normal.     Left hip: Normal.     Right upper leg: Normal.     Left upper leg: Normal.     Right knee: Normal.     Left knee: Normal.  Right lower leg: Normal.     Left lower leg: Normal.     Right ankle: Normal.     Left ankle: Normal.     Right foot: Normal.     Left foot: Normal.  Neurological:     General: No focal deficit present.     Mental Status: She is alert and oriented to person, place, and time.  Psychiatric:        Mood and Affect: Mood normal.        Behavior: Behavior normal.     ASSESSMENT AND PLAN: 1. Low back pain, unspecified back pain laterality, unspecified chronicity, unspecified whether sciatica present (Primary) - Triamcinolone  Acetonide injection administered in office.  - DG Lumbar Spine for evaluation.  - Routine screening.  - Follow-up with primary provider as scheduled.  - DG Lumbar Spine Complete; Future - triamcinolone  acetonide (KENALOG -40) injection 40 mg - Basic Metabolic Panel - Cervicovaginal ancillary only - POCT URINALYSIS DIP (CLINITEK); Future  2. Primary hypertension - Blood pressure not at goal during today's visit. Patient asymptomatic without chest pressure, chest pain, palpitations, shortness of breath, worst headache of life, and any additional red flag symptoms. - Elevated blood pressure may be secondary to back pain (see #1).  - Continue present management.  - Counseled on blood pressure goal of less than 130/80, low-sodium, DASH diet, medication compliance, and 150 minutes of moderate intensity exercise per week as tolerated. Counseled on medication adherence and adverse effects. - Follow-up with primary provider in 2 weeks or sooner if needed.    Patient was given the opportunity to ask questions.  Patient verbalized understanding of the plan and was able to repeat key elements of the plan. Patient was given clear instructions to go to Emergency Department or return to medical  center if symptoms don't improve, worsen, or new problems develop.The patient verbalized understanding.   Orders Placed This Encounter  Procedures   DG Lumbar Spine Complete   Basic Metabolic Panel   POCT URINALYSIS DIP (CLINITEK)    Return in 2 weeks (on 03/23/2024) for Follow-Up or next available with Raguel Blush, MD.  Greig JINNY Chute, NP      [1]  Current Outpatient Medications on File Prior to Visit  Medication Sig Dispense Refill   carvedilol  (COREG ) 6.25 MG tablet Take 1 tablet (6.25 mg total) by mouth 2 (two) times daily. 180 tablet 3   cetirizine  (ZYRTEC  ALLERGY) 10 MG tablet Take 1 tablet (10 mg total) by mouth daily. 30 tablet 0   Cyanocobalamin (VITAMIN B-12 PO) Take 1 tablet by mouth 3 (three) times daily.     Evolocumab  (REPATHA  SURECLICK) 140 MG/ML SOAJ Inject 140 mg into the skin every 14 (fourteen) days. 2 mL 5   ferrous sulfate  324 (65 Fe) MG TBEC Take 1 tablet (324 mg total) by mouth every other day. 30 tablet 3   losartan  (COZAAR ) 50 MG tablet Take 1 tablet (50 mg total) by mouth in the morning. 90 tablet 3   meclizine  (ANTIVERT ) 50 MG tablet Take 1 tablet (50 mg total) by mouth 3 (three) times daily as needed. 30 tablet 0   polyethylene glycol (MIRALAX  / GLYCOLAX ) 17 g packet Take 17 g by mouth daily as needed. 14 each 0   Probiotic Product (PROBIOTIC PO) Take 1 capsule by mouth at bedtime.     Tiotropium Bromide Monohydrate  (SPIRIVA  RESPIMAT) 2.5 MCG/ACT AERS Inhale 2 puffs into the lungs daily. 1 each 6   No current facility-administered medications on file  prior to visit.  [2]  Allergies Allergen Reactions   Asa [Aspirin ] Nausea Only, Palpitations and Other (See Comments)    GI upset Tremors Reactions to 325mg  dose   Crestor  [Rosuvastatin ] Other (See Comments)    Myalgias    Flagyl [Metronidazole] Other (See Comments)    Yeast infections   Gluten Meal Nausea And Vomiting and Other (See Comments)    Bloating Flatulence   Milk-Related Compounds Nausea  And Vomiting and Other (See Comments)    Bloating Gas   Chantix  [Varenicline ] Other (See Comments)    Dizziness   Egg Protein-Containing Drug Products Nausea Only   Fish Allergy Hives, Itching and Rash    Specifically tuna   Zocor [Simvastatin] Rash   "

## 2024-03-10 LAB — BASIC METABOLIC PANEL WITH GFR
BUN/Creatinine Ratio: 11 — ABNORMAL LOW (ref 12–28)
BUN: 8 mg/dL (ref 8–27)
CO2: 21 mmol/L (ref 20–29)
Calcium: 9.9 mg/dL (ref 8.7–10.3)
Chloride: 105 mmol/L (ref 96–106)
Creatinine, Ser: 0.72 mg/dL (ref 0.57–1.00)
Glucose: 73 mg/dL (ref 70–99)
Potassium: 4.2 mmol/L (ref 3.5–5.2)
Sodium: 144 mmol/L (ref 134–144)
eGFR: 93 mL/min/1.73

## 2024-03-11 LAB — CERVICOVAGINAL ANCILLARY ONLY
Comment: NEGATIVE
Comment: NEGATIVE
Comment: NEGATIVE
Comment: NEGATIVE
Comment: NEGATIVE
Comment: NORMAL

## 2024-03-16 ENCOUNTER — Telehealth: Payer: Self-pay | Admitting: Family Medicine

## 2024-03-16 NOTE — Telephone Encounter (Signed)
 Called pt and left vm to call office back to reschedule appt, per pt request

## 2024-03-18 ENCOUNTER — Other Ambulatory Visit: Payer: Self-pay | Admitting: Cardiology

## 2024-03-18 DIAGNOSIS — G720 Drug-induced myopathy: Secondary | ICD-10-CM

## 2024-03-23 ENCOUNTER — Ambulatory Visit: Payer: Self-pay | Admitting: Family Medicine

## 2024-04-27 ENCOUNTER — Ambulatory Visit: Admitting: Family Medicine
